# Patient Record
Sex: Female | Born: 1935 | Race: White | Hispanic: No | Marital: Single | Smoking: Never smoker
Health system: Southern US, Community
[De-identification: ages and names within clinical notes are randomized; demographics above are authoritative.]

## PROBLEM LIST (undated history)

## (undated) DIAGNOSIS — Z87442 Personal history of urinary calculi: Secondary | ICD-10-CM

## (undated) DIAGNOSIS — I10 Essential (primary) hypertension: Secondary | ICD-10-CM

## (undated) DIAGNOSIS — I7123 Aneurysm of the descending thoracic aorta, without rupture: Secondary | ICD-10-CM

## (undated) DIAGNOSIS — I82409 Acute embolism and thrombosis of unspecified deep veins of unspecified lower extremity: Secondary | ICD-10-CM

## (undated) DIAGNOSIS — I639 Cerebral infarction, unspecified: Secondary | ICD-10-CM

## (undated) DIAGNOSIS — M1990 Unspecified osteoarthritis, unspecified site: Secondary | ICD-10-CM

## (undated) DIAGNOSIS — I712 Thoracic aortic aneurysm, without rupture: Secondary | ICD-10-CM

## (undated) DIAGNOSIS — Q619 Cystic kidney disease, unspecified: Secondary | ICD-10-CM

## (undated) DIAGNOSIS — E785 Hyperlipidemia, unspecified: Secondary | ICD-10-CM

## (undated) HISTORY — DX: Cerebral infarction, unspecified: I63.9

## (undated) HISTORY — DX: Thoracic aortic aneurysm, without rupture: I71.2

## (undated) HISTORY — DX: Aneurysm of the descending thoracic aorta, without rupture: I71.23

## (undated) HISTORY — PX: JOINT REPLACEMENT: SHX530

## (undated) HISTORY — DX: Cystic kidney disease, unspecified: Q61.9

## (undated) HISTORY — PX: TONSILLECTOMY: SUR1361

## (undated) HISTORY — DX: Acute embolism and thrombosis of unspecified deep veins of unspecified lower extremity: I82.409

## (undated) HISTORY — DX: Essential (primary) hypertension: I10

## (undated) HISTORY — DX: Hyperlipidemia, unspecified: E78.5

## (undated) HISTORY — DX: Personal history of urinary calculi: Z87.442

---

## 1999-03-14 ENCOUNTER — Encounter: Admission: RE | Admit: 1999-03-14 | Discharge: 1999-03-14 | Payer: Self-pay | Admitting: Cardiothoracic Surgery

## 1999-03-14 ENCOUNTER — Encounter: Payer: Self-pay | Admitting: Cardiothoracic Surgery

## 1999-11-27 ENCOUNTER — Other Ambulatory Visit: Admission: RE | Admit: 1999-11-27 | Discharge: 1999-11-27 | Payer: Self-pay | Admitting: Family Medicine

## 2000-03-12 ENCOUNTER — Encounter: Payer: Self-pay | Admitting: Cardiothoracic Surgery

## 2000-03-12 ENCOUNTER — Encounter: Admission: RE | Admit: 2000-03-12 | Discharge: 2000-03-12 | Payer: Self-pay | Admitting: Cardiothoracic Surgery

## 2000-12-06 ENCOUNTER — Other Ambulatory Visit: Admission: RE | Admit: 2000-12-06 | Discharge: 2000-12-06 | Payer: Self-pay | Admitting: Family Medicine

## 2001-03-18 ENCOUNTER — Encounter: Payer: Self-pay | Admitting: Cardiothoracic Surgery

## 2001-03-18 ENCOUNTER — Encounter: Admission: RE | Admit: 2001-03-18 | Discharge: 2001-03-18 | Payer: Self-pay | Admitting: Cardiothoracic Surgery

## 2001-05-30 ENCOUNTER — Encounter: Payer: Self-pay | Admitting: Family Medicine

## 2001-05-30 ENCOUNTER — Ambulatory Visit: Admission: RE | Admit: 2001-05-30 | Discharge: 2001-05-30 | Payer: Self-pay | Admitting: Family Medicine

## 2001-12-15 ENCOUNTER — Other Ambulatory Visit: Admission: RE | Admit: 2001-12-15 | Discharge: 2001-12-15 | Payer: Self-pay | Admitting: Family Medicine

## 2002-02-27 ENCOUNTER — Encounter: Payer: Self-pay | Admitting: Emergency Medicine

## 2002-02-27 ENCOUNTER — Emergency Department (HOSPITAL_COMMUNITY): Admission: EM | Admit: 2002-02-27 | Discharge: 2002-02-27 | Payer: Self-pay | Admitting: Emergency Medicine

## 2002-03-24 ENCOUNTER — Ambulatory Visit (HOSPITAL_COMMUNITY): Admission: RE | Admit: 2002-03-24 | Discharge: 2002-03-24 | Payer: Self-pay | Admitting: Cardiothoracic Surgery

## 2002-03-24 ENCOUNTER — Encounter: Payer: Self-pay | Admitting: Cardiothoracic Surgery

## 2002-12-22 ENCOUNTER — Other Ambulatory Visit: Admission: RE | Admit: 2002-12-22 | Discharge: 2002-12-22 | Payer: Self-pay | Admitting: Family Medicine

## 2003-03-09 ENCOUNTER — Encounter: Admission: RE | Admit: 2003-03-09 | Discharge: 2003-03-09 | Payer: Self-pay | Admitting: Cardiothoracic Surgery

## 2003-12-24 ENCOUNTER — Other Ambulatory Visit: Admission: RE | Admit: 2003-12-24 | Discharge: 2003-12-24 | Payer: Self-pay | Admitting: Family Medicine

## 2004-03-21 ENCOUNTER — Encounter: Admission: RE | Admit: 2004-03-21 | Discharge: 2004-03-21 | Payer: Self-pay | Admitting: Cardiothoracic Surgery

## 2004-12-26 ENCOUNTER — Other Ambulatory Visit: Admission: RE | Admit: 2004-12-26 | Discharge: 2004-12-26 | Payer: Self-pay | Admitting: Family Medicine

## 2005-03-20 ENCOUNTER — Encounter: Admission: RE | Admit: 2005-03-20 | Discharge: 2005-03-20 | Payer: Self-pay | Admitting: Cardiothoracic Surgery

## 2006-04-02 ENCOUNTER — Encounter: Admission: RE | Admit: 2006-04-02 | Discharge: 2006-04-02 | Payer: Self-pay | Admitting: Cardiothoracic Surgery

## 2007-01-11 ENCOUNTER — Other Ambulatory Visit: Admission: RE | Admit: 2007-01-11 | Discharge: 2007-01-11 | Payer: Self-pay | Admitting: Family Medicine

## 2007-03-18 ENCOUNTER — Ambulatory Visit: Payer: Self-pay | Admitting: Cardiothoracic Surgery

## 2007-03-18 ENCOUNTER — Encounter: Admission: RE | Admit: 2007-03-18 | Discharge: 2007-03-18 | Payer: Self-pay | Admitting: Cardiothoracic Surgery

## 2007-11-10 ENCOUNTER — Ambulatory Visit: Payer: Self-pay | Admitting: Internal Medicine

## 2007-11-10 DIAGNOSIS — R21 Rash and other nonspecific skin eruption: Secondary | ICD-10-CM | POA: Insufficient documentation

## 2007-11-10 LAB — CONVERTED CEMR LAB
ALT: 27 units/L (ref 0–35)
AST: 26 units/L (ref 0–37)
Albumin: 4 g/dL (ref 3.5–5.2)
Alkaline Phosphatase: 61 units/L (ref 39–117)
Anti Nuclear Antibody(ANA): NEGATIVE
BUN: 22 mg/dL (ref 6–23)
Basophils Absolute: 0.2 10*3/uL — ABNORMAL HIGH (ref 0.0–0.1)
Basophils Relative: 3.1 % — ABNORMAL HIGH (ref 0.0–1.0)
Bilirubin, Direct: 0.1 mg/dL (ref 0.0–0.3)
CO2: 26 meq/L (ref 19–32)
Calcium: 9.4 mg/dL (ref 8.4–10.5)
Chloride: 105 meq/L (ref 96–112)
Creatinine, Ser: 0.8 mg/dL (ref 0.4–1.2)
Eosinophils Absolute: 0.3 10*3/uL (ref 0.0–0.7)
Eosinophils Relative: 5.6 % — ABNORMAL HIGH (ref 0.0–5.0)
GFR calc Af Amer: 91 mL/min
GFR calc non Af Amer: 75 mL/min
Glucose, Bld: 99 mg/dL (ref 70–99)
HCT: 42.2 % (ref 36.0–46.0)
Hemoglobin: 14.9 g/dL (ref 12.0–15.0)
IgE (Immunoglobulin E), Serum: 7.2 intl units/mL (ref 0.0–180.0)
Lymphocytes Relative: 26.9 % (ref 12.0–46.0)
MCHC: 35.4 g/dL (ref 30.0–36.0)
MCV: 94.1 fL (ref 78.0–100.0)
Monocytes Absolute: 0.4 10*3/uL (ref 0.1–1.0)
Monocytes Relative: 7.1 % (ref 3.0–12.0)
Neutro Abs: 3.6 10*3/uL (ref 1.4–7.7)
Neutrophils Relative %: 57.3 % (ref 43.0–77.0)
Platelets: 279 10*3/uL (ref 150–400)
Potassium: 4.1 meq/L (ref 3.5–5.1)
RBC: 4.48 M/uL (ref 3.87–5.11)
RDW: 12.3 % (ref 11.5–14.6)
Rheumatoid fact SerPl-aCnc: <20 intl units/mL — ABNORMAL LOW (ref 0.0–20.0)
Sed Rate: 6 mm/hr (ref 0–22)
Sodium: 141 meq/L (ref 135–145)
TSH: 1.23 microintl units/mL (ref 0.35–5.50)
Total Bilirubin: 1.1 mg/dL (ref 0.3–1.2)
Total Protein: 6.8 g/dL (ref 6.0–8.3)
WBC: 6.1 10*3/uL (ref 4.5–10.5)

## 2007-11-17 DIAGNOSIS — I10 Essential (primary) hypertension: Secondary | ICD-10-CM | POA: Insufficient documentation

## 2007-11-17 DIAGNOSIS — E785 Hyperlipidemia, unspecified: Secondary | ICD-10-CM | POA: Insufficient documentation

## 2007-11-17 DIAGNOSIS — J984 Other disorders of lung: Secondary | ICD-10-CM | POA: Insufficient documentation

## 2007-12-01 ENCOUNTER — Ambulatory Visit: Payer: Self-pay | Admitting: Internal Medicine

## 2007-12-01 LAB — CONVERTED CEMR LAB: IgE (Immunoglobulin E), Serum: 7.1 intl units/mL (ref 0.0–180.0)

## 2008-01-02 ENCOUNTER — Ambulatory Visit: Payer: Self-pay | Admitting: Internal Medicine

## 2008-01-07 ENCOUNTER — Emergency Department (HOSPITAL_COMMUNITY): Admission: EM | Admit: 2008-01-07 | Discharge: 2008-01-07 | Payer: Self-pay | Admitting: Emergency Medicine

## 2008-03-15 ENCOUNTER — Ambulatory Visit: Payer: Self-pay | Admitting: Internal Medicine

## 2008-03-30 ENCOUNTER — Encounter: Admission: RE | Admit: 2008-03-30 | Discharge: 2008-03-30 | Payer: Self-pay | Admitting: Family Medicine

## 2008-05-03 ENCOUNTER — Ambulatory Visit: Payer: Self-pay | Admitting: Internal Medicine

## 2008-11-21 ENCOUNTER — Encounter: Admission: RE | Admit: 2008-11-21 | Discharge: 2008-11-21 | Payer: Self-pay | Admitting: Cardiothoracic Surgery

## 2008-12-06 ENCOUNTER — Ambulatory Visit: Payer: Self-pay | Admitting: Cardiothoracic Surgery

## 2008-12-10 ENCOUNTER — Ambulatory Visit: Payer: Self-pay | Admitting: Surgery

## 2009-11-20 ENCOUNTER — Other Ambulatory Visit: Admission: RE | Admit: 2009-11-20 | Discharge: 2009-11-20 | Payer: Self-pay | Admitting: Family Medicine

## 2010-06-03 NOTE — Assessment & Plan Note (Signed)
Summary: rov 3 wks////kp   Visit Type:  Follow-up PCP:  Elias Else  Chief Complaint:  follow up visit .  History of Present Illness: Current Problems:  HYPERTENSION (ICD-401.9) HYPERLIPIDEMIA (ICD-272.4) RASH AND OTHER NONSPECIFIC SKIN ERUPTION (ICD-782.1) LUNG NODULE (ICD-43.70)  77 year old woman returning for follow-up, history of lung nodule, history of rash.  The rash is improved.  We reviewed her previous experience with dermatologist.  She never had biopsy.  Says pruritus has now moved from her abdomen to her back.  Sample medications given last time here were of no help.  She avoids moisturizers, uses baby detergents, with no fabric dryer sheets.  Sometimes she does much better for months at a time, but does not recognize any seasonal or temperature effect.  We reviewed medications. Labs: EOS incr 5.6%, chem NL, Sed Rate NL, Rheumatoid factor NL, TSH 1.23(OK).       Prior Medications Reviewed Using: Patient Recall  Updated Prior Medication List: NIACIN 250 MG  TABS (NIACIN) take 1 by mouth once daily ONE-A-DAY 50 PLUS   TABS (MULTIPLE VITAMINS-MINERALS) take 1 by mouth once daily CALCIUM 500 MG  TABS (CALCIUM CARBONATE) take 1 by mouth once daily BENICAR 20 MG  TABS (OLMESARTAN MEDOXOMIL) take 1 by mouth once daily COMBIGAN 0.2-0.5 %  SOLN (BRIMONIDINE TARTRATE-TIMOLOL) Use as directed TRAVATAN 0.004 %  SOLN (TRAVOPROST) Use as directed  Current Allergies (reviewed today): ! PCN  Past Medical History:    Reviewed history from 11/10/2007 and no changes required:       Shingles       kidney stones       Hyperlipidemia       seasonal rhinitis       Hypertension     Review of Systems      See HPI   Vital Signs:  Patient Profile:   77 Years Old Female Weight:      128.25 pounds O2 Sat:      100 % O2 treatment:    Room Air Pulse rate:   54 / minute BP sitting:   104 / 60  (left arm) Cuff size:   regular  Vitals Entered By: Reynaldo Minium CMA (December 01, 2007 10:21 AM)                 Physical Exam  GENERAL:  A/Ox3; pleasant & cooperative.NAD HEENT:  [place]/AT, EOM-wnl, PERRLA, EACs-clear, TMs-wnl, NOSE-clear, THROAT-clear & wnl. NECK:  Supple w/ fair ROM; no JVD; normal carotid impulses w/o bruits; no thyromegaly or nodules palpated; no lymphadenopathy. CHEST: Clear to P&A HEART:  RRR, no m/r/g  heard ABDOMEN:  Soft & nt; nml bowel sounds; no organomegaly or masses detected. EXT: Warm bilat,  no calf pain, edema, clubbing, pulses intact Skin:Small areas of erythema and thckening on her back, look excoriated.        Impression & Recommendations:  Problem # 1:  RASH AND OTHER NONSPECIFIC SKIN ERUPTION (ICD-782.1)  Nonspecific rash, possible nummular eczema or atopic dermatitis. Suggested she see a dermatologist. Try Cetaphil lotion.   "RAST" (Allergy Full Profile) IGE ([id number]) T- * Misc. Laboratory test [phone]) "RAST" (Allergy Full Profile) IGE [phone]) T- * Misc. Laboratory test [phone]) "RAST" (Allergy Full Profile) IGE [phone]) T- * Misc. Laboratory test [phone]) "RAST" (Allergy Full Profile) IGE [phone]) T- * Misc. Laboratory test [phone]) "RAST" (Allergy Full Profile) IGE [phone]) T- * Misc. Laboratory test [phone]) "RAST" (Allergy Full Profile) IGE [phone]) T- * Misc. Laboratory test [phone]) "RAST" (Allergy Full Profile)  IGE [phone]) T- * Misc. Laboratory test [phone]) "RAST" (Allergy Full Profile) IGE [phone]) T- * Misc. Laboratory test [phone]) "RAST" (Allergy Full Profile) IGE [phone]) T- * Misc. Laboratory test [phone]) "RAST" (Allergy Full Profile) IGE [phone]) T- * Misc. Laboratory test [phone])   Problem # 2:  LUNG NODULE (ICD-518.89) Dr. Donata Clay is continuing to follow this issue.   Patient Instructions: 1)  Please schedule a follow-up appointment in 1 month. 2)  Labs    ]  Appended Document: medical history update        Current Allergies: !  PCN   Family History:    mother died at 62 from cancer    father was healthy    1 brother who died at 63 from heart and kidney failure  Social History:    Patient never smoked.     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

## 2010-06-03 NOTE — Assessment & Plan Note (Signed)
Summary: rov 1 month///kp   PCP:  Elias Else  Chief Complaint:  1 month follow up visit-broke out again last week; currently still broke out.Marland Kitchen  History of Present Illness: Current Problems:  HYPERTENSION (ICD-401.9) HYPERLIPIDEMIA (ICD-272.4) RASH AND OTHER NONSPECIFIC SKIN ERUPTION (ICD-782.1) LUNG NODULE (ICD-518.89)  03/15/08- From 01/01/08- 77 year old woman returning for follow-up with a skin rash, questioning, allergy.  The lung nodule is being followed by thoracic surgery.  Her eye doctor said she was allergic to her glaucoma drops and made a change.  There is less burning and itching of her eyes now.  Less redness. Lab: In vitro test /RAST was negative.  IgE was 7.1.  These argue against common inhalant allergy.  03/15/08-Rash x 2 yrs. Intermittent, not related to her eye drops. Did not try changing   Benicar. Had flu vax.Denies cough, wheeze, sneeze.  05/03/08- Rash Was better and happy, with rash clearing duing time she was on Diovan. Now back on Benicar about 3 weeks. Rash started again in the last week. Had a cold around 3 weeks ago. Treating dry skin using Aveeno as a body wash.       Prior Medication List:  NIACIN 250 MG  TABS (NIACIN) take 1 by mouth once daily ONE-A-DAY 50 PLUS   TABS (MULTIPLE VITAMINS-MINERALS) take 1 by mouth once daily CALCIUM 500 MG  TABS (CALCIUM CARBONATE) take 1 by mouth once daily BENICAR 20 MG  TABS (OLMESARTAN MEDOXOMIL) take 1 by mouth once daily TRAVATAN 0.004 %  SOLN (TRAVOPROST) Use as directed DORZOLAMIDE-TIMOLOL 2-0.5 % SOLN (DORZOLAMIDE-TIMOLOL) Instill 1 gtt each eye two times a day   Updated Prior Medication List: NIACIN 250 MG  TABS (NIACIN) take 1 by mouth once daily ONE-A-DAY 50 PLUS   TABS (MULTIPLE VITAMINS-MINERALS) take 1 by mouth once daily CALCIUM 500 MG  TABS (CALCIUM CARBONATE) take 1 by mouth once daily BENICAR 20 MG  TABS (OLMESARTAN MEDOXOMIL) take 1 by mouth once daily TRAVATAN 0.004 %  SOLN (TRAVOPROST)  Use as directed DORZOLAMIDE-TIMOLOL 2-0.5 % SOLN (DORZOLAMIDE-TIMOLOL) Instill 1 gtt each eye two times a day  Current Allergies (reviewed today): ! PCN  Past Medical History:    Reviewed history from 03/15/2008 and no changes required:       Shingles       kidney stones       Hyperlipidemia       seasonal rhinitis       Hypertension       Glaucoma       Rash   Social History:    Reviewed history from 12/12/2007 and no changes required:       Patient never smoked.        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     Vital Signs:  Patient Profile:   77 Years Old Female Weight:      122.25 pounds O2 Sat:      100 % O2 treatment:    Room Air Pulse rate:   63 / minute BP sitting:   116 / 62  (left arm) Cuff size:   regular  Vitals Entered By: Reynaldo Minium  CMA (May 03, 2008 10:37 AM)             Comments Medications reviewed with patient Reynaldo Minium CMA  May 03, 2008 10:37 AM      Physical Exam  General: A/Ox3; pleasant and cooperative, NAD, SKIN: Dry skin with excoriation but no additional process visible NODES: no lymphadenopathy HEENT: [place]/AT, EOM- WNL, Conjuctivae- clear, PERRLA, TM-WNL, Nose- clear, Throat- clear and wnl NECK: Supple w/ fair ROM, JVD- none, normal carotid impulses w/o bruits Thyroid- normal to palpation CHEST: Clear to P&A HEART: RRR, no m/g/r heard ABDOMEN: Soft and nl; nml bowel sounds; no organomegaly or masses noted AOZ:HYQM, nl pulses, no edema  NEURO: Grossly intact to observation         Impression & Recommendations:  Problem # 1:  RASH AND OTHER NONSPECIFIC SKIN ERUPTION (ICD-782.1) Some of this is dry skin/ winter itch. Off chance that she is intolerant of Benicar- will change back to Diovan.  Depo.  Problem # 2:  LUNG NODULE (ICD-518.89) Followed by thoracic surgery  Medications Added to Medication List This Visit: 1)  Diovan 160 Mg Tabs (Valsartan) .Marland Kitchen.. 1 daily instead of benicar   Patient Instructions: 1)  Please schedule a follow-up appointment as needed 2)  Script for Diovan 160 is sent to your drug store to replace Benicar. 3)  Depo 80 4)  Contiue with skin moisturizers- Aveeno   Prescriptions: DIOVAN 160 MG TABS (VALSARTAN) 1 daily instead of Benicar  #30 x 5   Entered and Authorized by:   Waymon Budge MD   Signed by:   Waymon Budge MD on 05/03/2008   Method used:   Electronically to        [facility] [phone]* (retail)       [address]       [place], [place]  [address]       Ph: [phone] or [phone]       Fax: [phone]   RxID:   [id number]  ]

## 2010-06-03 NOTE — Assessment & Plan Note (Signed)
Summary: allergies- self referral////kp   Vital Signs:  Patient Profile:   77 Years Old Female Weight:      128.50 pounds O2 Sat:      100 % O2 treatment:    Room Air Pulse rate:   74 / minute BP sitting:   122 / 76  (left arm) Cuff size:   regular  Vitals Entered By: Reynaldo Minium CMA (November 10, 2007 3:17 PM)                 Visit Type:  IME Initial PCP:  Elias Else  Chief Complaint:  allergy consult-self .  History of Present Illness: 77 year old woman self-referred for evaluation of rash.  Her primary physician is Dr. Nicholos Johns.  For the past two years, off and on, she has had rash.  Skin is clear for months at a time, but now for the past two months,  Eyelids have been red.  Three medical doctors and wto dermatologists have given her ointments, but no biopsy.  She kept a food diary.  Rash can involve neck to knees, and may be different on her body from around her eyes.  She sees no relation to food.  Using baby detergents, and taking soda baths.  No effect from Depo-Medrol or prednisone. Zyrtec did not help as an antihistamine.  She stopped some of her medicines, including niacin and Evista without effect. Itching. House: no mold, no animals, no obvious trigger and not worse or better on weekends. Works [employer] trader. has had seasonal rhinitis, never very bad. Being evaluated by Dr.VanTrigt for a lung nodule, enhancing.     Prior Medications Reviewed Using: List Brought by Patient  Updated Prior Medication List: EVISTA 60 MG  TABS (RALOXIFENE HCL) take 1 by mouth once daily NIACIN 250 MG  TABS (NIACIN) take 1 by mouth once daily ONE-A-DAY 50 PLUS   TABS (MULTIPLE VITAMINS-MINERALS) take 1 by mouth once daily CALCIUM 500 MG  TABS (CALCIUM CARBONATE) take 1 by mouth once daily BONIVA 150 MG  TABS (IBANDRONATE SODIUM) take 1 by mouth monthly BENICAR 20 MG  TABS (OLMESARTAN MEDOXOMIL) take 1 by mouth once daily COMBIGAN 0.2-0.5 %  SOLN (BRIMONIDINE TARTRATE-TIMOLOL) Use as  directed TRAVATAN 0.004 %  SOLN (TRAVOPROST) Use as directed  Current Allergies (reviewed today): ! PCN  Past Medical History:    Reviewed history and no changes required:       Shingles       kidney stones       Hyperlipidemia       seasonal rhinitis       Hypertension   Family History:    Reviewed history and no changes required:       Heart disease       cancer  Social History:    Reviewed history and no changes required:       Patient never smoked.        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  Notes sneezing, itching, rash    Physical Exam  GENERAL:  A/Ox3; pleasant & cooperative.NAD HEENT:  /AT, EOM-wnl, PERRLA, EACs-clear, TMs-wnl, NOSE-clear, THROAT-clear & wnl. NECK:  Supple w/ fair ROM; no JVD; normal carotid impulses w/o bruits; no thyromegaly  or nodules palpated; no lymphadenopathy. CHEST: Clear to P&A HEART:  RRR, no m/r/g  heard ABDOMEN:  Soft & nt; nml bowel sounds; no organomegaly or masses detected. EXT: Warm bilat,  no calf pain, edema, clubbing, pulses intact Skin: Periorbital erythema without peeling. Excoriation under breasts. Nails normal, mucosa normal     Impression & Recommendations:  Problem # 1:  RASH AND OTHER NONSPECIFIC SKIN ERUPTION (ICD-782.1) unspecific periorbital rash.  She is avoiding cosmetics, and there is no obvious exposure to contact or some light.  The rash on her trunk may be different.  It looks more like yeast or scabies, or possibly excoriation of dry skin.  It may be possible to treat part of this as eczema. plan blood work, including in vitro testing.  Samples of Xyzal, and Singulair.  "RAST" (Allergy Full Profile) IGE [phone]) "RAST" (Allergy Full Profile) IGE [phone]) "RAST" (Allergy Full Profile) IGE  ([id number]) "RAST" (Allergy Full Profile) IGE ([phone]) "RAST" (Allergy Full Profile) IGE ([phone]) "RAST" (Allergy Full Profile) IGE ([phone]) "RAST" (Allergy Full Profile) IGE ([id number]) "RAST" (Allergy Full Profile) IGE ([id number]) "RAST" (Allergy Full Profile) IGE ([id number]) "RAST" (Allergy Full Profile) IGE ([id number]) "RAST" (Allergy Full Profile) IGE ([id number]) "RAST" (Allergy Full Profile) IGE ([id number]) "RAST" (Allergy Full Profile) IGE ([phone]) "RAST" (Allergy Full Profile) IGE ([id number]) "RAST" (Allergy Full Profile) IGE ([id number]) "RAST" (Allergy Full Profile) IGE ([id number]) "RAST" (Allergy Full Profile) IGE ([id number]) "RAST" (Allergy Full Profile) IGE ([id number]) "RAST" (Allergy Full Profile) IGE ([phone]) "RAST" (Allergy Full Profile) IGE ([id number])   Medications Added to Medication List This Visit: 1)  Evista 60 Mg Tabs (Raloxifene hcl) .... Take 1 by mouth once daily 2)  Niacin 250 Mg Tabs (Niacin) .... Take 1 by mouth once daily 3)  One-a-day 50 Plus Tabs (Multiple vitamins-minerals) .... Take 1 by mouth once daily 4)  Calcium 500 Mg Tabs (Calcium carbonate) .... Take 1 by mouth once daily 5)  Boniva 150 Mg Tabs (Ibandronate sodium) .... Take 1 by mouth monthly 6)  Benicar 20 Mg Tabs (Olmesartan medoxomil) .... Take 1 by mouth once daily 7)  Combigan 0.2-0.5 % Soln (Brimonidine tartrate-timolol) .... Use as directed 8)  Travatan 0.004 % Soln (Travoprost) .... Use as directed   Patient Instructions: 1)  Please schedule a follow-up appointment in 3 weeks. 2)  Samples xyzal (7) 1 daily 3)  Sample pck singulair, one daily 4)  Labs   ]

## 2010-06-03 NOTE — Assessment & Plan Note (Signed)
Summary: rash/apc   PCP:  Elias Else  Chief Complaint:  Acute OV.  History of Present Illness: Current Problems:  HYPERTENSION (ICD-401.9) HYPERLIPIDEMIA (ICD-272.4) RASH AND OTHER NONSPECIFIC SKIN ERUPTION (ICD-782.1) LUNG NODULE (ICD-518.89)  From 01/01/08- 77 year old woman returning for follow-up with a skin rash, questioning, allergy.  The lung nodule is being followed by thoracic surgery.  Her eye doctor said she was allergic to her glaucoma drops and made a change.  There is less burning and itching of her eyes now.  Less redness. Lab: In vitro test /RAST was negative.  IgE was 7.1.  These argue against common inhalant allergy.  03/15/08-Rash x 2 yrs. Intermittent, not related to her eye drops. Did not try changing   Benicar. Had flu vax.Denies cough, wheeze, sneeze.        Prior Medications Reviewed Using: Patient Recall  Updated Prior Medication List: NIACIN 250 MG  TABS (NIACIN) take 1 by mouth once daily ONE-A-DAY 50 PLUS   TABS (MULTIPLE VITAMINS-MINERALS) take 1 by mouth once daily CALCIUM 500 MG  TABS (CALCIUM CARBONATE) take 1 by mouth once daily BENICAR 20 MG  TABS (OLMESARTAN MEDOXOMIL) take 1 by mouth once daily TRAVATAN 0.004 %  SOLN (TRAVOPROST) Use as directed DORZOLAMIDE-TIMOLOL 2-0.5 % SOLN (DORZOLAMIDE-TIMOLOL) Instill 1 gtt each eye two times a day  Current Allergies (reviewed today): ! PCN  Past Medical History:    Reviewed history from 01/02/2008 and no changes required:       Shingles       kidney stones       Hyperlipidemia       seasonal rhinitis       Hypertension       Glaucoma       Rash   Social History:    Reviewed history from 12/12/2007 and no changes required:       Patient never smoked.        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  Onset of rash x 2 years ago, comes and goes.  Rash all over torso to ankles.  C/O itching. Medications reviewed Cloyde Reams RN  March 15, 2008 1:57 PM      Physical Exam  General: A/Ox3; pleasant and cooperative, NAD, SKIN: excoriated generalized mac/pap rash across arms and back. NODES: no lymphadenopathy HEENT: [place]/AT, EOM- WNL, Conjuctivae- clear, PERRLA, TM-WNL, Nose- clear, Throat- clear and wnl NECK: Supple w/ fair ROM, JVD- none, normal carotid impulses w/o bruits Thyroid- normal to palpation CHEST: Clear to P&A HEART: RRR, no m/g/r heard ABDOMEN: Soft and nl; nml bowel sounds; no organomegaly or masses noted ZOX:WRUE, nl pulses, no edema  NEURO: Grossly intact to observation  Impression & Recommendations:  Problem # 1:  RASH AND OTHER NONSPECIFIC SKIN ERUPTION (ICD-782.1) Nonspecific rash, possibly drug related. Will give samples Diovan to replace Benicar for trial. Otherwise will need to talk to primary MD about change to diffrerent drug class.  Medications Added to Medication List This Visit: 1)  Dorzolamide-timolol 2-0.5 % Soln (Dorzolamide-timolol) .... Instill 1 gtt each eye two times a day   Patient Instructions: 1)  Please schedule a follow-up appointment in 1 month. 2)  Try samples Diovan 160, one daily for two weeks, instead of Benicar, to see if the change helps your rash. if it does, then ask Dr. Nicholos Johns to switch you.   ]

## 2010-06-03 NOTE — Assessment & Plan Note (Signed)
Summary: rov 1 month///kp   Visit Type:  Follow-up PCP:  Elias Else   History of Present Illness: Current Problems:  HYPERTENSION (ICD-401.9) HYPERLIPIDEMIA (ICD-272.4) RASH AND OTHER NONSPECIFIC SKIN ERUPTION (ICD-782.1) LUNG NODULE (ICD-48.36)  77 year old woman returning for follow-up with a skin rash, questioning, allergy.  The lung nodule is being followed by thoracic surgery.  Her eye doctor said she was allergic to her glaucoma drops and made a change.  There is less burning and itching of her eyes now.  Less redness. Lab: In vitro test/RAST was negative.  IgE was 7.1.  These argue against common inhalant allergy.       Current Allergies: ! PCN  Past Medical History:    Reviewed history from 11/10/2007 and no changes required:       Shingles       kidney stones       Hyperlipidemia       seasonal rhinitis       Hypertension       Glaucoma   Social History:    Reviewed history from 12/12/2007 and no changes required:       Patient never smoked.        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[employer]       no etoh       single       no children    Review of Systems      See HPI   Vital Signs:  Patient Profile:   77 Years Old Female Weight:      127 pounds O2 Sat:      99 % O2 treatment:    Room Air Pulse rate:   77 / minute BP sitting:   108 / 74  (left arm) Cuff size:   regular  Vitals Entered ByMarinus Maw (January 02, 2008 2:52 PM)             Comments Medications reviewed with patient Marinus Maw  January 02, 2008 2:55 PM      Physical Exam  General: A/Ox3; pleasant and cooperative, NAD, SKIN: fading macular rash on trunk, no excoriation NODES: no lymphadenopathy HEENT: [place]/AT, EOM- WNL, Conjuctivae- mildly injected, PERRLA, TM-WNL, Nose- clear, Throat- clear and wnl NECK: Supple w/ fair ROM, JVD- none, normal carotid impulses w/o bruits Thyroid- normal to palpation CHEST: Clear to P&A HEART: RRR, no  m/g/r heard ABDOMEN: Soft and nl; nml bowel sounds; no organomegaly or masses noted ZOX:WRUE, nl pulses, no edema  NEURO: Grossly intact to observation         Impression & Recommendations:  Problem # 1:  RASH AND OTHER NONSPECIFIC SKIN ERUPTION (ICD-782.1) periorbital and generalized rash.  Conjunctiva look irritation may have been separate, related to eye drops.  She is getting better.  We discussed available interventions, and finally decided to try Depo-Medrol to see whether this would create a permanent She will continue also to follow with her opthalmologist.  Problem # 2:  LUNG NODULE (ICD-518.89) Being followed by Dr. Maren Beach  Medications Added to Medication List This Visit: 1)  Cosopt 2-0.5 % Soln (Dorzolamide-timolol) .... One drop in each eye two times a day   Patient Instructions: 1)  Please schedule a follow-up appointment as needed 2)  Depo 80 3)  Follow up with Dr Maren Beach and with your eye doctor.   ]

## 2010-09-16 NOTE — Assessment & Plan Note (Signed)
OFFICE VISIT   Tracey Holmes, Tracey Holmes A  DOB:  [birth year]                                        March 18, 2007  CHART #:  [id number]   CURRENT PROBLEMS:  1. 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  2. Hypertension.  3. History of kidney stones.  4. Hyperlipidemia.   HISTORY OF PRESENT ILLNESS:  Ms. Virgo is a 77 year old nonsmoker who  I have been following for 9 years with an abnormality on the descending  thoracic aorta by a prior CT scan.  This has been stable and by CT  scans, measures approximately 2 cm and was felt to be a false aneurysm  or penetrating ulcer.  Her ascending aorta is approximately 3.5 cm in  diameter and the remainder of her aorta is entirely normal.  This year  instead of an annual CT angiogram, a MRA was performed.  Interestingly,  this shows the abnormality to be anatomically separate from the aorta,  thus more consistent with a soft tissue density rather than an actual  pathology of the aorta, such as a penetrating ulcer or pseudoaneurysm.  The patient is completely asymptomatic and feels well.  She has not had  any medical changes since her office visit one year ago.  She takes  lisinopril and aspirin, niacin, Evista.   PHYSICAL EXAMINATION:  Blood pressure 120/70, pulse 74, respirations 18,  saturation 98%.  She is pleasant and alert.  Breath sounds are clear.  Peripheral pulses are all intact.  Cardiac examination is regular  without murmur and neurologic examination is intact.   Her MRA is reviewed with the patient and the enhanced nodular density  adjacent to the aorta is noted and by report from radiologist, is not  aortic pathology, but an adjacent soft tissue density.  This has been  stable over 9 years and is not increasing in size and is probably a  benign mass.   PLAN:  We will plan on following this, but at yearly  intervals.  This  will be communicated to the patient.   Kerin Perna, M.D.  Electronically Signed   PV/MEDQ  D:  03/18/2007  T:  03/19/2007  Job:  [id number]   cc:   Molly Maduro A. Nicholos Johns, M.D.

## 2010-09-16 NOTE — Assessment & Plan Note (Signed)
OFFICE VISIT   INGHRAM, Litzy A  DOB:  1933/07/28                                        December 06, 2008  CHART #:  [id number]   CURRENT PROBLEMS:  1. A 1.5-cm enhancing nodular density adjacent to and posterior to the      proximal descending thoracic aorta, not consistent with aneurysm by      MRA, stable in size since 2004.  2. Hypertension.  3. History of kidney stones and cystic kidney disease by CT scan in      November 2009.  4. Dyslipidemia.   PRESENT ILLNESS:  The patient is a 77 year old Caucasian nonsmoker, who  has been followed with CT and now MRI studies of the thoracic aorta for  a abnormality noted on scan in 2004.  The CT angiograms initially were  felt to represent a pseudoaneurysm or penetrating ulcer on MRI study.  This was proven to be a density separate from the thoracic aorta with  some enhancement, but probable lymph node tissue.  There has been no  evidence of enlargement of this area over the years.  Her ascending  aorta is measured less than 4 cm and is 4.1 cm today.  The lung fields  are clear and there is no pleural effusion.  She denies any significant  chest or back pain, but she does have some kyphosis and osteoporosis of  her spine.  She takes Norvasc and vitamins, and she takes eye drops for  glaucoma.   Recently she has had difficulty with transient visual changes and some  inflammation in the right eye.  Her ophthalmologist has queried whether  she has had carotid duplex scans to evaluate for atherosclerotic  nephrotic disease and possible retinal atheroembolic problems.  From my  records, she has not had the studies and we will arrange for a duplex  carotid scan within the next 4 weeks.  She does take aspirin 81 mg a  day.   PHYSICAL EXAMINATION:  VITAL SIGNS:  Blood pressure 120/70, pulse 60,  respirations 18, and saturation 98%.  GENERAL:  She is alert and appropriate.  NECK:  Carotid pulses are 2+ without  bruit.  There is no adenopathy in  the neck.  LUNGS:  Breath sounds are clear.  CARDIAC:  Rhythm is regular without S3 gallop or murmur.  EXTREMITIES:  Peripheral pulses are intact.  NEUROLOGIC:  Nonfocal.   LABORATORY DATA:  MRI shows no change from the study 18 months  previously with enhanced nodular density adjacent to and posterior to  the distal arch.  The thoracic aorta is intact without evidence of  dilatation or aneurysm.   PLAN:  We will obtain duplex carotid scans as requested by her  ophthalmologist.  We will plan on a MRI/MRA study of the thoracic aorta  in 2 years.   Kerin Perna, M.D.  Electronically Signed   PV/MEDQ  D:  12/06/2008  T:  12/07/2008  Job:  [id number]

## 2010-09-16 NOTE — Procedures (Signed)
CAROTID DUPLEX EXAM   INDICATION:  Right eye visual disturbance.   HISTORY:  Diabetes:  No.  Cardiac:  No.  Hypertension:  Yes.  Smoking:  No.  Previous Surgery:  CV History:  Amaurosis Fugax No, Paresthesias No, Hemiparesis No.                                       RIGHT             LEFT  Brachial systolic pressure:         130               128  Brachial Doppler waveforms:         Biphasic          Biphasic  Vertebral direction of flow:        Antegrade         Antegrade  DUPLEX VELOCITIES (cm/sec)  CCA peak systolic                   65                68  ECA peak systolic                   101               88  ICA peak systolic                   76                75  ICA end diastolic                   24                22  PLAQUE MORPHOLOGY:                  None              None  PLAQUE AMOUNT:                      None              None  PLAQUE LOCATION:                    None              None   IMPRESSION:  1. Normal carotid duplex noted bilaterally.  2. Antegrade bilateral vertebral arteries.        ___________________________________________  V. Charlena Cross, MD   MG/MEDQ  D:  12/10/2008  T:  12/10/2008  Job:  [id number]

## 2010-11-17 ENCOUNTER — Other Ambulatory Visit: Payer: Self-pay | Admitting: Cardiothoracic Surgery

## 2010-11-17 DIAGNOSIS — I712 Thoracic aortic aneurysm, without rupture: Secondary | ICD-10-CM

## 2010-12-24 ENCOUNTER — Ambulatory Visit: Payer: Self-pay | Admitting: Cardiothoracic Surgery

## 2010-12-24 ENCOUNTER — Other Ambulatory Visit: Payer: Self-pay

## 2011-01-07 ENCOUNTER — Other Ambulatory Visit: Payer: Self-pay

## 2011-01-07 ENCOUNTER — Ambulatory Visit: Payer: Self-pay | Admitting: Cardiothoracic Surgery

## 2011-01-13 DIAGNOSIS — I10 Essential (primary) hypertension: Secondary | ICD-10-CM | POA: Insufficient documentation

## 2011-01-13 DIAGNOSIS — E785 Hyperlipidemia, unspecified: Secondary | ICD-10-CM | POA: Insufficient documentation

## 2011-01-13 DIAGNOSIS — Z87442 Personal history of urinary calculi: Secondary | ICD-10-CM | POA: Insufficient documentation

## 2011-01-13 DIAGNOSIS — Q619 Cystic kidney disease, unspecified: Secondary | ICD-10-CM | POA: Insufficient documentation

## 2011-01-13 DIAGNOSIS — I712 Thoracic aortic aneurysm, without rupture: Secondary | ICD-10-CM | POA: Insufficient documentation

## 2011-01-14 ENCOUNTER — Ambulatory Visit: Payer: Self-pay | Admitting: Cardiothoracic Surgery

## 2011-01-14 ENCOUNTER — Encounter: Payer: Self-pay | Admitting: Cardiothoracic Surgery

## 2011-01-14 ENCOUNTER — Ambulatory Visit
Admission: RE | Admit: 2011-01-14 | Discharge: 2011-01-14 | Disposition: A | Discharge status: Home / Self Care | Payer: Medicare Other | Source: Ambulatory Visit | Attending: Cardiothoracic Surgery | Admitting: Cardiothoracic Surgery

## 2011-01-14 ENCOUNTER — Ambulatory Visit (INDEPENDENT_AMBULATORY_CARE_PROVIDER_SITE_OTHER): Payer: Medicare Other | Admitting: Cardiothoracic Surgery

## 2011-01-14 ENCOUNTER — Other Ambulatory Visit: Payer: Self-pay

## 2011-01-14 VITALS — BP 127/80 | HR 76 | Resp 18 | Ht 64.0 in | Wt 121.0 lb

## 2011-01-14 DIAGNOSIS — I712 Thoracic aortic aneurysm, without rupture: Secondary | ICD-10-CM

## 2011-01-14 DIAGNOSIS — J9859 Other diseases of mediastinum, not elsewhere classified: Secondary | ICD-10-CM

## 2011-01-14 DIAGNOSIS — R222 Localized swelling, mass and lump, trunk: Secondary | ICD-10-CM

## 2011-01-14 MED ORDER — GADOBENATE DIMEGLUMINE 529 MG/ML IV SOLN
10.0000 mL | Freq: Once | INTRAVENOUS | Status: AC | PRN
  Administered 2011-01-14: 10 mL via INTRAVENOUS

## 2011-01-14 NOTE — Progress Notes (Signed)
HPI the patient is a 77 year old Caucasian female nonsmoker followed since 1999 for a nodular density along the posterior and superior aspect of the distal aortic arch. This was originally felt to represent a small aneurysm by CT scan however a MRI showed this to be a discrete and to be separate from the aorta and not likely to reset and aneurysm or pseudoaneurysm. This remained stable and unchanged over the years. He is asymptomatic except for some arthritis of her back. She has no significant atherosclerotic disease of the aorta and she has had no problems with peripheral rash or disease over the years. He is now being followed every 2 years with an MRI-MRA of the thoracic aorta in order to limit radiation exposure. Since her last visit years ago there've been no changes in her health other than being surgery for glaucoma. She still works for the school system. She does not smoke.   Current Outpatient Prescriptions  Medication Sig Dispense Refill  . amLODipine (NORVASC) 5 MG tablet Take 5 mg by mouth daily.       Marland Kitchen aspirin 81 MG tablet Take 81 mg by mouth daily.        Marland Kitchen glucosamine-chondroitin 500-400 MG tablet Take 1 tablet by mouth 2 (two) times daily before lunch and supper.        . niacin 250 MG tablet Take 250 mg by mouth daily with breakfast.        . pravastatin (PRAVACHOL) 40 MG tablet Take 40 mg by mouth daily.        No current facility-administered medications for this visit.   Facility-Administered Medications Ordered in Other Visits  Medication Dose Route Frequency Provider Last Rate Last Dose  . gadobenate dimeglumine (MULTIHANCE) injection 10 mL  10 mL Intravenous Once PRN Medication Radiologist   10 mL at 01/14/11 1059     Review of Systems: The patient denies weight loss fever or night sweats. She denies shortness of breath or back pain. She denies angina.  Physical Exam  Blood pressure 127/80 pulse 76 and regular weight 121 pounds and stable. ENT exam is normocephalic  pupils equal neck about JVD or mass no carotid bruit. Thorax is without deformity breath sounds clear and equal. Cardiac exam regular rhythm gallop or murmur. Extremities without tenderness or edema. Peripheral pulses are intact.   Diagnostic Tests: The MRA and MRI of the thorax is reviewed and there is persistence of a small nodular density along the posterior superior aspect of the distal aortic arch. This is unchanged likely represents either ESL adenopathy or possibly a benign neurogenic tumor.  Impression: Stable appearance of a posterior Eady style nodular density along the superior aspect of the distal aortic arch with very low index of suspicion for malignancy. Recommended followup with serial scans is recommended.   Plan: The patient returned with an MRI MRA in 2 years.

## 2011-01-14 NOTE — Patient Instructions (Signed)
Continue to control blood pressure and lipids.

## 2011-02-04 LAB — URINALYSIS, ROUTINE W REFLEX MICROSCOPIC
Bilirubin Urine: NEGATIVE
Glucose, UA: NEGATIVE
Hgb urine dipstick: NEGATIVE
Ketones, ur: NEGATIVE
Nitrite: NEGATIVE
Protein, ur: NEGATIVE
Specific Gravity, Urine: 1.014
Urobilinogen, UA: 1
pH: 6

## 2011-02-04 LAB — URINE MICROSCOPIC-ADD ON

## 2012-12-29 ENCOUNTER — Other Ambulatory Visit: Payer: Self-pay | Admitting: *Deleted

## 2012-12-29 DIAGNOSIS — I712 Thoracic aortic aneurysm, without rupture: Secondary | ICD-10-CM

## 2013-01-03 ENCOUNTER — Other Ambulatory Visit: Payer: Self-pay | Admitting: *Deleted

## 2013-01-03 DIAGNOSIS — I712 Thoracic aortic aneurysm, without rupture: Secondary | ICD-10-CM

## 2013-01-18 ENCOUNTER — Ambulatory Visit (INDEPENDENT_AMBULATORY_CARE_PROVIDER_SITE_OTHER): Payer: Medicare Other | Admitting: Cardiothoracic Surgery

## 2013-01-18 ENCOUNTER — Encounter: Payer: Self-pay | Admitting: *Deleted

## 2013-01-18 ENCOUNTER — Encounter: Payer: Self-pay | Admitting: Cardiothoracic Surgery

## 2013-01-18 ENCOUNTER — Ambulatory Visit
Admission: RE | Admit: 2013-01-18 | Discharge: 2013-01-18 | Disposition: A | Discharge status: Home / Self Care | Payer: Medicare Other | Source: Ambulatory Visit | Attending: Cardiothoracic Surgery | Admitting: Cardiothoracic Surgery

## 2013-01-18 VITALS — BP 140/64 | HR 90 | Resp 16 | Ht 64.0 in | Wt 123.0 lb

## 2013-01-18 DIAGNOSIS — M7989 Other specified soft tissue disorders: Secondary | ICD-10-CM

## 2013-01-18 DIAGNOSIS — I712 Thoracic aortic aneurysm, without rupture: Secondary | ICD-10-CM

## 2013-01-18 DIAGNOSIS — M799 Soft tissue disorder, unspecified: Secondary | ICD-10-CM

## 2013-01-18 NOTE — Progress Notes (Signed)
PCP is Lolita Patella, MD Referring Provider is Elias Else, MD  Chief Complaint  Patient presents with  . Follow-up    2 yr with MRA to assess soft tisue density adjacent to the descending thoraccic aorta    HPI: Patient returns for 2 year followup with MRI-MRA of the thoracic aorta. In 1997 patient is noted to have a nodular density approximately 1 cm adjacent to the distal thoracic aortic arch. This is felt to be a mediastinal node and has been stable over the years. The patient has hypertension. In the past 2 years since her previous MRI ascending thoracic aorta has increased in size from 4.0-4.2 cm. She has no chest pain. There's no family history of dissection or thoracic or bowel aneurysm surgery. She states she gets her blood pressure checked at least every 6 months at her primary care physician's office and she is taking her blood pressure medicine, Norvasc 5 mg a day. She is a nonsmoker and still works daily at the [employer] in [employer]. Today she has no new complaints.  Other than the slight increase in the ascending aortic diameter there is no significant findings on the thoracic MRI. Past Medical History  Diagnosis Date  . Aneurysm, descending thoracic aorta     Proximal (1.5 cm)  . HTN (hypertension)   . Personal history of kidney stones   . Cystic kidney disease   . Dyslipidemia     No past surgical history on file.  No family history on file.  Social History History  Substance Use Topics  . Smoking status: Never Smoker   . Smokeless tobacco: Never Used  . Alcohol Use: No    Current Outpatient Prescriptions  Medication Sig Dispense Refill  . amLODipine (NORVASC) 5 MG tablet Take 5 mg by mouth daily.       Marland Kitchen aspirin 81 MG tablet Take 81 mg by mouth daily.        . Cholecalciferol (VITAMIN D) 2000 UNITS tablet Take 4,000 Units by mouth daily.      Marland Kitchen ibandronate (BONIVA) 150 MG tablet Take 150 mg by mouth every 30 (thirty) days.  Take in the morning with a full glass of water, on an empty stomach, and do not take anything else by mouth or lie down for the next 30 min.      . Multiple Vitamin (MULTIVITAMIN WITH MINERALS) TABS tablet Take 1 tablet by mouth daily.      . niacin 250 MG tablet Take 250 mg by mouth daily with breakfast.        . pravastatin (PRAVACHOL) 40 MG tablet Take 40 mg by mouth daily.        No current facility-administered medications for this visit.    Allergies  Allergen Reactions  . Penicillins     Review of Systems no chest pain no shortness of breath She fell earlier last summer breaking her right humerus which was treated nonoperatively and she finished a course of physical therapy  BP 140/64  Pulse 90  Resp 16  Ht 5\' 4"  (1.626 m)  Wt 123 lb (55.792 kg)  BMI 21.1 kg/m2  SpO2 97% Physical Exam Alert and comfortable HEENT normocephalic Neck without JVD mass or adenopathy Sounds clear Cardiac rhythm regular without murmur or gallop No pedal edema  Diagnostic Tests: MRI the thoracic aorta reviewed The 1 cm nodular density adjacent to the arch is stable and unchanged The diameter of the ascending aorta is increased 4.0-4.2 cm over 2  years.  Impression: Slight enlargement of the ascending aorta with history of hypertension. Blood pressure well controlled on Norvasc 5 mg daily. Patient instructed to check her blood pressure ideally every 4 weeks  Plan: Return with MRI of the thoracic aorta and one year to assess diameter of the ascending aorta. Patient understands that surgery is not recommended until the ascending aorta diameter is greater than 5 cm.

## 2013-02-24 ENCOUNTER — Other Ambulatory Visit: Payer: Self-pay | Admitting: Family Medicine

## 2013-02-24 ENCOUNTER — Ambulatory Visit
Admission: RE | Admit: 2013-02-24 | Discharge: 2013-02-24 | Disposition: A | Discharge status: Home / Self Care | Payer: Medicare Other | Source: Ambulatory Visit | Attending: Family Medicine | Admitting: Family Medicine

## 2013-02-24 DIAGNOSIS — R079 Chest pain, unspecified: Secondary | ICD-10-CM

## 2013-02-24 DIAGNOSIS — R05 Cough: Secondary | ICD-10-CM

## 2013-02-24 DIAGNOSIS — R059 Cough, unspecified: Secondary | ICD-10-CM

## 2013-12-15 ENCOUNTER — Other Ambulatory Visit: Payer: Self-pay | Admitting: *Deleted

## 2013-12-15 DIAGNOSIS — R222 Localized swelling, mass and lump, trunk: Secondary | ICD-10-CM

## 2013-12-15 DIAGNOSIS — I712 Thoracic aortic aneurysm, without rupture, unspecified: Secondary | ICD-10-CM

## 2013-12-20 ENCOUNTER — Other Ambulatory Visit: Payer: Self-pay | Admitting: *Deleted

## 2013-12-20 DIAGNOSIS — R911 Solitary pulmonary nodule: Secondary | ICD-10-CM

## 2014-01-29 ENCOUNTER — Other Ambulatory Visit: Payer: Self-pay | Admitting: *Deleted

## 2014-01-29 DIAGNOSIS — R911 Solitary pulmonary nodule: Secondary | ICD-10-CM

## 2014-01-29 LAB — BUN: BUN: 14 mg/dL (ref 6–23)

## 2014-01-29 LAB — CREATININE, SERUM: Creat: 0.6 mg/dL (ref 0.50–1.10)

## 2014-01-31 ENCOUNTER — Encounter: Payer: Self-pay | Admitting: Cardiothoracic Surgery

## 2014-01-31 ENCOUNTER — Ambulatory Visit (INDEPENDENT_AMBULATORY_CARE_PROVIDER_SITE_OTHER): Payer: Medicare Other | Admitting: Cardiothoracic Surgery

## 2014-01-31 ENCOUNTER — Ambulatory Visit
Admission: RE | Admit: 2014-01-31 | Discharge: 2014-01-31 | Disposition: A | Discharge status: Home / Self Care | Payer: Medicare Other | Source: Ambulatory Visit | Attending: Cardiothoracic Surgery | Admitting: Cardiothoracic Surgery

## 2014-01-31 VITALS — BP 145/88 | HR 83 | Ht 64.0 in | Wt 123.0 lb

## 2014-01-31 DIAGNOSIS — R222 Localized swelling, mass and lump, trunk: Secondary | ICD-10-CM

## 2014-01-31 DIAGNOSIS — I712 Thoracic aortic aneurysm, without rupture, unspecified: Secondary | ICD-10-CM

## 2014-01-31 DIAGNOSIS — M799 Soft tissue disorder, unspecified: Secondary | ICD-10-CM

## 2014-01-31 DIAGNOSIS — M7989 Other specified soft tissue disorders: Secondary | ICD-10-CM

## 2014-01-31 DIAGNOSIS — I77810 Thoracic aortic ectasia: Secondary | ICD-10-CM

## 2014-01-31 MED ORDER — GADOBENATE DIMEGLUMINE 529 MG/ML IV SOLN
11.0000 mL | Freq: Once | INTRAVENOUS | Status: AC | PRN
  Administered 2014-01-31: 11 mL via INTRAVENOUS

## 2014-01-31 NOTE — Progress Notes (Signed)
PCP is Lolita PatellaEADE,ROBERT ALEXANDER, MD Referring Provider is Elias Elseeade, Robert, MD  Chief Complaint  Patient presents with  . F/U THORACIC    1 YR F/U VISIT MRI    HPI: Patient returns for scheduling review of her thoracic aorta. She has a history of hypertension and mild fusiform dilatation of the ascending aorta. Her last scan 2014 at a diameter of 4.2 cm. She has no symptoms. Her previous scans also showed a 1.5 cm density posterior to the distal arch which is also been stable for several years and is felt to be a lymph node by radiology. She checks her blood pressure home and is still working at [employer] in [place].  MRA of her thoracic aorta shows a smooth fusiform dilatation of the maximal diameter 4.2 cm. The small density in the distal arch is stable. No other new abnormalities.  The patient states earlier this year her sister died of an acute aortic dissection. She had hypertension and was a smoker.   Past Medical History  Diagnosis Date  . Aneurysm, descending thoracic aorta     Proximal (1.5 cm)  . HTN (hypertension)   . Personal history of kidney stones   . Cystic kidney disease   . Dyslipidemia     No past surgical history on file.  No family history on file.  Social History History  Substance Use Topics  . Smoking status: Never Smoker   . Smokeless tobacco: Never Used  . Alcohol Use: No    Current Outpatient Prescriptions  Medication Sig Dispense Refill  . amLODipine (NORVASC) 5 MG tablet Take 5 mg by mouth daily.       Marland Kitchen. aspirin 81 MG tablet Take 81 mg by mouth daily.        . Cholecalciferol (VITAMIN D) 2000 UNITS tablet Take 4,000 Units by mouth daily.      Marland Kitchen. denosumab (PROLIA) 60 MG/ML SOLN injection Inject 60 mg into the skin every 6 (six) months. Administer in upper arm, thigh, or abdomen      . Multiple Vitamin (MULTIVITAMIN WITH MINERALS) TABS tablet Take 1 tablet by mouth daily.      . niacin 250 MG tablet Take 250 mg by mouth daily with  breakfast.        . pravastatin (PRAVACHOL) 40 MG tablet Take 40 mg by mouth daily.        No current facility-administered medications for this visit.    Allergies  Allergen Reactions  . Penicillins     Review of Systems doing well working full-time at [employer] and walks much as possible  BP 145/88  Pulse 83  Ht 5\' 4"  (1.626 m)  Wt 123 lb (55.792 kg)  BMI 21.10 kg/m2  SpO2 97% Physical Exam Alert and comfortable Normocephalic Neck with good carotid pulses no JVD Breath sounds clear Heart rhythm regular murmur Good peripheral pulses without pedal edema Neuro intact  Diagnostic Tests: MRA of the thoracic aorta shows mild dilatation stable at 4.2 cm diameter. Low risk for aortic dissection and surgical therapy not indicated until the diameter reaches 5.0 cm.  Impression: Continues to do well at age 80 Continue with blood pressure control, with statin, and 81 mg aspirin daily Plan: Return for repeat scan of the thoracic aorta in 16 months.

## 2015-06-27 ENCOUNTER — Other Ambulatory Visit: Payer: Self-pay | Admitting: *Deleted

## 2015-06-27 DIAGNOSIS — I712 Thoracic aortic aneurysm, without rupture, unspecified: Secondary | ICD-10-CM

## 2015-07-17 ENCOUNTER — Encounter: Payer: Self-pay | Admitting: Cardiothoracic Surgery

## 2015-07-17 ENCOUNTER — Ambulatory Visit
Admission: RE | Admit: 2015-07-17 | Discharge: 2015-07-17 | Disposition: A | Discharge status: Home / Self Care | Payer: Medicare Other | Source: Ambulatory Visit | Attending: Cardiothoracic Surgery | Admitting: Cardiothoracic Surgery

## 2015-07-17 DIAGNOSIS — I712 Thoracic aortic aneurysm, without rupture, unspecified: Secondary | ICD-10-CM

## 2015-07-17 MED ORDER — IOPAMIDOL (ISOVUE-370) INJECTION 76%
100.0000 mL | Freq: Once | INTRAVENOUS | Status: AC | PRN
  Administered 2015-07-17: 100 mL via INTRAVENOUS

## 2015-07-24 ENCOUNTER — Encounter: Payer: Self-pay | Admitting: Cardiothoracic Surgery

## 2015-07-25 ENCOUNTER — Encounter: Payer: Self-pay | Admitting: Cardiothoracic Surgery

## 2015-07-25 ENCOUNTER — Ambulatory Visit (INDEPENDENT_AMBULATORY_CARE_PROVIDER_SITE_OTHER): Payer: Medicare Other | Admitting: Cardiothoracic Surgery

## 2015-07-25 VITALS — BP 155/80 | HR 66 | Resp 20 | Ht 64.0 in | Wt 122.0 lb

## 2015-07-25 DIAGNOSIS — I77810 Thoracic aortic ectasia: Secondary | ICD-10-CM | POA: Diagnosis not present

## 2015-07-25 NOTE — Progress Notes (Signed)
PCP is Lolita PatellaEADE,ROBERT ALEXANDER, MD Referring Provider is Elias Elseeade, Robert, MD  Chief Complaint  Patient presents with  . Follow-up    18 month f/u with Chest CT  . Thoracic Aortic Aneurysm    HPI: The patient returns for scheduled followup with CT scanner chest. She has had long-standing fusiform dilatation of the ascending aorta without significant expansion on serial surveillance scans. The diameter of the ascending aorta remains at approximately 4.0 cm. She also has a vascular contrast- enhancing 1-2 cm density on the posterior aspect of the distal arch which probably represents a small pseudoaneurysm or diverticulum. This lesion as well not shown progression in size over the years with serial scans and in fact today measures 1.5 cm, less than last CT scan. The patient denies chest pain. She still works full time at the [employer] at [place]. She checks her blood pressure regularly and takes her Norvasc as prescribed. She is a nonsmoker.  Past Medical History  Diagnosis Date  . Aneurysm, descending thoracic aorta     Proximal (1.5 cm)  . HTN (hypertension)   . Personal history of kidney stones   . Cystic kidney disease   . Dyslipidemia     No past surgical history on file.  No family history on file.  Social History Social History  Substance Use Topics  . Smoking status: Never Smoker   . Smokeless tobacco: Never Used  . Alcohol Use: No    Current Outpatient Prescriptions  Medication Sig Dispense Refill  . ALPHAGAN P 0.1 % SOLN INSTILL 1 DROP INTO LEFT EYE TWICE DAILY  6  . amLODipine (NORVASC) 5 MG tablet Take 5 mg by mouth daily.     Marland Kitchen. aspirin 81 MG tablet Take 81 mg by mouth daily.      . Cholecalciferol (VITAMIN D) 2000 UNITS tablet Take 4,000 Units by mouth daily.    Marland Kitchen. denosumab (PROLIA) 60 MG/ML SOLN injection Inject 60 mg into the skin every 6 (six) months. Administer in upper arm, thigh, or abdomen    . dorzolamide-timolol (COSOPT) 22.3-6.8 MG/ML  ophthalmic solution INSTILL 1 DROP INTO LEFT EYE TWICE A DAY  11  . Multiple Vitamin (MULTIVITAMIN WITH MINERALS) TABS tablet Take 1 tablet by mouth daily.    . niacin 250 MG tablet Take 250 mg by mouth daily with breakfast.      . pravastatin (PRAVACHOL) 40 MG tablet Take 40 mg by mouth daily.      No current facility-administered medications for this visit.    Allergies  Allergen Reactions  . Penicillins     Review of Systems        Review of Systems :  [ y ] = yes, [  ] = no        General :  Weight gain [   ]    Weight loss  [   ]  Fatigue [  ]  Fever [  ]  Chills  [  ]                                Weakness  [  ]           HEENT    Headache [  ]  Dizziness [  ]  Blurred vision [  ] Glaucoma  [  ]  Nosebleeds [  ] Painful or loose teeth [  ]        Cardiac :  Chest pain/ pressure [  ]  Resting SOB [  ] exertional SOB [  ]                        Orthopnea [  ]  Pedal edema  [  ]  Palpitations [  ] Syncope/presyncope                         Paroxysmal nocturnal dyspnea [  ]         Pulmonary : cough [  ]  wheezing [  ]  Hemoptysis [  ] Sputum [  ] Snoring [  ]                              Pneumothorax [  ]  Sleep apnea [  ]        GI : Vomiting [  ]  Dysphagia [  ]  Melena  [  ]  Abdominal pain [  ] BRBPR [  ]              Heart burn [  ]  Constipation [  ] Diarrhea  [  ] Colonoscopy [   ]        GU : Hematuria [ yes ]  Dysuria [  ]  Nocturia [  ] UTI's [  ]recently has had symptoms of a left kidney stone and has passed some sand-like material in her urine        Vascular : Claudication [  ]  Rest pain [  ]  DVT [  ] Vein stripping [  ] leg ulcers [  ]                          TIA [  ] Stroke [  ]  Varicose veins [  ] positive for hypertension        NEURO :  Headaches  [  ] Seizures [  ] Vision changes [  ] Paresthesias [  ]                                       Seizures [  ]        Musculoskeletal :  Arthritis [  ] Gout  [  ]  Back pain [yes-CT  scan shows thoracic vertebral compression fracture, chronic  ]  Joint pain [  ]        Skin :  Rash [  ]  Melanoma [  ] Sores [  ]        Heme : Bleeding problems [  ]Clotting Disorders [  ] Anemia [  ]Blood Transfusion         Endocrine : Diabetes [  ] Heat or Cold intolerance [  ] Polyuria [  ]excessive thirst         Psych : Depression [  ]  Anxiety [  ]  Psych hospitalizations [  ] Memory change [  ]  BP 155/80 mmHg  Pulse 66  Resp 20  Ht  (1.626 m)  Wt 122 lb (55.339 kg)  BMI 20.93 kg/m2  SpO2 99% Physical Exam       Physical Exam  General: petit , comfortable very nice woman in no distress HEENT: Normocephalic pupils equal , dentition adequate Neck: Supple without JVD, adenopathy, or bruit Chest: Clear to auscultation, symmetrical breath sounds, no rhonchi, no tenderness             or deformity Cardiovascular: Regular rate and rhythm, no murmur, no gallop, peripheral pulses             palpable in all extremities Abdomen:  Soft, nontender, no palpable mass or organomegaly Extremities: Warm, well-perfused, no clubbing cyanosis edema or tenderness,              no venous stasis changes of the legs Rectal/GU: Deferred Neuro: Grossly non--focal and symmetrical throughout Skin: Clean and dry without rash or ulceration    Diagnostic Tests: CTA of the thoracic aorta personally reviewed in consultation. Her fusiform ascending aneurysm is unchanged remains approximately 4.0 cm in diameter.at this size the ascending aorta remains at low risk for dissection. The small vascular structure with contrast-enhancement today measures 1.5 cm less than the previous measurement. This probably represents a small diverticulum which is also low risk for basher complication.  Impression: Stable mild fusiform dilatation of the ascending aorta--- best therapy is blood pressure control and the patient understands. Stable small 1.5  cm contrast-enhanced lesion on the distal aortic arch present for several years. This also represents low risk and needs only observation. If this showed signs of enlargement percutaneous strategy with a stent graft would probably be the best option.  Plan:continue to follow her thoracic aorta with serial scans  -- she will returnin 2 years with a CT of the thoracic aorta.she will comply with her blood pressure medications and keep home records of blood pressure and provide ease to her primary care physician to provide optimal blood pressure control.   Mikey Bussing, MD [facility] [phone]

## 2015-07-30 ENCOUNTER — Encounter: Payer: Self-pay | Admitting: Cardiothoracic Surgery

## 2017-06-18 ENCOUNTER — Other Ambulatory Visit: Payer: Self-pay | Admitting: Cardiothoracic Surgery

## 2017-06-18 DIAGNOSIS — I712 Thoracic aortic aneurysm, without rupture, unspecified: Secondary | ICD-10-CM

## 2017-07-28 ENCOUNTER — Encounter: Payer: Self-pay | Admitting: Cardiothoracic Surgery

## 2017-07-28 ENCOUNTER — Ambulatory Visit
Admission: RE | Admit: 2017-07-28 | Discharge: 2017-07-28 | Disposition: A | Discharge status: Home / Self Care | Payer: Medicare Other | Source: Ambulatory Visit | Attending: Cardiothoracic Surgery | Admitting: Cardiothoracic Surgery

## 2017-07-28 ENCOUNTER — Ambulatory Visit: Payer: Medicare Other | Admitting: Cardiothoracic Surgery

## 2017-07-28 VITALS — BP 144/66 | HR 68 | Resp 20 | Ht 64.0 in | Wt 122.0 lb

## 2017-07-28 DIAGNOSIS — I712 Thoracic aortic aneurysm, without rupture, unspecified: Secondary | ICD-10-CM

## 2017-07-28 MED ORDER — IOPAMIDOL (ISOVUE-370) INJECTION 76%
75.0000 mL | Freq: Once | INTRAVENOUS | Status: AC | PRN
  Administered 2017-07-28: 75 mL via INTRAVENOUS

## 2017-07-28 NOTE — Progress Notes (Signed)
PCP is Laurann MontanaWhite, Cynthia, MD Referring Provider is Elias Elseeade, Robert, MD  Chief Complaint  Patient presents with  . Thoracic Aortic Aneurysm    2 year f/u with CTA Chest    HPI: Patient returns for scheduled visit with CTA of thoracic aorta. The patient is now 84 years old with hypertension, mild to moderate dilatation of the ascending thoracic aorta measuring 3.9-4.0 cm diameter and a stable 1 cm density on the proximal descending thoracic aorta with mixed enhancement, probably a partially healed ulcerated plaque of the aorta.  She has no symptoms of chest pain or upper back pain.  She checks her blood pressure regularly and is compliant with her amlodipine.  Today's scan shows a smooth walled fusiform ascending thoracic aneurysm stable at 4.0 cm. On the greater curvature of the distal arch-proximal descending thoracic aneurysm a 1 cm diverticulum-like density with mixed vascular enhancement is demonstrated but slightly decreased from 2 years ago.  No significant pulmonary nodules.  Since her last visit she has had no medical events other than surgery on her eyes for both cataract and glaucoma problems.  This was [place] at a [facility] [place].  She had no cardiac problems with these operations.  The patient remains active independently living and mild arthritis of her knees.  Past Medical History:  Diagnosis Date  . Aneurysm, descending thoracic aorta    Proximal (1.5 cm)  . Cystic kidney disease   . Dyslipidemia   . HTN (hypertension)   . Personal history of kidney stones     History reviewed. No pertinent surgical history.  History reviewed. No pertinent family history.  Social History Social History   Tobacco Use  . Smoking status: Never Smoker  . Smokeless tobacco: Never Used  Substance Use Topics  . Alcohol use: No  . Drug use: No    Current Outpatient Medications  Medication Sig Dispense Refill  . ALPHAGAN P 0.1 % SOLN INSTILL 1 DROP INTO LEFT EYE  TWICE DAILY  6  . amLODipine (NORVASC) 5 MG tablet Take 5 mg by mouth daily.     Marland Kitchen. aspirin 81 MG tablet Take 81 mg by mouth daily.      . Cholecalciferol (VITAMIN D) 2000 UNITS tablet Take 4,000 Units by mouth daily.    Marland Kitchen. denosumab (PROLIA) 60 MG/ML SOLN injection Inject 60 mg into the skin every 6 (six) months. Administer in upper arm, thigh, or abdomen    . dorzolamide-timolol (COSOPT) 22.3-6.8 MG/ML ophthalmic solution INSTILL 1 DROP INTO LEFT EYE TWICE A DAY  11  . Multiple Vitamin (MULTIVITAMIN WITH MINERALS) TABS tablet Take 1 tablet by mouth daily.    . niacin 250 MG tablet Take 250 mg by mouth daily with breakfast.      . pravastatin (PRAVACHOL) 40 MG tablet Take 40 mg by mouth daily.      No current facility-administered medications for this visit.     Allergies  Allergen Reactions  . Penicillins     Review of Systems  Weight stable Vision good after bilateral cataract surgery No difficulty swallowing or dental complaints No chest or upper back pain No dizziness or syncope No palpitations or angina No extremity weakness or lower extremity edema  BP (!) 144/66   Pulse 68   Resp 20   Ht 5\' 4"  (1.626 m)   Wt 122 lb (55.3 kg)   SpO2 98% Comment: RA  BMI 20.94 kg/m  Physical Exam      Exam  General- alert and comfortable    Neck- no JVD, no cervical adenopathy palpable, no carotid bruit   Lungs- clear without rales, wheezes   Cor- regular rate and rhythm, no murmur , gallop   Abdomen- soft, non-tender   Extremities - warm, non-tender, minimal edema.  Good distal pulses in both ankles   Neuro- oriented, appropriate, no focal weakness   Diagnostic Tests: CTA images personally reviewed and counseled with patient. The ascending thoracic aorta is stable at 4.0 cm, mild fusiform aneurysm low risk for dissection  Impression: Stable thoracic aortic disease.  Continue current medications including amlodipine and pravastatin and 81 mg aspirin.  Plan: Return for  rescan in 18 months.  Mikey Bussing, MD [facility] [phone]

## 2018-12-09 ENCOUNTER — Other Ambulatory Visit: Payer: Self-pay | Admitting: *Deleted

## 2018-12-09 DIAGNOSIS — I712 Thoracic aortic aneurysm, without rupture, unspecified: Secondary | ICD-10-CM

## 2019-02-01 ENCOUNTER — Ambulatory Visit
Admission: RE | Admit: 2019-02-01 | Discharge: 2019-02-01 | Disposition: A | Discharge status: Home / Self Care | Payer: Medicare Other | Source: Ambulatory Visit | Attending: Cardiothoracic Surgery | Admitting: Cardiothoracic Surgery

## 2019-02-01 ENCOUNTER — Encounter: Payer: Self-pay | Admitting: Cardiothoracic Surgery

## 2019-02-01 ENCOUNTER — Ambulatory Visit: Payer: Medicare Other | Admitting: Cardiothoracic Surgery

## 2019-02-01 ENCOUNTER — Other Ambulatory Visit: Payer: Self-pay

## 2019-02-01 DIAGNOSIS — I712 Thoracic aortic aneurysm, without rupture, unspecified: Secondary | ICD-10-CM | POA: Insufficient documentation

## 2019-02-01 NOTE — Progress Notes (Signed)
PCP is Harlan Stains, MD Referring Provider is Maury Dus, MD  Chief Complaint  Patient presents with  . Thoracic Aortic Aneurysm    18 month follow-up with Chest CT    HPI: Scheduled 34-month follow-up for an asymptomatic 4 cm fusiform ascending aneurysm.  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 She remains free of chest pain, dizziness, shortness of breath.  She checks her blood pressure at home and usually is less than 035 systolic.  She is compliant with her blood pressure medication which includes Norvasc 5 mg daily.  Her cholesterol is followed by her primary physician Dr. Harlan Stains and remains in a good range on Pravachol.  She takes an 81 mg aspirin daily.  CT scan today without contrast shows the aortic size remains stable at 4 cm.  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  No problems noted in the lung parenchyma or mediastinal nodes. Past Medical History:  Diagnosis Date  . Aneurysm, descending thoracic aorta    Proximal (1.5 cm)  . Cystic kidney disease   . Dyslipidemia   . HTN (hypertension)   . Personal history of kidney stones     History reviewed. No pertinent surgical history.  History reviewed. No pertinent family history.  Social History Social History   Tobacco Use  . Smoking status: Never Smoker  . Smokeless tobacco: Never Used  Substance Use Topics  . Alcohol use: No  . Drug use: No    Current Outpatient Medications  Medication Sig Dispense Refill  . ALPHAGAN P 0.1 % SOLN INSTILL 1 DROP INTO LEFT EYE TWICE DAILY  6  . amLODipine (NORVASC) 5 MG tablet Take 5 mg by mouth daily.     Marland Kitchen aspirin 81 MG tablet Take 81 mg by mouth daily.      . Cholecalciferol (VITAMIN D) 2000 UNITS tablet Take 4,000 Units by mouth daily.    Marland Kitchen denosumab (PROLIA) 60 MG/ML SOLN injection Inject 60 mg into the skin every 6 (six) months.  Administer in upper arm, thigh, or abdomen    . dorzolamide-timolol (COSOPT) 22.3-6.8 MG/ML ophthalmic solution INSTILL 1 DROP INTO LEFT EYE TWICE A DAY  11  . Multiple Vitamin (MULTIVITAMIN WITH MINERALS) TABS tablet Take 1 tablet by mouth daily.    . niacin 250 MG tablet Take 250 mg by mouth daily with breakfast.      . pravastatin (PRAVACHOL) 40 MG tablet Take 40 mg by mouth daily.      No current facility-administered medications for this visit.     Allergies  Allergen Reactions  . Penicillins     Review of Systems   Patient has had right eye surgery for glaucoma at a local [facility]. The patient is receiving physical therapy for some degenerative changes in her right knee due to bone density loss. She is scheduled to have a vaccine for influenza later this week. Weight has been stable and she remains independently active. She retired from working at the [employer] system December 2019  BP (!) 150/83 (BP Location: Left Arm, Patient Position: Sitting, Cuff Size: Normal)   Pulse 83   Temp 97.7 F (36.5 C)   Resp 18   Ht 5\' 3"  (1.6 m)   Wt 122 lb 12.8 oz (55.7 kg)   SpO2 96% Comment: RA  BMI 21.75 kg/m  Physical Exam  Exam    General- alert and comfortable    Neck- no JVD, no cervical adenopathy palpable, no carotid bruit   Lungs- clear without rales, wheezes   Cor- regular rate and rhythm, no murmur , gallop   Abdomen- soft, non-tender   Extremities - warm, non-tender, minimal edema   Neuro- oriented, appropriate, no focal weakness   Diagnostic Tests: Images of CT scan performed today show stable 4 cm fusiform ascending aneurysm.  The small penetrating ulcer of the descending thoracic aorta that has been stable for over 10 years also shows no change.  Impression: Stable thoracic aortic disease. She does not meet criteria for surgery. At age 85 the goal is to maintain low risk for further expansion or dissection of the thoracic  aorta.  Best therapy for her is continued blood pressure monitoring, blood pressure maintenance less than 140 mm Hg systolic and maintaining good lipid control. Plan: Return with repeat surveillance scan in 18 months without contrast.   Mikey Bussing, MD [facility] [phone]

## 2019-06-18 ENCOUNTER — Ambulatory Visit: Payer: Medicare Other | Attending: Internal Medicine

## 2019-06-18 DIAGNOSIS — Z23 Encounter for immunization: Secondary | ICD-10-CM | POA: Insufficient documentation

## 2019-06-18 NOTE — Progress Notes (Signed)
   Covid-19 Vaccination Clinic  Name:  Tracey Holmes    MRN: [id number] DOB: 1934-01-14  06/18/2019  [name] was observed post Covid-19 immunization for 15 minutes without incidence. She was provided with Vaccine Information Sheet and instruction to access the V-Safe system.   [name] was instructed to call 911 with any severe reactions post vaccine: Marland Kitchen Difficulty breathing  . Swelling of your face and throat  . A fast heartbeat  . A bad rash all over your body  . Dizziness and weakness    Immunizations Administered    Name Date Dose VIS Date Route   Pfizer COVID-19 Vaccine 06/18/2019  1:57 PM 0.3 mL 04/14/2019 Intramuscular   Manufacturer: [employer], [employer]   Lot: [id number]   NDC: [address]-1

## 2019-07-11 ENCOUNTER — Ambulatory Visit: Payer: Medicare Other | Attending: Internal Medicine

## 2019-07-11 DIAGNOSIS — Z23 Encounter for immunization: Secondary | ICD-10-CM

## 2019-07-11 NOTE — Progress Notes (Signed)
   Covid-19 Vaccination Clinic  Name:  Tracey Holmes    MRN: [id number] DOB: [birth year]  07/11/2019  [name] was observed post Covid-19 immunization for 15 minutes without incident. She was provided with Vaccine Information Sheet and instruction to access the V-Safe system.   [name] was instructed to call 911 with any severe reactions post vaccine: Marland Kitchen Difficulty breathing  . Swelling of face and throat  . A fast heartbeat  . A bad rash all over body  . Dizziness and weakness   Immunizations Administered    Name Date Dose VIS Date Route   Pfizer COVID-19 Vaccine 07/11/2019  8:20 AM 0.3 mL 04/14/2019 Intramuscular   Manufacturer: [employer], [employer]   Lot: [id number]   NDC: [address]-5

## 2019-07-12 ENCOUNTER — Ambulatory Visit: Payer: Medicare Other

## 2019-08-08 ENCOUNTER — Encounter (INDEPENDENT_AMBULATORY_CARE_PROVIDER_SITE_OTHER): Payer: Medicare Other | Admitting: Ophthalmology

## 2020-01-26 ENCOUNTER — Emergency Department (HOSPITAL_COMMUNITY): Payer: Medicare Other

## 2020-01-26 ENCOUNTER — Inpatient Hospital Stay (HOSPITAL_COMMUNITY)
Admission: EM | Admit: 2020-01-26 | Discharge: 2020-02-02 | DRG: 521 | Disposition: A | Discharge status: Home Health | Payer: Medicare Other | Attending: Family Medicine | Admitting: Family Medicine

## 2020-01-26 DIAGNOSIS — N181 Chronic kidney disease, stage 1: Secondary | ICD-10-CM | POA: Diagnosis present

## 2020-01-26 DIAGNOSIS — R4701 Aphasia: Secondary | ICD-10-CM

## 2020-01-26 DIAGNOSIS — Z880 Allergy status to penicillin: Secondary | ICD-10-CM | POA: Diagnosis not present

## 2020-01-26 DIAGNOSIS — J9811 Atelectasis: Secondary | ICD-10-CM | POA: Diagnosis not present

## 2020-01-26 DIAGNOSIS — R29708 NIHSS score 8: Secondary | ICD-10-CM | POA: Diagnosis not present

## 2020-01-26 DIAGNOSIS — Q613 Polycystic kidney, unspecified: Secondary | ICD-10-CM | POA: Diagnosis not present

## 2020-01-26 DIAGNOSIS — I712 Thoracic aortic aneurysm, without rupture: Secondary | ICD-10-CM | POA: Diagnosis present

## 2020-01-26 DIAGNOSIS — S72011A Unspecified intracapsular fracture of right femur, initial encounter for closed fracture: Secondary | ICD-10-CM | POA: Diagnosis present

## 2020-01-26 DIAGNOSIS — S72001A Fracture of unspecified part of neck of right femur, initial encounter for closed fracture: Secondary | ICD-10-CM | POA: Diagnosis not present

## 2020-01-26 DIAGNOSIS — Z7982 Long term (current) use of aspirin: Secondary | ICD-10-CM

## 2020-01-26 DIAGNOSIS — Z419 Encounter for procedure for purposes other than remedying health state, unspecified: Secondary | ICD-10-CM | POA: Diagnosis not present

## 2020-01-26 DIAGNOSIS — W19XXXA Unspecified fall, initial encounter: Secondary | ICD-10-CM

## 2020-01-26 DIAGNOSIS — I6340 Cerebral infarction due to embolism of unspecified cerebral artery: Secondary | ICD-10-CM

## 2020-01-26 DIAGNOSIS — Z79899 Other long term (current) drug therapy: Secondary | ICD-10-CM | POA: Diagnosis not present

## 2020-01-26 DIAGNOSIS — W010XXA Fall on same level from slipping, tripping and stumbling without subsequent striking against object, initial encounter: Secondary | ICD-10-CM | POA: Diagnosis present

## 2020-01-26 DIAGNOSIS — E785 Hyperlipidemia, unspecified: Secondary | ICD-10-CM | POA: Diagnosis present

## 2020-01-26 DIAGNOSIS — T189XXA Foreign body of alimentary tract, part unspecified, initial encounter: Secondary | ICD-10-CM

## 2020-01-26 DIAGNOSIS — J189 Pneumonia, unspecified organism: Secondary | ICD-10-CM

## 2020-01-26 DIAGNOSIS — I129 Hypertensive chronic kidney disease with stage 1 through stage 4 chronic kidney disease, or unspecified chronic kidney disease: Secondary | ICD-10-CM | POA: Diagnosis present

## 2020-01-26 DIAGNOSIS — Z87442 Personal history of urinary calculi: Secondary | ICD-10-CM | POA: Diagnosis not present

## 2020-01-26 DIAGNOSIS — Z20822 Contact with and (suspected) exposure to covid-19: Secondary | ICD-10-CM | POA: Diagnosis present

## 2020-01-26 DIAGNOSIS — I6389 Other cerebral infarction: Secondary | ICD-10-CM | POA: Diagnosis not present

## 2020-01-26 DIAGNOSIS — I639 Cerebral infarction, unspecified: Secondary | ICD-10-CM | POA: Diagnosis not present

## 2020-01-26 LAB — CBC WITH DIFFERENTIAL/PLATELET
Abs Immature Granulocytes: 0.09 10*3/uL — ABNORMAL HIGH (ref 0.00–0.07)
Basophils Absolute: 0.1 10*3/uL (ref 0.0–0.1)
Basophils Relative: 1 %
Eosinophils Absolute: 0.1 10*3/uL (ref 0.0–0.5)
Eosinophils Relative: 1 %
HCT: 46 % (ref 36.0–46.0)
Hemoglobin: 14.8 g/dL (ref 12.0–15.0)
Immature Granulocytes: 1 %
Lymphocytes Relative: 8 %
Lymphs Abs: 1.2 10*3/uL (ref 0.7–4.0)
MCH: 31.8 pg (ref 26.0–34.0)
MCHC: 32.2 g/dL (ref 30.0–36.0)
MCV: 98.9 fL (ref 80.0–100.0)
Monocytes Absolute: 1.1 10*3/uL — ABNORMAL HIGH (ref 0.1–1.0)
Monocytes Relative: 8 %
Neutro Abs: 12.1 10*3/uL — ABNORMAL HIGH (ref 1.7–7.7)
Neutrophils Relative %: 81 %
Platelets: 243 10*3/uL (ref 150–400)
RBC: 4.65 MIL/uL (ref 3.87–5.11)
RDW: 12.2 % (ref 11.5–15.5)
WBC: 14.7 10*3/uL — ABNORMAL HIGH (ref 4.0–10.5)
nRBC: 0 % (ref 0.0–0.2)

## 2020-01-26 LAB — BASIC METABOLIC PANEL
Anion gap: 9 (ref 5–15)
BUN: 14 mg/dL (ref 8–23)
CO2: 22 mmol/L (ref 22–32)
Calcium: 8.4 mg/dL — ABNORMAL LOW (ref 8.9–10.3)
Chloride: 106 mmol/L (ref 98–111)
Creatinine, Ser: 0.51 mg/dL (ref 0.44–1.00)
GFR calc Af Amer: >60 mL/min (ref >60)
GFR calc non Af Amer: >60 mL/min (ref >60)
Glucose, Bld: 139 mg/dL — ABNORMAL HIGH (ref 70–99)
Potassium: 3.9 mmol/L (ref 3.5–5.1)
Sodium: 137 mmol/L (ref 135–145)

## 2020-01-26 LAB — ABO/RH: ABO/RH(D): A POS

## 2020-01-26 LAB — TYPE AND SCREEN
ABO/RH(D): A POS
Antibody Screen: NEGATIVE

## 2020-01-26 LAB — PROTIME-INR
INR: 0.9 (ref 0.8–1.2)
Prothrombin Time: 12.2 seconds (ref 11.4–15.2)

## 2020-01-26 MED ORDER — HEPARIN SODIUM (PORCINE) 5000 UNIT/ML IJ SOLN
5000.0000 [IU] | Freq: Three times a day (TID) | INTRAMUSCULAR | Status: DC
  Administered 2020-01-27 (×3): 5000 [IU] via SUBCUTANEOUS
  Filled 2020-01-26 (×3): qty 1

## 2020-01-26 MED ORDER — AMLODIPINE BESYLATE 5 MG PO TABS
5.0000 mg | ORAL_TABLET | Freq: Every day | ORAL | Status: DC
  Administered 2020-01-27 – 2020-01-30 (×2): 5 mg via ORAL
  Filled 2020-01-26 (×4): qty 1

## 2020-01-26 MED ORDER — MORPHINE SULFATE (PF) 2 MG/ML IV SOLN
2.0000 mg | INTRAVENOUS | Status: DC | PRN
  Administered 2020-01-26 – 2020-01-30 (×5): 2 mg via INTRAVENOUS
  Filled 2020-01-26 (×5): qty 1

## 2020-01-26 MED ORDER — ONDANSETRON HCL 4 MG/2ML IJ SOLN
4.0000 mg | Freq: Once | INTRAMUSCULAR | Status: AC
  Administered 2020-01-26: 4 mg via INTRAVENOUS
  Filled 2020-01-26: qty 2

## 2020-01-26 MED ORDER — ACETAMINOPHEN 325 MG PO TABS
650.0000 mg | ORAL_TABLET | Freq: Four times a day (QID) | ORAL | Status: DC | PRN
  Administered 2020-01-28: 650 mg via ORAL
  Filled 2020-01-26: qty 2

## 2020-01-26 MED ORDER — POLYETHYLENE GLYCOL 3350 17 G PO PACK: 17.0000 g | PACK | Freq: Every day | ORAL | Status: DC | PRN

## 2020-01-26 MED ORDER — ACETAMINOPHEN 650 MG RE SUPP: 650.0000 mg | Freq: Four times a day (QID) | RECTAL | Status: DC | PRN

## 2020-01-26 MED ORDER — PRAVASTATIN SODIUM 40 MG PO TABS
40.0000 mg | ORAL_TABLET | Freq: Every day | ORAL | Status: DC
  Administered 2020-01-27 – 2020-02-02 (×7): 40 mg via ORAL
  Filled 2020-01-26 (×11): qty 1

## 2020-01-26 MED ORDER — ONDANSETRON HCL 4 MG PO TABS: 4.0000 mg | ORAL_TABLET | Freq: Four times a day (QID) | ORAL | Status: DC | PRN

## 2020-01-26 MED ORDER — ONDANSETRON HCL 4 MG/2ML IJ SOLN
4.0000 mg | Freq: Four times a day (QID) | INTRAMUSCULAR | Status: DC | PRN
  Administered 2020-01-27 – 2020-01-28 (×2): 4 mg via INTRAVENOUS
  Filled 2020-01-26: qty 2

## 2020-01-26 MED ORDER — OXYCODONE HCL 5 MG PO TABS
5.0000 mg | ORAL_TABLET | ORAL | Status: DC | PRN
  Administered 2020-01-27: 5 mg via ORAL
  Filled 2020-01-26 (×2): qty 1

## 2020-01-26 MED ORDER — MORPHINE SULFATE (PF) 4 MG/ML IV SOLN
4.0000 mg | Freq: Once | INTRAVENOUS | Status: AC
  Administered 2020-01-26: 4 mg via INTRAVENOUS
  Filled 2020-01-26: qty 1

## 2020-01-26 MED ORDER — ASPIRIN EC 81 MG PO TBEC
81.0000 mg | DELAYED_RELEASE_TABLET | Freq: Every day | ORAL | Status: DC
  Administered 2020-01-27 – 2020-02-02 (×7): 81 mg via ORAL
  Filled 2020-01-26 (×8): qty 1

## 2020-01-26 NOTE — ED Notes (Signed)
Pt transported to X-Ray by X-Ray staff at this time.

## 2020-01-26 NOTE — ED Triage Notes (Signed)
Pt. Fell getting mail at 3:00 this afternoon. Pt. States they fell on their bottom. Pt. States their right hip hurts.

## 2020-01-26 NOTE — ED Provider Notes (Signed)
Care assumed from Tracey Amor, PA-C at shift change with XRs pending.   In brief, this patient is a 86 y.o. female with past history of hypertension, dyslipidemia, thoracic aneurysm presenting for evaluation of right hip and femur pain after mechanical fall that occurred this afternoon.  She reports that she was walking to her mailbox and states that she tripped, causing her to land on her buttock.  She states that her right foot was tucked underneath her and caused her leg kind of twisted an abnormal way.  She denies any head injury or LOC.  She is not on blood thinners.  Since then, she has been unable to ambulate or bear weight on her right lower extremity secondary to pain.  Please see note from previous provider for history/physical exam.   Physical Exam  BP 140/87 (BP Location: Left Arm)   Pulse 97   Temp 97.9 F (36.6 C) (Oral)   Resp 14   Ht 5\' 3"  (1.6 m)   Wt 51.3 kg   SpO2 97%   BMI 20.02 kg/m   Physical Exam   Right lower extremity slightly shortened.  ED Course/Procedures     Procedures  Results for orders placed or performed during the hospital encounter of 01/26/20 (from the past 24 hour(s))  Basic metabolic panel     Status: Abnormal   Collection Time: 01/26/20  7:08 PM  Result Value Ref Range   Sodium 137 135 - 145 mmol/L   Potassium 3.9 3.5 - 5.1 mmol/L   Chloride 106 98 - 111 mmol/L   CO2 22 22 - 32 mmol/L   Glucose, Bld 139 (H) 70 - 99 mg/dL   BUN 14 8 - 23 mg/dL   Creatinine, Ser 01/28/20 0.44 - 1.00 mg/dL   Calcium 8.4 (L) 8.9 - 10.3 mg/dL   GFR calc non Af Amer >60 >60 mL/min   GFR calc Af Amer >60 >60 mL/min   Anion gap 9 5 - 15  CBC WITH DIFFERENTIAL     Status: Abnormal   Collection Time: 01/26/20  7:08 PM  Result Value Ref Range   WBC 14.7 (H) 4.0 - 10.5 K/uL   RBC 4.65 3.87 - 5.11 MIL/uL   Hemoglobin 14.8 12.0 - 15.0 g/dL   HCT 01/28/20 36 - 46 %   MCV 98.9 80.0 - 100.0 fL   MCH 31.8 26.0 - 34.0 pg   MCHC 32.2 30.0 - 36.0 g/dL   RDW 02.5 42.7 -  06.2 %   Platelets 243 150 - 400 K/uL   nRBC 0.0 0.0 - 0.2 %   Neutrophils Relative % 81 %   Neutro Abs 12.1 (H) 1.7 - 7.7 K/uL   Lymphocytes Relative 8 %   Lymphs Abs 1.2 0.7 - 4.0 K/uL   Monocytes Relative 8 %   Monocytes Absolute 1.1 (H) 0 - 1 K/uL   Eosinophils Relative 1 %   Eosinophils Absolute 0.1 0 - 0 K/uL   Basophils Relative 1 %   Basophils Absolute 0.1 0 - 0 K/uL   Immature Granulocytes 1 %   Abs Immature Granulocytes 0.09 (H) 0.00 - 0.07 K/uL  Protime-INR     Status: None   Collection Time: 01/26/20  7:08 PM  Result Value Ref Range   Prothrombin Time 12.2 11.4 - 15.2 seconds   INR 0.9 0.8 - 1.2  Type and screen [facility]     Status: None   Collection Time: 01/26/20  7:08 PM  Result Value Ref  Range   ABO/RH(D) A POS    Antibody Screen NEG    Sample Expiration      01/29/2020,2359 Performed at [facility], [address] [place], [place] [address]   ABO/Rh     Status: None   Collection Time: 01/26/20  8:26 PM  Result Value Ref Range   ABO/RH(D)      A POS Performed at [facility], [address] [place], [place] [address]      MDM    PLAN: Concern for fracture versus dislocation.  X-rays ordered. MDM:  XR shows right femoral neck fracture. XR elbow negative.   CBC shows slight leukocytosis of 14.7.  BMP shows normal BUN and creatinine.  Discussed patient with Dr. Aundria Rud (Ortho). He would like me to obtain a CT scan.   Discussed patient with Dr. Aundria Rud (Ortho) after CT scan showing obvious right femoral neck fracture.  He requested medicine admit patient.  She will need to be transferred to [facility].  He will most likely take her to the OR on Sunday.  Discussed patient with Dr. Carren Rang (hospitalist) who accepts patient for admission.     1. Closed displaced fracture of right femoral neck (HCC)   2. [address]      Tracey Holmes 01/26/20 2235    Tracey Mulders, MD 01/30/20 1705

## 2020-01-26 NOTE — Progress Notes (Signed)
Discuss case with EDP.  Will need transfer to [facility] for definitive fix of right hip fracture.  Will consult once patient arrives.  Thanks to [facility] for admission.

## 2020-01-26 NOTE — ED Provider Notes (Signed)
[facility] EMERGENCY DEPARTMENT Provider Note   CSN: [id number] Arrival date & time: 01/26/20  1618     History Chief Complaint  Patient presents with  . Fall    Tracey Holmes is a 86 y.o. female with a history including hypertension, dyslipidemia, thoracic aneurysm presenting for evaluation of right hip and femur pain which occurred suddenly when she fell walking to the mailbox.  She reports tripping and landing describing falling on her buttocks, but her right foot was tucked underneath her when she landed.  She also struck her right elbow, denies pain at this site.  She denies head neck or back injury nor any other complaints.  She was unable to weight-bear since this event and had to be helped off the ground.  She has had no treatment prior to arrival.  She describes pain in the right hip region that radiates into her mid upper thigh.  She denies numbness distal to the injury site, also denies lower extremity ankle or foot pain.  HPI     Past Medical History:  Diagnosis Date  . Aneurysm, descending thoracic aorta    Proximal (1.5 cm)  . Cystic kidney disease   . Dyslipidemia   . HTN (hypertension)   . Personal history of kidney stones     Patient Active Problem List   Diagnosis Date Noted  . Thoracic aortic aneurysm without rupture (HCC) 02/01/2019  . HTN (hypertension)   . Personal history of kidney stones   . Cystic kidney disease   . Dyslipidemia   . HYPERLIPIDEMIA 11/17/2007  . HYPERTENSION 11/17/2007  . LUNG NODULE 11/17/2007  . RASH AND OTHER NONSPECIFIC SKIN ERUPTION 11/10/2007    No past surgical history on file.   OB History   No obstetric history on file.     No family history on file.  Social History   Tobacco Use  . Smoking status: Never Smoker  . Smokeless tobacco: Never Used  Substance Use Topics  . Alcohol use: No  . Drug use: No    Home Medications Prior to Admission medications   Medication Sig Start Date End Date Taking?  Authorizing Provider  ALPHAGAN P 0.1 % SOLN INSTILL 1 DROP INTO LEFT EYE TWICE DAILY 07/11/15   [provider]  amLODipine (NORVASC) 5 MG tablet Take 5 mg by mouth daily.  12/29/10   [provider]  aspirin 81 MG tablet Take 81 mg by mouth daily.      [provider]  Cholecalciferol (VITAMIN D) 2000 UNITS tablet Take 4,000 Units by mouth daily.    [provider]  denosumab (PROLIA) 60 MG/ML SOLN injection Inject 60 mg into the skin every 6 (six) months. Administer in upper arm, thigh, or abdomen    [provider]  dorzolamide-timolol (COSOPT) 22.3-6.8 MG/ML ophthalmic solution INSTILL 1 DROP INTO LEFT EYE TWICE A DAY 04/22/15   [provider]  Multiple Vitamin (MULTIVITAMIN WITH MINERALS) TABS tablet Take 1 tablet by mouth daily.    [provider]  niacin 250 MG tablet Take 250 mg by mouth daily with breakfast.      [provider]  pravastatin (PRAVACHOL) 40 MG tablet Take 40 mg by mouth daily.  12/21/10   [provider]    Allergies    Penicillins  Review of Systems   Review of Systems  HENT: Negative.   Respiratory: Negative.   Cardiovascular: Negative.   Gastrointestinal: Negative.   Musculoskeletal: Positive for arthralgias. Negative for joint  swelling and myalgias.  Skin: Negative.   Neurological: Negative for weakness, numbness and headaches.    Physical Exam Updated Vital Signs BP (!) 171/95 (BP Location: Left Arm)   Pulse 90   Temp 97.9 F (36.6 C) (Oral)   Resp 18   Ht 5\' 3"  (1.6 m)   Wt 51.3 kg   SpO2 99%   BMI 20.02 kg/m   Physical Exam Constitutional:      Appearance: She is well-developed.  HENT:     Head: Normocephalic and atraumatic.     Comments: No scalp, face or neck tenderness of trauma.   Cardiovascular:     Comments: Pulses equal bilaterally Musculoskeletal:        General: Tenderness present. No swelling.     Right elbow: Swelling present. No deformity.  Tenderness present in olecranon process.     Cervical back: Normal range of motion.       Legs:     Comments: Tender to palpation of her right anterior groin through her mid anterior thigh.  No palpable deformity.  She is holding her leg in mild external rotation.  Distal sensation is intact, pedal pulses are intact.  She can flex and extend her ankle without pain.  Knee is also nontender to palpation.  ttp right olecranon process. No edema, moderate bruising. Skin intact.    Skin:    General: Skin is warm and dry.  Neurological:     Mental Status: She is alert.     Sensory: No sensory deficit.     Deep Tendon Reflexes: Reflexes normal.     ED Results / Procedures / Treatments   Labs (all labs ordered are listed, but only abnormal results are displayed) Labs Reviewed  BASIC METABOLIC PANEL  CBC WITH DIFFERENTIAL/PLATELET  PROTIME-INR  TYPE AND SCREEN    EKG None  Radiology No results found.  Procedures Procedures (including critical care time)  Medications Ordered in ED Medications  morphine 4 MG/ML injection 4 mg (has no administration in time range)  ondansetron (ZOFRAN) injection 4 mg (has no administration in time range)    ED Course  I have reviewed the triage vital signs and the nursing notes.  Pertinent labs & imaging results that were available during my care of the patient were reviewed by me and considered in my medical decision making (see chart for details).    MDM Rules/Calculators/A&P                          Pt with fall occurring this afternoon with pain in the right hip and thigh, unable to weight bear.  Pending labs and xrays, suspect acute hip fracture.  Pending imaging,labs, ekg also ordered.  Discussed with who will dispo pending imaging results.  Final Clinical Impression(s) / ED Diagnoses Final diagnoses:  Fall    Rx / DC Orders ED Discharge Orders    None       Gwendalyn Ege 01/26/20 01/28/20, MD 01/30/20 [phone]

## 2020-01-26 NOTE — ED Notes (Addendum)
I assumed care of this patient at this time. At the time of assuming care, patient resting comfortably in bed with no signs of distress. All questions and concerns voiced addressed by this RN at this time. Bed is locked in the lowest position, side rails x2, call bell within reach. Pt educated on hourly rounding and is in agreement at this time. Will continue to monitor at this time.

## 2020-01-26 NOTE — ED Notes (Signed)
Pt placed on a Purewick at this time for voiding. This RN attempted to place patient in a hospital gown but patient refusing at this time. Will continue to monitor.

## 2020-01-27 DIAGNOSIS — S72001A Fracture of unspecified part of neck of right femur, initial encounter for closed fracture: Secondary | ICD-10-CM

## 2020-01-27 LAB — RESPIRATORY PANEL BY RT PCR (FLU A&B, COVID)
Influenza A by PCR: NEGATIVE
Influenza B by PCR: NEGATIVE
SARS Coronavirus 2 by RT PCR: NEGATIVE

## 2020-01-27 LAB — COMPREHENSIVE METABOLIC PANEL
ALT: 23 U/L (ref 0–44)
AST: 25 U/L (ref 15–41)
Albumin: 4 g/dL (ref 3.5–5.0)
Alkaline Phosphatase: 44 U/L (ref 38–126)
Anion gap: 12 (ref 5–15)
BUN: 13 mg/dL (ref 8–23)
CO2: 23 mmol/L (ref 22–32)
Calcium: 8.6 mg/dL — ABNORMAL LOW (ref 8.9–10.3)
Chloride: 103 mmol/L (ref 98–111)
Creatinine, Ser: 0.61 mg/dL (ref 0.44–1.00)
GFR calc Af Amer: >60 mL/min (ref >60)
GFR calc non Af Amer: >60 mL/min (ref >60)
Glucose, Bld: 140 mg/dL — ABNORMAL HIGH (ref 70–99)
Potassium: 4.3 mmol/L (ref 3.5–5.1)
Sodium: 138 mmol/L (ref 135–145)
Total Bilirubin: 1.1 mg/dL (ref 0.3–1.2)
Total Protein: 6.9 g/dL (ref 6.5–8.1)

## 2020-01-27 LAB — MAGNESIUM: Magnesium: 2.1 mg/dL (ref 1.7–2.4)

## 2020-01-27 LAB — CBC WITH DIFFERENTIAL/PLATELET
Abs Immature Granulocytes: 0.05 10*3/uL (ref 0.00–0.07)
Basophils Absolute: 0 10*3/uL (ref 0.0–0.1)
Basophils Relative: 0 %
Eosinophils Absolute: 0 10*3/uL (ref 0.0–0.5)
Eosinophils Relative: 0 %
HCT: 46.4 % — ABNORMAL HIGH (ref 36.0–46.0)
Hemoglobin: 15.3 g/dL — ABNORMAL HIGH (ref 12.0–15.0)
Immature Granulocytes: 0 %
Lymphocytes Relative: 11 %
Lymphs Abs: 1.5 10*3/uL (ref 0.7–4.0)
MCH: 31.7 pg (ref 26.0–34.0)
MCHC: 33 g/dL (ref 30.0–36.0)
MCV: 96.3 fL (ref 80.0–100.0)
Monocytes Absolute: 1.6 10*3/uL — ABNORMAL HIGH (ref 0.1–1.0)
Monocytes Relative: 13 %
Neutro Abs: 9.5 10*3/uL — ABNORMAL HIGH (ref 1.7–7.7)
Neutrophils Relative %: 76 %
Platelets: 237 10*3/uL (ref 150–400)
RBC: 4.82 MIL/uL (ref 3.87–5.11)
RDW: 12.3 % (ref 11.5–15.5)
WBC: 12.7 10*3/uL — ABNORMAL HIGH (ref 4.0–10.5)
nRBC: 0 % (ref 0.0–0.2)

## 2020-01-27 LAB — SURGICAL PCR SCREEN
MRSA, PCR: NEGATIVE
Staphylococcus aureus: NEGATIVE

## 2020-01-27 LAB — TSH: TSH: 1.039 u[IU]/mL (ref 0.350–4.500)

## 2020-01-27 LAB — PROTIME-INR
INR: 0.9 (ref 0.8–1.2)
Prothrombin Time: 12.2 seconds (ref 11.4–15.2)

## 2020-01-27 MED ORDER — NETARSUDIL-LATANOPROST 0.02-0.005 % OP SOLN: 1.0000 [drp] | Freq: Every day | OPHTHALMIC | Status: DC

## 2020-01-27 MED ORDER — POVIDONE-IODINE 10 % EX SWAB: 2.0000 "application " | Freq: Once | CUTANEOUS | Status: DC

## 2020-01-27 MED ORDER — CEFAZOLIN SODIUM-DEXTROSE 2-4 GM/100ML-% IV SOLN
2.0000 g | INTRAVENOUS | Status: AC
  Administered 2020-01-28: 2 g via INTRAVENOUS
  Filled 2020-01-27: qty 100

## 2020-01-27 MED ORDER — BRIMONIDINE TARTRATE 0.2 % OP SOLN
1.0000 [drp] | Freq: Three times a day (TID) | OPHTHALMIC | Status: DC
  Administered 2020-01-27 – 2020-02-02 (×17): 1 [drp] via OPHTHALMIC
  Filled 2020-01-27 (×2): qty 5

## 2020-01-27 MED ORDER — CHLORHEXIDINE GLUCONATE 4 % EX LIQD
60.0000 mL | Freq: Once | CUTANEOUS | Status: DC
  Filled 2020-01-27: qty 60

## 2020-01-27 MED ORDER — TRANEXAMIC ACID-NACL 1000-0.7 MG/100ML-% IV SOLN
1000.0000 mg | INTRAVENOUS | Status: AC
  Administered 2020-01-28: 1000 mg via INTRAVENOUS
  Filled 2020-01-27: qty 100

## 2020-01-27 NOTE — ED Notes (Signed)
Patient given ordered PRN medication for nausea and pain prior to discharge.

## 2020-01-27 NOTE — ED Notes (Signed)
[facility]. [facility] in [facility] to transport patient to [facility].

## 2020-01-27 NOTE — Progress Notes (Signed)
Will plan for operateive treatment of right hip tomorrow am.  Ok to have diet today.  NPO tonight at [place].

## 2020-01-27 NOTE — Progress Notes (Signed)
Patient is a 86 year old female with history of nephrolithiasis, hypertension, hyperlipidemia who presents to the emergency department with complaint of fall.  She was walking towards her mailbox and she fell on the ground on her right side.  Felt immediate pain and was unable to move her right lower extremity.  Patient was brought to the emergency department.  She was hemodynamically stable on presentation.  Imaging showed comminuted impacted subcapital right femoral neck fracture.  Orthopedics consulted. Plan is to transfer her to [facility].  Orthopedics already aware about her. Patient seen and examined the bedside this morning.  She is hemodynamically stable.  Currently her pain is well controlled.  She said she does not have any family here in the living [place].  She lives alone.    She does not use any walker or cane at baseline. We will order physical therapy therapy after the surgery.  She is on Heparin Norco for dvt prophylaxis .We will continue  pain management . She denied any complaint to me.  Assessment and plan:  Right hip fracture: Management as above.PT/OT evaluation after surgery.   Hypertension: Currently blood pressure stable.  Continue home medications  Hyperlipidemia: Continue statin  Leucocytosis: Mild.Most likely reactive  Exam:  General exam: Appears calm and comfortable ,Not in distress,average built HEENT:PERRL,Oral mucosa moist, Ear/Nose normal on gross exam Respiratory system: Bilateral equal air entry, normal vesicular breath sounds, no wheezes or crackles  Cardiovascular system: S1 & S2 heard, RRR. No JVD, murmurs, rubs, gallops or clicks. Gastrointestinal system: Abdomen is nondistended, soft and nontender. No organomegaly or masses felt. Normal bowel sounds heard. Central nervous system: Alert and oriented. No focal neurological deficits. Extremities: No edema, no clubbing ,no cyanosis, right hip tenderness, decreased mobility of the right hip, right lower extremity  externally rotated. Skin: No rashes, lesions or ulcers,no icterus ,no pallor

## 2020-01-27 NOTE — H&P (Signed)
TRH H&P    Patient Demographics:    Tracey Holmes, is a 86 y.o. female  MRN: [id number]  DOB - 04-Mar-1934  Admit Date - 01/26/2020  Referring MD/NP/PA: Deretha Emory  Outpatient Primary MD for the patient is Laurann Montana, MD  Patient coming from: Home  Chief complaint- Fall   HPI:    Tracey Holmes  is a 86 y.o. female, with history of nephrolithiasis, hypertension, hyperlipidemia, cystic kidney disease, aorta aneurysm, presents to the ED with a chief complaint of fall.  Patient reports that she was walking to her mailbox when all of a sudden she was on the ground.  She reports it happened so fast that she is not sure what happened.  She reports that she did not feel any preceding symptoms like lightheadedness, chest pain, palpitations, or shortness of breath.  Patient reports that she did not hit her head.  She did not lose consciousness.  Patient reports that she fell onto her right side with her right leg pinned underneath her.  She was not able to get herself up, and neighbors came to help.  They were trying to get her to the house but she was unable to weight-bear at all on the right leg so they called 911.  Patient reports that she has no numbness, or tingling in her right leg.  Patient has no other complaints at this time.  In the ED Temperature 97.9, heart rate 97, respiratory rate 14, blood pressure 140/87, 97% on room air White blood cell count 14.7 Glucose 139 Blood type a positive CT scan shows comminuted impacted subcapital right femoral neck fracture EKG equals heart rate 99, sinus rhythm, QTC 450 Patient was given morphine, Zofran Covid is pending    Review of systems:    In addition to the HPI above,  Review of Systems  Constitutional: Negative for chills, fever and malaise/fatigue.  HENT: Negative for congestion, sinus pain and sore throat.   Eyes: Negative for pain, discharge and  redness.  Respiratory: Negative for cough, shortness of breath and wheezing.   Cardiovascular: Negative for chest pain, palpitations and orthopnea.  Gastrointestinal: Positive for diarrhea (2 loose stools today). Negative for abdominal pain, nausea and vomiting.  Genitourinary: Negative for dysuria, frequency and urgency.  Musculoskeletal: Positive for falls and joint pain. Negative for myalgias.  Skin: Negative for itching and rash.  Neurological: Negative for focal weakness and weakness.  Endo/Heme/Allergies: Bruises/bleeds easily.    All other systems reviewed and are negative.    Past History of the following :    Past Medical History:  Diagnosis Date  . Aneurysm, descending thoracic aorta    Proximal (1.5 cm)  . Cystic kidney disease   . Dyslipidemia   . HTN (hypertension)   . Personal history of kidney stones       No past surgical history on file.    Social History:      Social History   Tobacco Use  . Smoking status: Never Smoker  . Smokeless tobacco: Never Used  Substance Use Topics  .  Alcohol use: No       Family History :    No family history on file. Denies any known family history   Home Medications:   Prior to Admission medications   Medication Sig Start Date End Date Taking? Authorizing Provider  amLODipine (NORVASC) 5 MG tablet Take 5 mg by mouth daily.  12/29/10  Yes [provider]  aspirin 81 MG tablet Take 81 mg by mouth daily.     Yes [provider]  brimonidine (ALPHAGAN) 0.2 % ophthalmic solution Place 1 drop into both eyes 3 (three) times daily. 10/26/19  Yes [provider]  Cholecalciferol (VITAMIN D) 2000 UNITS tablet Take 4,000 Units by mouth daily.   Yes [provider]  denosumab (PROLIA) 60 MG/ML SOLN injection Inject 60 mg into the skin every 6 (six) months. Administer in upper arm, thigh, or abdomen   Yes [provider]  dorzolamide-timolol (COSOPT) 22.3-6.8 MG/ML ophthalmic  solution Place 1 drop into both eyes 2 (two) times daily.  04/22/15  Yes [provider]  Multiple Vitamin (MULTIVITAMIN WITH MINERALS) TABS tablet Take 1 tablet by mouth daily.   Yes [provider]  pravastatin (PRAVACHOL) 80 MG tablet Take 1 tablet by mouth every evening. 12/28/19  Yes [provider]  ROCKLATAN 0.02-0.005 % SOLN Place 1 drop into both eyes at bedtime.  01/10/20  Yes [provider]     Allergies:     Allergies  Allergen Reactions  . Penicillins      Physical Exam:   Vitals  Blood pressure 140/87, pulse 97, temperature 97.9 F (36.6 C), temperature source Oral, resp. rate 14, height  (1.6 m), weight 51.3 kg, SpO2 97 %.  1.  General: Lying supine in bed in no acute distress  2. Psychiatric: Alert and oriented x3 Mood and behavior normal for situation  3. Neurologic: Cranial nerves II through XII are grossly intact Right and left lower extremities are equal and sensation   4. HEENMT:  Head is atraumatic, normocephalic, pupils reactive to light, neck is cachectic, mucous membranes are moist, trachea is midline  5. Respiratory : Lungs are clear to auscultation bilaterally  6. Cardiovascular : Heart rate is normal, rhythm is regular, no murmurs rubs or gallops  7. Gastrointestinal:  Abdomen is soft, nondistended, nontender to palpation  8. Skin:  Multiple bruises over both forearms No rashes or other acute lesions on limited skin exam  9.Musculoskeletal:  Right leg is externally rotated knee is slightly flexed, right leg shorter compared to left    Data Review:    CBC Recent Labs  Lab 01/26/20 1908  WBC 14.7*  HGB 14.8  HCT 46.0  PLT 243  MCV 98.9  MCH 31.8  MCHC 32.2  RDW 12.2  LYMPHSABS 1.2  MONOABS 1.1*  EOSABS 0.1  BASOSABS 0.1   ------------------------------------------------------------------------------------------------------------------  Results for orders placed or performed during  the hospital encounter of 01/26/20 (from the past 48 hour(s))  Basic metabolic panel     Status: Abnormal   Collection Time: 01/26/20  7:08 PM  Result Value Ref Range   Sodium 137 135 - 145 mmol/L   Potassium 3.9 3.5 - 5.1 mmol/L   Chloride 106 98 - 111 mmol/L   CO2 22 22 - 32 mmol/L   Glucose, Bld 139 (H) 70 - 99 mg/dL    Comment: Glucose reference range applies only to samples taken after fasting for at least 8 hours.   BUN 14 8 - 23  mg/dL   Creatinine, Ser 1.610.51 0.44 - 1.00 mg/dL   Calcium 8.4 (L) 8.9 - 10.3 mg/dL   GFR calc non Af Amer >60 >60 mL/min   GFR calc Af Amer >60 >60 mL/min   Anion gap 9 5 - 15    Comment: Performed at [facility], [address] [place], [place] [address]  CBC WITH DIFFERENTIAL     Status: Abnormal   Collection Time: 01/26/20  7:08 PM  Result Value Ref Range   WBC 14.7 (H) 4.0 - 10.5 K/uL   RBC 4.65 3.87 - 5.11 MIL/uL   Hemoglobin 14.8 12.0 - 15.0 g/dL   HCT 54.046.0 36 - 46 %   MCV 98.9 80.0 - 100.0 fL   MCH 31.8 26.0 - 34.0 pg   MCHC 32.2 30.0 - 36.0 g/dL   RDW 98.112.2 19.111.5 - 47.815.5 %   Platelets 243 150 - 400 K/uL   nRBC 0.0 0.0 - 0.2 %   Neutrophils Relative % 81 %   Neutro Abs 12.1 (H) 1.7 - 7.7 K/uL   Lymphocytes Relative 8 %   Lymphs Abs 1.2 0.7 - 4.0 K/uL   Monocytes Relative 8 %   Monocytes Absolute 1.1 (H) 0 - 1 K/uL   Eosinophils Relative 1 %   Eosinophils Absolute 0.1 0 - 0 K/uL   Basophils Relative 1 %   Basophils Absolute 0.1 0 - 0 K/uL   Immature Granulocytes 1 %   Abs Immature Granulocytes 0.09 (H) 0.00 - 0.07 K/uL    Comment: Performed at [facility], [address] [place], [place] [address]  Protime-INR     Status: None   Collection Time: 01/26/20  7:08 PM  Result Value Ref Range   Prothrombin Time 12.2 11.4 - 15.2 seconds   INR 0.9 0.8 - 1.2    Comment: (NOTE) INR goal varies based on device and disease states. Performed at [facility], [address] [place], [place] [address]   Type and screen [facility]      Status: None   Collection Time: 01/26/20  7:08 PM  Result Value Ref Range   ABO/RH(D) A POS    Antibody Screen NEG    Sample Expiration      01/29/2020,2359 Performed at [facility], [address] [place], [place] [address]   ABO/Rh     Status: None   Collection Time: 01/26/20  8:26 PM  Result Value Ref Range   ABO/RH(D)      A POS Performed at [facility], [address] [place], [place] [address]   Respiratory Panel by RT PCR (Flu A&B, Covid) - Nasopharyngeal Swab     Status: None   Collection Time: 01/26/20 10:50 PM   Specimen: Nasopharyngeal Swab  Result Value Ref Range   SARS Coronavirus 2 by RT PCR NEGATIVE NEGATIVE    Comment: (NOTE) SARS-CoV-2 target nucleic acids are NOT DETECTED.  The SARS-CoV-2 RNA is generally detectable in upper respiratoy specimens during the acute phase of infection. The lowest concentration of SARS-CoV-2 viral copies this assay can detect is 131 copies/mL. A negative result does not preclude SARS-Cov-2 infection and should not be used as the sole basis for treatment or other patient management decisions. A negative result may occur with  improper specimen collection/handling, submission of specimen other than nasopharyngeal swab, presence of viral mutation(s) within the areas targeted by this assay, and inadequate number of viral copies (<131 copies/mL). A negative result must be combined with clinical observations, patient  history, and epidemiological information. The expected result is Negative.  Fact Sheet for Patients:  https://www.moore.com/  Fact Sheet for Healthcare Providers:  https://www.young.biz/  This test is no t yet approved or cleared by the [place] FDA and  has been authorized for detection and/or diagnosis of SARS-CoV-2 by FDA under an Emergency Use Authorization (EUA). This EUA will remain  in effect (meaning this test can be used) for the duration of the COVID-19  declaration under Section 564(b)(1) of the Act, 21 U.S.C. section 360bbb-3(b)(1), unless the authorization is terminated or revoked sooner.     Influenza A by PCR NEGATIVE NEGATIVE   Influenza B by PCR NEGATIVE NEGATIVE    Comment: (NOTE) The Xpert Xpress SARS-CoV-2/FLU/RSV assay is intended as an aid in  the diagnosis of influenza from Nasopharyngeal swab specimens and  should not be used as a sole basis for treatment. Nasal washings and  aspirates are unacceptable for Xpert Xpress SARS-CoV-2/FLU/RSV  testing.  Fact Sheet for Patients: https://www.moore.com/  Fact Sheet for Healthcare Providers: https://www.young.biz/  This test is not yet approved or cleared by the [place] FDA and  has been authorized for detection and/or diagnosis of SARS-CoV-2 by  FDA under an Emergency Use Authorization (EUA). This EUA will remain  in effect (meaning this test can be used) for the duration of the  Covid-19 declaration under Section 564(b)(1) of the Act, 21  U.S.C. section 360bbb-3(b)(1), unless the authorization is  terminated or revoked. Performed at [facility], [address] [place], [place] [address]     Chemistries  Recent Labs  Lab 01/26/20 1908  NA 137  K 3.9  CL 106  CO2 22  GLUCOSE 139*  BUN 14  CREATININE 0.51  CALCIUM 8.4*   ------------------------------------------------------------------------------------------------------------------  ------------------------------------------------------------------------------------------------------------------ GFR: Estimated Creatinine Clearance: 42.4 mL/min (by C-G formula based on SCr of 0.51 mg/dL). Liver Function Tests: No results for input(s): AST, ALT, ALKPHOS, BILITOT, PROT, ALBUMIN in the last 168 hours. No results for input(s): LIPASE, AMYLASE in the last 168 hours. No results for input(s): AMMONIA in the last 168 hours. Coagulation Profile: Recent Labs  Lab  01/26/20 1908  INR 0.9   Cardiac Enzymes: No results for input(s): CKTOTAL, CKMB, CKMBINDEX, TROPONINI in the last 168 hours. BNP (last 3 results) No results for input(s): PROBNP in the last 8760 hours. HbA1C: No results for input(s): HGBA1C in the last 72 hours. CBG: No results for input(s): GLUCAP in the last 168 hours. Lipid Profile: No results for input(s): CHOL, HDL, LDLCALC, TRIG, CHOLHDL, LDLDIRECT in the last 72 hours. Thyroid Function Tests: No results for input(s): TSH, T4TOTAL, FREET4, T3FREE, THYROIDAB in the last 72 hours. Anemia Panel: No results for input(s): VITAMINB12, FOLATE, FERRITIN, TIBC, IRON, RETICCTPCT in the last 72 hours.  --------------------------------------------------------------------------------------------------------------- Urine analysis:    Component Value Date/Time   COLORURINE YELLOW 01/07/2008 1844   APPEARANCEUR CLEAR 01/07/2008 1844   LABSPEC 1.014 01/07/2008 1844   PHURINE 6.0 01/07/2008 1844   GLUCOSEU NEGATIVE 01/07/2008 1844   HGBUR NEGATIVE 01/07/2008 1844   BILIRUBINUR NEGATIVE 01/07/2008 1844   KETONESUR NEGATIVE 01/07/2008 1844   PROTEINUR NEGATIVE 01/07/2008 1844   UROBILINOGEN 1.0 01/07/2008 1844   NITRITE NEGATIVE 01/07/2008 1844   LEUKOCYTESUR TRACE (A) 01/07/2008 1844      Imaging Results:    DG Elbow Complete Right  Result Date: 01/26/2020 CLINICAL DATA:  Pain, bruising, and swelling after a fall today EXAM: RIGHT ELBOW - COMPLETE 3+ VIEW COMPARISON:  None. FINDINGS: There is an  old appearing ununited ossicle over the lateral humeral epicondyle. Mild degenerative changes in the elbow. No evidence of acute fracture or dislocation. No focal bone lesion or bone destruction. No significant effusion. Soft tissues are unremarkable. IMPRESSION: No acute bony abnormalities. Electronically Signed   By: Burman Nieves M.D.   On: 01/26/2020 20:01   CT HIP RIGHT WO CONTRAST  Result Date: 01/26/2020 CLINICAL DATA:  Right hip  fracture after fall EXAM: CT OF THE RIGHT HIP WITHOUT CONTRAST TECHNIQUE: Multidetector CT imaging of the right hip was performed according to the standard protocol. Multiplanar CT image reconstructions were also generated. COMPARISON:  01/26/2011 FINDINGS: Bones/Joint/Cartilage There is an impacted comminuted subcapital right femoral neck fracture, with varus and ventral angulation at the fracture site. No dislocation. No other acute displaced fractures. Visualized portions of the bony pelvis are unremarkable. Ligaments Suboptimally assessed by CT. Muscles and Tendons No evidence of muscular injury. Soft tissues There is subcutaneous edema overlying the lateral aspect right hip, consistent with contusion. No fluid collection or hematoma. There is a right inguinal hernia which contains a gas-filled appendix. Remaining visualized soft tissues are unremarkable. Reconstructed images demonstrate no additional findings. IMPRESSION: 1. Comminuted impacted subcapital right femoral neck fracture, with varus and ventral angulation at the fracture site. 2. Subcutaneous edema overlying the right hip consistent with contusion. Electronically Signed   By: Sharlet Salina M.D.   On: 01/26/2020 22:11   DG HIP UNILAT WITH PELVIS 2-3 VIEWS RIGHT  Result Date: 01/26/2020 CLINICAL DATA:  Fall today, right hip injury, right foot turned out with no range of motion. EXAM: DG HIP (WITH OR WITHOUT PELVIS) 2-3V RIGHT COMPARISON:  None. FINDINGS: Poorly evaluated right femoral neck fracture. No dislocation of the bilateral hips. Frontal views of the left hip demonstrates no acute displaced fracture. Frontal views of the pelvis demonstrate no acute displaced fracture or diastasis. There is no evidence of severe arthropathy or other focal bone abnormality. Overlying subcutaneus soft tissue edema. Metallic density overlying the lumbar spine likely external to the patient. IMPRESSION: Right femoral neck fracture.  Consider CT for further  evaluation. Electronically Signed   By: Tish Frederickson M.D.   On: 01/26/2020 20:02    My personal review of EKG: Rhythm ST, Rate 99 /min, QTc 450 ,no Acute ST changes   Assessment & Plan:    Active Problems:   Closed right hip fracture (HCC)   1. Comminuted impacted subcapital right femoral fracture 1. Ortho consulted by ED provider -advises transfer to [facility] 2. N.p.o. after midnight 3. UA, Covid, chest x-ray pending for preop evaluation 4. EKG shows sinus tachycardia with a heart rate of 99, no ischemic changes, blood type is a positive for preop evaluation 5. Pain control with pain scale 6. N.p.o. after midnight 7. Continue to monitor 2. Leukocytosis 1. White blood cell count of 14.7 2. Likely stress reaction 3. Chest x-ray and UA pending as above 4. Continue to monitor 3. Hypertension 1. Blood pressure at arrival was 171/95 2. Blood pressure corrected without intervention to 140/80 3. Likely the initial high reading was due to pain 4. Continue home medications 5. Continue to monitor 4. Hyperlipidemia 1. Continue home medication    DVT Prophylaxis-   Heparin- SCDs   AM Labs Ordered, also please review Full Orders  Family Communication: No family at bedside  Code Status:  Full  Admission status: Inpatient :The appropriate admission status for this patient is INPATIENT. Inpatient status is judged to be reasonable and necessary in  order to provide the required intensity of service to ensure the patient's safety. The patient's presenting symptoms, physical exam findings, and initial radiographic and laboratory data in the context of their chronic comorbidities is felt to place them at high risk for further clinical deterioration. Furthermore, it is not anticipated that the patient will be medically stable for discharge from the hospital within 2 midnights of admission. The following factors support the admission status of inpatient.     The patient's presenting  symptoms include trip, fall, and unable to bear weight The worrisome physical exam findings include externally rotated right leg, shortened compared to the left The initial radiographic and laboratory data are worrisome because of Right closed hip fracture The chronic co-morbidities include HTN, HLD       * I certify that at the point of admission it is my clinical judgment that the patient will require inpatient hospital care spanning beyond 2 midnights from the point of admission due to high intensity of service, high risk for further deterioration and high frequency of surveillance required.*  Time spent in minutes : 65   Lilienne Weins B Zierle-Ghosh DO

## 2020-01-27 NOTE — Plan of Care (Signed)

## 2020-01-27 NOTE — Plan of Care (Signed)
  Problem: Pain Managment: Goal: General experience of comfort will improve Outcome: Progressing   Problem: Safety: Goal: Ability to remain free from injury will improve Outcome: Progressing   Problem: Skin Integrity: Goal: Risk for impaired skin integrity will decrease Outcome: Progressing   Problem: Education: Goal: Knowledge of General Education information will improve Description: Including pain rating scale, medication(s)/side effects and non-pharmacologic comfort measures Outcome: Progressing   

## 2020-01-27 NOTE — Progress Notes (Signed)
Patient arrived to unit a/o/vx4. Patient oriented to room. Skin check completed bruises noted. Will continue to monitor patient.

## 2020-01-28 ENCOUNTER — Inpatient Hospital Stay (HOSPITAL_COMMUNITY): Payer: Medicare Other | Admitting: Certified Registered"

## 2020-01-28 ENCOUNTER — Inpatient Hospital Stay (HOSPITAL_COMMUNITY): Payer: Medicare Other

## 2020-01-28 ENCOUNTER — Encounter (HOSPITAL_COMMUNITY)
Admission: EM | Disposition: A | Discharge status: Home Health | Payer: Self-pay | Source: Home / Self Care | Attending: Internal Medicine

## 2020-01-28 HISTORY — PX: TOTAL HIP ARTHROPLASTY: SHX124

## 2020-01-28 LAB — CBC WITH DIFFERENTIAL/PLATELET
Abs Immature Granulocytes: 0.04 10*3/uL (ref 0.00–0.07)
Basophils Absolute: 0 10*3/uL (ref 0.0–0.1)
Basophils Relative: 0 %
Eosinophils Absolute: 0.1 10*3/uL (ref 0.0–0.5)
Eosinophils Relative: 0 %
HCT: 43.7 % (ref 36.0–46.0)
Hemoglobin: 14.1 g/dL (ref 12.0–15.0)
Immature Granulocytes: 0 %
Lymphocytes Relative: 14 %
Lymphs Abs: 1.6 10*3/uL (ref 0.7–4.0)
MCH: 31.3 pg (ref 26.0–34.0)
MCHC: 32.3 g/dL (ref 30.0–36.0)
MCV: 96.9 fL (ref 80.0–100.0)
Monocytes Absolute: 1.6 10*3/uL — ABNORMAL HIGH (ref 0.1–1.0)
Monocytes Relative: 14 %
Neutro Abs: 8 10*3/uL — ABNORMAL HIGH (ref 1.7–7.7)
Neutrophils Relative %: 72 %
Platelets: 220 10*3/uL (ref 150–400)
RBC: 4.51 MIL/uL (ref 3.87–5.11)
RDW: 12.5 % (ref 11.5–15.5)
WBC: 11.4 10*3/uL — ABNORMAL HIGH (ref 4.0–10.5)
nRBC: 0 % (ref 0.0–0.2)

## 2020-01-28 LAB — TYPE AND SCREEN
ABO/RH(D): A POS
Antibody Screen: NEGATIVE

## 2020-01-28 LAB — BASIC METABOLIC PANEL
Anion gap: 9 (ref 5–15)
BUN: 15 mg/dL (ref 8–23)
CO2: 21 mmol/L — ABNORMAL LOW (ref 22–32)
Calcium: 8.3 mg/dL — ABNORMAL LOW (ref 8.9–10.3)
Chloride: 106 mmol/L (ref 98–111)
Creatinine, Ser: 0.81 mg/dL (ref 0.44–1.00)
GFR calc Af Amer: >60 mL/min (ref >60)
GFR calc non Af Amer: >60 mL/min (ref >60)
Glucose, Bld: 114 mg/dL — ABNORMAL HIGH (ref 70–99)
Potassium: 4.2 mmol/L (ref 3.5–5.1)
Sodium: 136 mmol/L (ref 135–145)

## 2020-01-28 SURGERY — ARTHROPLASTY, HIP, TOTAL, ANTERIOR APPROACH
Anesthesia: General | Site: Hip | Laterality: Right

## 2020-01-28 MED ORDER — BUPIVACAINE-EPINEPHRINE 0.5% -1:200000 IJ SOLN
INTRAMUSCULAR | Status: AC
  Filled 2020-01-28: qty 1

## 2020-01-28 MED ORDER — SODIUM CHLORIDE (PF) 0.9 % IJ SOLN
INTRAMUSCULAR | Status: AC
  Filled 2020-01-28: qty 50

## 2020-01-28 MED ORDER — ROCURONIUM BROMIDE 10 MG/ML (PF) SYRINGE
PREFILLED_SYRINGE | INTRAVENOUS | Status: AC
  Filled 2020-01-28: qty 10

## 2020-01-28 MED ORDER — PROPOFOL 10 MG/ML IV BOLUS
INTRAVENOUS | Status: AC
  Filled 2020-01-28: qty 20

## 2020-01-28 MED ORDER — BUPIVACAINE-EPINEPHRINE (PF) 0.5% -1:200000 IJ SOLN
INTRAMUSCULAR | Status: DC | PRN
  Administered 2020-01-28: 30 mL

## 2020-01-28 MED ORDER — SUCCINYLCHOLINE CHLORIDE 200 MG/10ML IV SOSY
PREFILLED_SYRINGE | INTRAVENOUS | Status: AC
  Filled 2020-01-28: qty 10

## 2020-01-28 MED ORDER — ENOXAPARIN SODIUM 40 MG/0.4ML ~~LOC~~ SOLN
40.0000 mg | SUBCUTANEOUS | Status: DC
  Administered 2020-01-29 – 2020-02-02 (×5): 40 mg via SUBCUTANEOUS
  Filled 2020-01-28 (×6): qty 0.4

## 2020-01-28 MED ORDER — METOCLOPRAMIDE HCL 5 MG/ML IJ SOLN: 5.0000 mg | Freq: Three times a day (TID) | INTRAMUSCULAR | Status: DC | PRN

## 2020-01-28 MED ORDER — CEFAZOLIN SODIUM-DEXTROSE 2-4 GM/100ML-% IV SOLN
2.0000 g | Freq: Four times a day (QID) | INTRAVENOUS | Status: AC
  Administered 2020-01-28 (×2): 2 g via INTRAVENOUS
  Filled 2020-01-28 (×2): qty 100

## 2020-01-28 MED ORDER — HYDROCODONE-ACETAMINOPHEN 7.5-325 MG PO TABS: 1.0000 | ORAL_TABLET | ORAL | Status: DC | PRN

## 2020-01-28 MED ORDER — DEXAMETHASONE SODIUM PHOSPHATE 10 MG/ML IJ SOLN
INTRAMUSCULAR | Status: AC
  Filled 2020-01-28: qty 1

## 2020-01-28 MED ORDER — SODIUM CHLORIDE (PF) 0.9 % IJ SOLN
INTRAMUSCULAR | Status: DC | PRN
  Administered 2020-01-28: 30 mL

## 2020-01-28 MED ORDER — ACETAMINOPHEN 325 MG PO TABS
325.0000 mg | ORAL_TABLET | Freq: Four times a day (QID) | ORAL | Status: DC | PRN
  Administered 2020-01-31: 650 mg via ORAL
  Filled 2020-01-28: qty 2

## 2020-01-28 MED ORDER — METOCLOPRAMIDE HCL 5 MG PO TABS
5.0000 mg | ORAL_TABLET | Freq: Three times a day (TID) | ORAL | Status: DC | PRN
  Filled 2020-01-28: qty 2

## 2020-01-28 MED ORDER — MORPHINE SULFATE (PF) 2 MG/ML IV SOLN: 0.5000 mg | INTRAVENOUS | Status: DC | PRN

## 2020-01-28 MED ORDER — HYDROCODONE-ACETAMINOPHEN 5-325 MG PO TABS
1.0000 | ORAL_TABLET | ORAL | Status: DC | PRN
  Administered 2020-02-01: 2 via ORAL
  Filled 2020-01-28: qty 2

## 2020-01-28 MED ORDER — DOCUSATE SODIUM 100 MG PO CAPS
100.0000 mg | ORAL_CAPSULE | Freq: Two times a day (BID) | ORAL | Status: DC
  Administered 2020-01-28 – 2020-01-31 (×6): 100 mg via ORAL
  Filled 2020-01-28 (×10): qty 1

## 2020-01-28 MED ORDER — NETARSUDIL-LATANOPROST 0.02-0.005 % OP SOLN: 1.0000 [drp] | Freq: Every day | OPHTHALMIC | Status: DC

## 2020-01-28 MED ORDER — CHLORHEXIDINE GLUCONATE 0.12 % MT SOLN
OROMUCOSAL | Status: AC
  Administered 2020-01-28: 15 mL via OROMUCOSAL
  Filled 2020-01-28: qty 15

## 2020-01-28 MED ORDER — LACTATED RINGERS IV SOLN: INTRAVENOUS | Status: DC | PRN

## 2020-01-28 MED ORDER — ALBUMIN HUMAN 5 % IV SOLN: INTRAVENOUS | Status: DC | PRN

## 2020-01-28 MED ORDER — KETOROLAC TROMETHAMINE 30 MG/ML IJ SOLN
INTRAMUSCULAR | Status: DC | PRN
  Administered 2020-01-28: 30 mg via INTRAMUSCULAR

## 2020-01-28 MED ORDER — PHENOL 1.4 % MT LIQD: 1.0000 | OROMUCOSAL | Status: DC | PRN

## 2020-01-28 MED ORDER — MENTHOL 3 MG MT LOZG: 1.0000 | LOZENGE | OROMUCOSAL | Status: DC | PRN

## 2020-01-28 MED ORDER — ROCURONIUM BROMIDE 100 MG/10ML IV SOLN
INTRAVENOUS | Status: DC | PRN
  Administered 2020-01-28: 30 mg via INTRAVENOUS

## 2020-01-28 MED ORDER — ORAL CARE MOUTH RINSE: 15.0000 mL | Freq: Once | OROMUCOSAL | Status: AC

## 2020-01-28 MED ORDER — 0.9 % SODIUM CHLORIDE (POUR BTL) OPTIME
TOPICAL | Status: DC | PRN
  Administered 2020-01-28: 1000 mL

## 2020-01-28 MED ORDER — PROPOFOL 10 MG/ML IV BOLUS
INTRAVENOUS | Status: DC | PRN
  Administered 2020-01-28: 100 mg via INTRAVENOUS

## 2020-01-28 MED ORDER — FENTANYL CITRATE (PF) 250 MCG/5ML IJ SOLN
INTRAMUSCULAR | Status: AC
  Filled 2020-01-28: qty 5

## 2020-01-28 MED ORDER — SODIUM CHLORIDE 0.9 % IV SOLN
INTRAVENOUS | Status: AC | PRN
  Administered 2020-01-28: 1000 mL

## 2020-01-28 MED ORDER — ONDANSETRON HCL 4 MG/2ML IJ SOLN: 4.0000 mg | Freq: Four times a day (QID) | INTRAMUSCULAR | Status: DC | PRN

## 2020-01-28 MED ORDER — PHENYLEPHRINE 40 MCG/ML (10ML) SYRINGE FOR IV PUSH (FOR BLOOD PRESSURE SUPPORT)
PREFILLED_SYRINGE | INTRAVENOUS | Status: DC | PRN
  Administered 2020-01-28 (×2): 40 ug via INTRAVENOUS

## 2020-01-28 MED ORDER — KETOROLAC TROMETHAMINE 30 MG/ML IJ SOLN
INTRAMUSCULAR | Status: AC
  Filled 2020-01-28: qty 1

## 2020-01-28 MED ORDER — IRRISEPT - 450ML BOTTLE WITH 0.05% CHG IN STERILE WATER, USP 99.95% OPTIME
TOPICAL | Status: DC | PRN
  Administered 2020-01-28: 450 mL

## 2020-01-28 MED ORDER — ENSURE PRE-SURGERY PO LIQD
296.0000 mL | Freq: Once | ORAL | Status: AC
  Administered 2020-01-28: 296 mL via ORAL
  Filled 2020-01-28: qty 296

## 2020-01-28 MED ORDER — PHENYLEPHRINE 40 MCG/ML (10ML) SYRINGE FOR IV PUSH (FOR BLOOD PRESSURE SUPPORT)
PREFILLED_SYRINGE | INTRAVENOUS | Status: AC
  Filled 2020-01-28: qty 10

## 2020-01-28 MED ORDER — VANCOMYCIN HCL 1000 MG IV SOLR
INTRAVENOUS | Status: AC
  Filled 2020-01-28: qty 1000

## 2020-01-28 MED ORDER — ONDANSETRON HCL 4 MG PO TABS: 4.0000 mg | ORAL_TABLET | Freq: Four times a day (QID) | ORAL | Status: DC | PRN

## 2020-01-28 MED ORDER — EPHEDRINE SULFATE-NACL 50-0.9 MG/10ML-% IV SOSY
PREFILLED_SYRINGE | INTRAVENOUS | Status: DC | PRN
  Administered 2020-01-28: 10 mg via INTRAVENOUS

## 2020-01-28 MED ORDER — SUCCINYLCHOLINE CHLORIDE 20 MG/ML IJ SOLN
INTRAMUSCULAR | Status: DC | PRN
  Administered 2020-01-28: 100 mg via INTRAVENOUS

## 2020-01-28 MED ORDER — CHLORHEXIDINE GLUCONATE 0.12 % MT SOLN: 15.0000 mL | Freq: Once | OROMUCOSAL | Status: AC

## 2020-01-28 MED ORDER — SUGAMMADEX SODIUM 200 MG/2ML IV SOLN
INTRAVENOUS | Status: DC | PRN
  Administered 2020-01-28: 100 mg via INTRAVENOUS

## 2020-01-28 MED ORDER — PHENYLEPHRINE HCL-NACL 10-0.9 MG/250ML-% IV SOLN
INTRAVENOUS | Status: DC | PRN
  Administered 2020-01-28: 25 ug/min via INTRAVENOUS

## 2020-01-28 MED ORDER — LIDOCAINE HCL (CARDIAC) PF 100 MG/5ML IV SOSY
PREFILLED_SYRINGE | INTRAVENOUS | Status: DC | PRN
  Administered 2020-01-28: 60 mg via INTRAVENOUS

## 2020-01-28 MED ORDER — SODIUM CHLORIDE 0.9 % IR SOLN
Status: DC | PRN
  Administered 2020-01-28: 3000 mL

## 2020-01-28 MED ORDER — TRANEXAMIC ACID-NACL 1000-0.7 MG/100ML-% IV SOLN
INTRAVENOUS | Status: AC
  Filled 2020-01-28: qty 100

## 2020-01-28 MED ORDER — FENTANYL CITRATE (PF) 100 MCG/2ML IJ SOLN
INTRAMUSCULAR | Status: DC | PRN
Start: 2020-01-28 — End: 2020-01-28
  Administered 2020-01-28 (×4): 50 ug via INTRAVENOUS

## 2020-01-28 MED ORDER — DEXAMETHASONE SODIUM PHOSPHATE 4 MG/ML IJ SOLN
INTRAMUSCULAR | Status: DC | PRN
  Administered 2020-01-28: 4 mg via INTRAVENOUS

## 2020-01-28 MED ORDER — KETOROLAC TROMETHAMINE 15 MG/ML IJ SOLN
7.5000 mg | Freq: Four times a day (QID) | INTRAMUSCULAR | Status: AC
  Administered 2020-01-28 – 2020-01-29 (×3): 7.5 mg via INTRAVENOUS
  Filled 2020-01-28 (×3): qty 1

## 2020-01-28 MED ORDER — LIDOCAINE 2% (20 MG/ML) 5 ML SYRINGE
INTRAMUSCULAR | Status: AC
  Filled 2020-01-28: qty 5

## 2020-01-28 MED ORDER — FENTANYL CITRATE (PF) 100 MCG/2ML IJ SOLN: 25.0000 ug | INTRAMUSCULAR | Status: DC | PRN

## 2020-01-28 MED ORDER — ONDANSETRON HCL 4 MG/2ML IJ SOLN
INTRAMUSCULAR | Status: AC
  Filled 2020-01-28: qty 2

## 2020-01-28 SURGICAL SUPPLY — 64 items
ALCOHOL 70% 16 OZ (MISCELLANEOUS) ×2 IMPLANT
BLADE SAGITTAL 25.0X1.19X90 (BLADE) ×2 IMPLANT
CHLORAPREP W/TINT 26 (MISCELLANEOUS) ×2 IMPLANT
COVER SURGICAL LIGHT HANDLE (MISCELLANEOUS) ×2 IMPLANT
CUP SECTOR GRIPTON 50MM (Cup) ×2 IMPLANT
DERMABOND ADVANCED (GAUZE/BANDAGES/DRESSINGS) ×2
DERMABOND ADVANCED .7 DNX12 (GAUZE/BANDAGES/DRESSINGS) ×2 IMPLANT
DRAPE C-ARM 42X72 X-RAY (DRAPES) ×2 IMPLANT
DRAPE STERI IOBAN 125X83 (DRAPES) ×2 IMPLANT
DRAPE U-SHAPE 47X51 STRL (DRAPES) ×6 IMPLANT
DRSG MEPILEX BORDER 4X8 (GAUZE/BANDAGES/DRESSINGS) ×2 IMPLANT
ELECT BLADE 4.0 EZ CLEAN MEGAD (MISCELLANEOUS) ×2
ELECT PENCIL ROCKER SW 15FT (MISCELLANEOUS) ×2 IMPLANT
ELECT REM PT RETURN 9FT ADLT (ELECTROSURGICAL) ×2
ELECTRODE BLDE 4.0 EZ CLN MEGD (MISCELLANEOUS) ×1 IMPLANT
ELECTRODE REM PT RTRN 9FT ADLT (ELECTROSURGICAL) ×1 IMPLANT
EVACUATOR 1/8 PVC DRAIN (DRAIN) IMPLANT
GLOVE BIO SURGEON STRL SZ8.5 (GLOVE) ×4 IMPLANT
GLOVE BIOGEL M 7.0 STRL (GLOVE) ×8 IMPLANT
GLOVE BIOGEL PI IND STRL 7.5 (GLOVE) ×2 IMPLANT
GLOVE BIOGEL PI IND STRL 8.5 (GLOVE) ×1 IMPLANT
GLOVE BIOGEL PI INDICATOR 7.5 (GLOVE) ×2
GLOVE BIOGEL PI INDICATOR 8.5 (GLOVE) ×1
GOWN STRL REUS W/ TWL LRG LVL3 (GOWN DISPOSABLE) ×2 IMPLANT
GOWN STRL REUS W/ TWL XL LVL3 (GOWN DISPOSABLE) ×1 IMPLANT
GOWN STRL REUS W/TWL 2XL LVL3 (GOWN DISPOSABLE) ×4 IMPLANT
GOWN STRL REUS W/TWL LRG LVL3 (GOWN DISPOSABLE) ×4
GOWN STRL REUS W/TWL XL LVL3 (GOWN DISPOSABLE) ×2
HANDPIECE INTERPULSE COAX TIP (DISPOSABLE) ×2
HEAD FEM STD 32X+9 STRL (Hips) ×2 IMPLANT
HOOD PEEL AWAY FACE SHEILD DIS (HOOD) ×6 IMPLANT
JET LAVAGE IRRISEPT WOUND (IRRIGATION / IRRIGATOR) ×2
KIT BASIN OR (CUSTOM PROCEDURE TRAY) ×2 IMPLANT
KIT TURNOVER KIT B (KITS) ×2 IMPLANT
LAVAGE JET IRRISEPT WOUND (IRRIGATION / IRRIGATOR) ×1 IMPLANT
LINER ACET PNNCL PLUS4 NEUTRAL (Hips) ×1 IMPLANT
MANIFOLD NEPTUNE II (INSTRUMENTS) ×2 IMPLANT
MARKER SKIN DUAL TIP RULER LAB (MISCELLANEOUS) ×4 IMPLANT
NEEDLE SPNL 18GX3.5 QUINCKE PK (NEEDLE) ×2 IMPLANT
NS IRRIG 1000ML POUR BTL (IV SOLUTION) ×2 IMPLANT
PACK TOTAL JOINT (CUSTOM PROCEDURE TRAY) ×2 IMPLANT
PACK UNIVERSAL I (CUSTOM PROCEDURE TRAY) ×2 IMPLANT
PAD ARMBOARD 7.5X6 YLW CONV (MISCELLANEOUS) ×4 IMPLANT
PINNACLE PLUS 4 NEUTRAL (Hips) ×2 IMPLANT
SAW OSC TIP CART 19.5X105X1.3 (SAW) ×2 IMPLANT
SCREW 6.5MMX35MM (Screw) ×2 IMPLANT
SET HNDPC FAN SPRY TIP SCT (DISPOSABLE) ×1 IMPLANT
SOL PREP POV-IOD 4OZ 10% (MISCELLANEOUS) ×2 IMPLANT
STEM TRI LOC BPS SZ4 W GRIPTON (Hips) ×1 IMPLANT
SUT ETHIBOND NAB CT1 #1 30IN (SUTURE) ×4 IMPLANT
SUT MNCRL AB 3-0 PS2 18 (SUTURE) ×2 IMPLANT
SUT MON AB 2-0 CT1 36 (SUTURE) ×2 IMPLANT
SUT VIC AB 1 CT1 27 (SUTURE) ×2
SUT VIC AB 1 CT1 27XBRD ANBCTR (SUTURE) ×1 IMPLANT
SUT VIC AB 2-0 CT1 27 (SUTURE) ×2
SUT VIC AB 2-0 CT1 TAPERPNT 27 (SUTURE) ×1 IMPLANT
SUT VLOC 180 0 24IN GS25 (SUTURE) ×2 IMPLANT
SYR 50ML LL SCALE MARK (SYRINGE) ×2 IMPLANT
TOWEL GREEN STERILE (TOWEL DISPOSABLE) ×2 IMPLANT
TOWEL GREEN STERILE FF (TOWEL DISPOSABLE) ×2 IMPLANT
TRAY FOLEY W/BAG SLVR 16FR (SET/KITS/TRAYS/PACK) ×2
TRAY FOLEY W/BAG SLVR 16FR ST (SET/KITS/TRAYS/PACK) ×1 IMPLANT
TRI LOC BPS SZ 4 W GRIPTON (Hips) ×2 IMPLANT
WATER STERILE IRR 1000ML POUR (IV SOLUTION) ×4 IMPLANT

## 2020-01-28 NOTE — Discharge Instructions (Signed)
°Dr. Karman Veney °Joint Replacement Specialist °[facility] °[address] °[place], [place] [address] °[phone] ° ° °TOTAL HIP REPLACEMENT POSTOPERATIVE DIRECTIONS ° ° ° °Hip Rehabilitation, Guidelines Following Surgery  ° °WEIGHT BEARING °Weight bearing as tolerated with assist device (walker, cane, etc) as directed, use it as long as suggested by your surgeon or therapist, typically at least 4-6 weeks. ° °The results of a hip operation are greatly improved after range of motion and muscle strengthening exercises. Follow all safety measures which are given to protect your hip. If any of these exercises cause increased pain or swelling in your joint, decrease the amount until you are comfortable again. Then slowly increase the exercises. Call your caregiver if you have problems or questions.  ° °HOME CARE INSTRUCTIONS  °Most of the following instructions are designed to prevent the dislocation of your new hip.  °Remove items at home which could result in a fall. This includes throw rugs or furniture in walking pathways.  °Continue medications as instructed at time of discharge. °· You may have some home medications which will be placed on hold until you complete the course of blood thinner medication. °· You may start showering once you are discharged home. Do not remove your dressing. °Do not put on socks or shoes without following the instructions of your caregivers.   °Sit on chairs with arms. Use the chair arms to help push yourself up when arising.  °Arrange for the use of a toilet seat elevator so you are not sitting low.  °· Walk with walker as instructed.  °You may resume a sexual relationship in one month or when given the OK by your caregiver.  °Use walker as long as suggested by your caregivers.  °You may put full weight on your legs and walk as much as is comfortable. °Avoid periods of inactivity such as sitting longer than an hour when not asleep. This helps prevent  blood clots.  °You may return to work once you are cleared by your surgeon.  °Do not drive a car for 6 weeks or until released by your surgeon.  °Do not drive while taking narcotics.  °Wear elastic stockings for two weeks following surgery during the day but you may remove then at night.  °Make sure you keep all of your appointments after your operation with all of your doctors and caregivers. You should call the office at the above phone number and make an appointment for approximately two weeks after the date of your surgery. °Please pick up a stool softener and laxative for home use as long as you are requiring pain medications. °· ICE to the affected hip every three hours for 30 minutes at a time and then as needed for pain and swelling. Continue to use ice on the hip for pain and swelling from surgery. You may notice swelling that will progress down to the foot and ankle.  This is normal after surgery.  Elevate the leg when you are not up walking on it.   °It is important for you to complete the blood thinner medication as prescribed by your doctor. °· Continue to use the breathing machine which will help keep your temperature down.  It is common for your temperature to cycle up and down following surgery, especially at night when you are not up moving around and exerting yourself.  The breathing machine keeps your lungs expanded and your temperature down. ° °RANGE OF MOTION AND STRENGTHENING EXERCISES  °These exercises are   designed to help you keep full movement of your hip joint. Follow your caregiver's or physical therapist's instructions. Perform all exercises about fifteen times, three times per day or as directed. Exercise both hips, even if you have had only one joint replacement. These exercises can be done on a training (exercise) mat, on the floor, on a table or on a bed. Use whatever works the best and is most comfortable for you. Use music or television while you are exercising so that the exercises  are a pleasant break in your day. This will make your life better with the exercises acting as a break in routine you can look forward to.  °Lying on your back, slowly slide your foot toward your buttocks, raising your knee up off the floor. Then slowly slide your foot back down until your leg is straight again.  °Lying on your back spread your legs as far apart as you can without causing discomfort.  °Lying on your side, raise your upper leg and foot straight up from the floor as far as is comfortable. Slowly lower the leg and repeat.  °Lying on your back, tighten up the muscle in the front of your thigh (quadriceps muscles). You can do this by keeping your leg straight and trying to raise your heel off the floor. This helps strengthen the largest muscle supporting your knee.  °Lying on your back, tighten up the muscles of your buttocks both with the legs straight and with the knee bent at a comfortable angle while keeping your heel on the floor.  ° °SKILLED REHAB INSTRUCTIONS: °If the patient is transferred to a skilled rehab facility following release from the hospital, a list of the current medications will be sent to the facility for the patient to continue.  When discharged from the skilled rehab facility, please have the facility set up the patient's Home Health Physical Therapy prior to being released. Also, the skilled facility will be responsible for providing the patient with their medications at time of release from the facility to include their pain medication and their blood thinner medication. If the patient is still at the rehab facility at time of the two week follow up appointment, the skilled rehab facility will also need to assist the patient in arranging follow up appointment in our office and any transportation needs. ° °MAKE SURE YOU:  °Understand these instructions.  °Will watch your condition.  °Will get help right away if you are not doing well or get worse. ° °Pick up stool softner and  laxative for home use following surgery while on pain medications. °Do not remove your dressing. °The dressing is waterproof--it is OK to take showers. °Continue to use ice for pain and swelling after surgery. °Do not use any lotions or creams on the incision until instructed by your surgeon. °Total Hip Protocol. ° ° °

## 2020-01-28 NOTE — Progress Notes (Signed)
Patient to short stay. White pearl earrings removed and was kept in a pink container at the bedside.

## 2020-01-28 NOTE — Anesthesia Procedure Notes (Signed)
Procedure Name: Intubation Performed by: Sonda Primes, CRNA Pre-anesthesia Checklist: Patient identified, Emergency Drugs available, Suction available and Patient being monitored Patient Re-evaluated:Patient Re-evaluated prior to induction Oxygen Delivery Method: Circle System Utilized and Circle system utilized Preoxygenation: Pre-oxygenation with 100% oxygen Induction Type: IV induction Ventilation: Mask ventilation without difficulty Laryngoscope Size: Glidescope and 3 Grade View: Grade I Tube type: Oral Number of attempts: 1 Airway Equipment and Method: Stylet and Oral airway Placement Confirmation: ETT inserted through vocal cords under direct vision,  positive ETCO2 and breath sounds checked- equal and bilateral Secured at: 21 cm Tube secured with: Tape Dental Injury: Teeth and Oropharynx as per pre-operative assessment

## 2020-01-28 NOTE — Anesthesia Preprocedure Evaluation (Addendum)
Anesthesia Evaluation  Patient identified by MRN, date of birth, ID band Patient awake    Reviewed: Allergy & Precautions, NPO status , Patient's Chart, lab work & pertinent test results  Airway Mallampati: II  TM Distance: >3 FB     Dental   Pulmonary neg pulmonary ROS,    breath sounds clear to auscultation       Cardiovascular hypertension, Pt. on medications  Rhythm:Regular Rate:Normal  History noted CG   Neuro/Psych negative neurological ROS  negative psych ROS   GI/Hepatic negative GI ROS, Neg liver ROS,   Endo/Other    Renal/GU Renal disease     Musculoskeletal  (+) Arthritis ,   Abdominal   Peds  Hematology   Anesthesia Other Findings   Reproductive/Obstetrics                           Anesthesia Physical Anesthesia Plan  ASA: III  Anesthesia Plan: General   Post-op Pain Management:    Induction: Intravenous  PONV Risk Score and Plan: 3 and Ondansetron and Dexamethasone  Airway Management Planned: Oral ETT  Additional Equipment:   Intra-op Plan:   Post-operative Plan: Extubation in OR  Informed Consent: I have reviewed the patients History and Physical, chart, labs and discussed the procedure including the risks, benefits and alternatives for the proposed anesthesia with the patient or authorized representative who has indicated his/her understanding and acceptance.     Dental advisory given  Plan Discussed with: CRNA and Anesthesiologist  Anesthesia Plan Comments:       Anesthesia Quick Evaluation

## 2020-01-28 NOTE — Consult Note (Signed)
ORTHOPAEDIC CONSULTATION  REQUESTING PHYSICIAN: Jerald Kief, MD  PCP:  Laurann Montana, MD  Chief Complaint: Right hip pain  HPI: Tracey Holmes is a 86 y.o. female with history of nephrolithiasis, hypertension, and hyperlipidemia who presents to the emergency department with right hip pain and inability to weight bear following a ground level fall at home.  Patient was ambulating to the mailbox, when she fell on her right side.  No preceding hip pain.  She ambulates without any assist device.  She lives alone.  She drives and does her own grocery shopping.  Denies other injuries.  Past Medical History:  Diagnosis Date  . Aneurysm, descending thoracic aorta    Proximal (1.5 cm)  . Cystic kidney disease   . Dyslipidemia   . HTN (hypertension)   . Personal history of kidney stones    No past surgical history on file. Social History   Socioeconomic History  . Marital status: Single    Spouse name: Not on file  . Number of children: Not on file  . Years of education: Not on file  . Highest education level: Not on file  Occupational History  . Not on file  Tobacco Use  . Smoking status: Never Smoker  . Smokeless tobacco: Never Used  Substance and Sexual Activity  . Alcohol use: No  . Drug use: No  . Sexual activity: Not on file  Other Topics Concern  . Not on file  Social History Narrative  . Not on file   Social Determinants of Health   Financial Resource Strain:   . Difficulty of Paying Living Expenses: Not on file  Food Insecurity:   . Worried About Programme researcher, broadcasting/film/video in the Last Year: Not on file  . Ran Out of Food in the Last Year: Not on file  Transportation Needs:   . Lack of Transportation (Medical): Not on file  . Lack of Transportation (Non-Medical): Not on file  Physical Activity:   . Days of Exercise per Week: Not on file  . Minutes of Exercise per Session: Not on file  Stress:   . Feeling of Stress : Not on file  Social Connections:   .  Frequency of Communication with Friends and Family: Not on file  . Frequency of Social Gatherings with Friends and Family: Not on file  . Attends Religious Services: Not on file  . Active Member of Clubs or Organizations: Not on file  . Attends Banker Meetings: Not on file  . Marital Status: Not on file   No family history on file. Allergies  Allergen Reactions  . Penicillins Rash   Prior to Admission medications   Medication Sig Start Date End Date Taking? Authorizing Provider  amLODipine (NORVASC) 5 MG tablet Take 5 mg by mouth daily.  12/29/10  Yes [provider]  aspirin 81 MG tablet Take 81 mg by mouth daily.     Yes [provider]  brimonidine (ALPHAGAN) 0.2 % ophthalmic solution Place 1 drop into both eyes 3 (three) times daily. 10/26/19  Yes [provider]  Cholecalciferol (VITAMIN D) 2000 UNITS tablet Take 4,000 Units by mouth daily.   Yes [provider]  denosumab (PROLIA) 60 MG/ML SOLN injection Inject 60 mg into the skin every 6 (six) months. Administer in upper arm, thigh, or abdomen   Yes [provider]  dorzolamide-timolol (COSOPT) 22.3-6.8 MG/ML ophthalmic solution Place 1 drop into both eyes 2 (two) times daily.  04/22/15  Yes [provider]  Multiple Vitamin (MULTIVITAMIN WITH MINERALS) TABS tablet Take 1 tablet by mouth daily.   Yes [provider]  pravastatin (PRAVACHOL) 80 MG tablet Take 1 tablet by mouth every evening. 12/28/19  Yes [provider]  ROCKLATAN 0.02-0.005 % SOLN Place 1 drop into both eyes at bedtime.  01/10/20  Yes [provider]   DG Elbow Complete Right  Result Date: 01/26/2020 CLINICAL DATA:  Pain, bruising, and swelling after a fall today EXAM: RIGHT ELBOW - COMPLETE 3+ VIEW COMPARISON:  None. FINDINGS: There is an old appearing ununited ossicle over the lateral humeral epicondyle. Mild degenerative changes in the elbow. No evidence of acute fracture or  dislocation. No focal bone lesion or bone destruction. No significant effusion. Soft tissues are unremarkable. IMPRESSION: No acute bony abnormalities. Electronically Signed   By: Burman Nieves M.D.   On: 01/26/2020 20:01   CT HIP RIGHT WO CONTRAST  Result Date: 01/26/2020 CLINICAL DATA:  Right hip fracture after fall EXAM: CT OF THE RIGHT HIP WITHOUT CONTRAST TECHNIQUE: Multidetector CT imaging of the right hip was performed according to the standard protocol. Multiplanar CT image reconstructions were also generated. COMPARISON:  01/26/2011 FINDINGS: Bones/Joint/Cartilage There is an impacted comminuted subcapital right femoral neck fracture, with varus and ventral angulation at the fracture site. No dislocation. No other acute displaced fractures. Visualized portions of the bony pelvis are unremarkable. Ligaments Suboptimally assessed by CT. Muscles and Tendons No evidence of muscular injury. Soft tissues There is subcutaneous edema overlying the lateral aspect right hip, consistent with contusion. No fluid collection or hematoma. There is a right inguinal hernia which contains a gas-filled appendix. Remaining visualized soft tissues are unremarkable. Reconstructed images demonstrate no additional findings. IMPRESSION: 1. Comminuted impacted subcapital right femoral neck fracture, with varus and ventral angulation at the fracture site. 2. Subcutaneous edema overlying the right hip consistent with contusion. Electronically Signed   By: Sharlet Salina M.D.   On: 01/26/2020 22:11   DG HIP UNILAT WITH PELVIS 2-3 VIEWS RIGHT  Result Date: 01/26/2020 CLINICAL DATA:  Fall today, right hip injury, right foot turned out with no range of motion. EXAM: DG HIP (WITH OR WITHOUT PELVIS) 2-3V RIGHT COMPARISON:  None. FINDINGS: Poorly evaluated right femoral neck fracture. No dislocation of the bilateral hips. Frontal views of the left hip demonstrates no acute displaced fracture. Frontal views of the pelvis  demonstrate no acute displaced fracture or diastasis. There is no evidence of severe arthropathy or other focal bone abnormality. Overlying subcutaneus soft tissue edema. Metallic density overlying the lumbar spine likely external to the patient. IMPRESSION: Right femoral neck fracture.  Consider CT for further evaluation. Electronically Signed   By: Tish Frederickson M.D.   On: 01/26/2020 20:02    Positive ROS: All other systems have been reviewed and were otherwise negative with the exception of those mentioned in the HPI and as above.  Physical Exam: General: Alert, no acute distress Cardiovascular: No pedal edema Respiratory: No cyanosis, no use of accessory musculature GI: No organomegaly, abdomen is soft and non-tender Skin: No lesions in the area of chief complaint Neurologic: Sensation intact distally Psychiatric: Patient is competent for consent with normal mood and affect Lymphatic: No axillary or cervical lymphadenopathy  MUSCULOSKELETAL: Examination of the right hip reveals no skin wounds or lesions.  She is shortened and externally rotated.  Pain with attempted logrolling of the hip.  Positive motor function dorsiflexion, plantarflexion, great toe extension.  Foot is warm and  well-perfused.  She reports intact sensation.  Assessment: Displaced right femoral neck fracture  Plan: I discussed the findings with the patient.  Recommend surgical treatment of her unstable hip fracture to allow for mobilization and pain control.  She is very active.  We discussed the risk, benefits, and alternatives to right total hip arthroplasty.  Plan for surgery today.  All questions solicited and answered.  The risks, benefits, and alternatives were discussed with the patient. There are risks associated with the surgery including, but not limited to, problems with anesthesia (death), infection, instability (giving out of the joint), dislocation, differences in leg length/angulation/rotation, fracture  of bones, loosening or failure of implants, hematoma (blood accumulation) which may require surgical drainage, blood clots, pulmonary embolism, nerve injury (foot drop and lateral thigh numbness), and blood vessel injury. The patient understands these risks and elects to proceed.   Jonette Pesa, MD [phone]    01/28/2020 6:59 AM

## 2020-01-28 NOTE — Progress Notes (Signed)
PROGRESS NOTE    Tracey Holmes  HEN:[id number] DOB: [birth year] DOA: 01/26/2020 PCP: Laurann Montana, MD    Brief Narrative:  86 y.o. female, with history of nephrolithiasis, hypertension, hyperlipidemia, cystic kidney disease, aorta aneurysm, presents to the ED with a chief complaint of fall.  Patient reports that she was walking to her mailbox when all of a sudden she was on the ground.  She reports it happened so fast that she is not sure what happened.  She reports that she did not feel any preceding symptoms like lightheadedness, chest pain, palpitations, or shortness of breath.  Patient reports that she did not hit her head.  She did not lose consciousness.  Patient reports that she fell onto her right side with her right leg pinned underneath her.  She was not able to get herself up, and neighbors came to help.  They were trying to get her to the house but she was unable to weight-bear at all on the right leg so they called 911.  Patient reports that she has no numbness, or tingling in her right leg  Assessment & Plan:   Active Problems:   Closed right hip fracture (HCC)   1. Comminuted impacted subcapital right femoral fracture 1. Ortho consulted and is following 2. Pt now s/p surgery 9/26 3. Flu with therapy recs 2. Leukocytosis 1. White blood cell count of 14.7 2. Likely stress reaction 3. Follow CBC trends 4. Hemodynamically stable 3. Hypertension 1. Blood pressure at arrival was 171/95 2. Continue home medications 3. BP stable and controlled at this time 4. Hyperlipidemia 1. Continue home medication as tolerated  DVT prophylaxis: Lovenox subq Code Status: Full Family Communication: Pt in room, family not at bedside  Status is: Inpatient  Remains inpatient appropriate because:Unsafe d/c plan   Dispo: The patient is from: Home              Anticipated d/c is to: Pending PT eval              Anticipated d/c date is: 2 days              Patient currently is not  medically stable to d/c.       Consultants:   Orthopedic Surgery  Procedures:   Total R hip arthoplasty 9/26  Antimicrobials: Anti-infectives (From admission, onward)   Start     Dose/Rate Route Frequency Ordered Stop   01/28/20 1400  ceFAZolin (ANCEF) IVPB 2g/100 mL premix        2 g 200 mL/hr over 30 Minutes Intravenous Every 6 hours 01/28/20 1028 01/29/20 0159   01/28/20 0600  ceFAZolin (ANCEF) IVPB 2g/100 mL premix        2 g 200 mL/hr over 30 Minutes Intravenous On call to O.R. 01/27/20 1629 01/28/20 0740       Subjective: Seen post-operatively. Pt complains of post op pain.   Objective: Vitals:   01/28/20 1027 01/28/20 1111 01/28/20 1350 01/28/20 1352  BP: 117/63 124/64 (!) 99/52 (!) 109/54  Pulse: 93 94 76 75  Resp: 18 16 16 16   Temp: 97.7 F (36.5 C) (!) 97.5 F (36.4 C) 98 F (36.7 C) 98 F (36.7 C)  TempSrc: Oral Oral Oral   SpO2: 100% 100% 96% 96%  Weight:      Height:        Intake/Output Summary (Last 24 hours) at 01/28/2020 1457 Last data filed at 01/28/2020 0935 Gross per 24 hour  Intake 850 ml  Output  1180 ml  Net -330 ml   Filed Weights   01/26/20 1740  Weight: 51.3 kg    Examination:  General exam: Appears calm and comfortable  Respiratory system: Clear to auscultation. Respiratory effort normal. Cardiovascular system: S1 & S2 heard, Regular Gastrointestinal system: Abdomen is nondistended, soft and nontender. No organomegaly or masses felt. Normal bowel sounds heard. Central nervous system: Alert and oriented. No focal neurological deficits. Extremities: Symmetric 5 x 5 power. Skin: No rashes, lesions  Psychiatry: Judgement and insight appear normal. Mood & affect appropriate.   Data Reviewed: I have personally reviewed following labs and imaging studies  CBC: Recent Labs  Lab 01/26/20 1908 01/27/20 0621 01/28/20 0155  WBC 14.7* 12.7* 11.4*  NEUTROABS 12.1* 9.5* 8.0*  HGB 14.8 15.3* 14.1  HCT 46.0 46.4* 43.7  MCV 98.9  96.3 96.9  PLT 243 237 220   Basic Metabolic Panel: Recent Labs  Lab 01/26/20 1908 01/27/20 0621 01/28/20 0155  NA 137 138 136  K 3.9 4.3 4.2  CL 106 103 106  CO2 22 23 21*  GLUCOSE 139* 140* 114*  BUN 14 13 15   CREATININE 0.51 0.61 0.81  CALCIUM 8.4* 8.6* 8.3*  MG  --  2.1  --    GFR: Estimated Creatinine Clearance: 41.9 mL/min (by C-G formula based on SCr of 0.81 mg/dL). Liver Function Tests: Recent Labs  Lab 01/27/20 0621  AST 25  ALT 23  ALKPHOS 44  BILITOT 1.1  PROT 6.9  ALBUMIN 4.0   No results for input(s): LIPASE, AMYLASE in the last 168 hours. No results for input(s): AMMONIA in the last 168 hours. Coagulation Profile: Recent Labs  Lab 01/26/20 1908 01/27/20 0621  INR 0.9 0.9   Cardiac Enzymes: No results for input(s): CKTOTAL, CKMB, CKMBINDEX, TROPONINI in the last 168 hours. BNP (last 3 results) No results for input(s): PROBNP in the last 8760 hours. HbA1C: No results for input(s): HGBA1C in the last 72 hours. CBG: No results for input(s): GLUCAP in the last 168 hours. Lipid Profile: No results for input(s): CHOL, HDL, LDLCALC, TRIG, CHOLHDL, LDLDIRECT in the last 72 hours. Thyroid Function Tests: Recent Labs    01/27/20 0621  TSH 1.039   Anemia Panel: No results for input(s): VITAMINB12, FOLATE, FERRITIN, TIBC, IRON, RETICCTPCT in the last 72 hours. Sepsis Labs: No results for input(s): PROCALCITON, LATICACIDVEN in the last 168 hours.  Recent Results (from the past 240 hour(s))  Respiratory Panel by RT PCR (Flu A&B, Covid) - Nasopharyngeal Swab     Status: None   Collection Time: 01/26/20 10:50 PM   Specimen: Nasopharyngeal Swab  Result Value Ref Range Status   SARS Coronavirus 2 by RT PCR NEGATIVE NEGATIVE Final    Comment: (NOTE) SARS-CoV-2 target nucleic acids are NOT DETECTED.  The SARS-CoV-2 RNA is generally detectable in upper respiratoy specimens during the acute phase of infection. The lowest concentration of SARS-CoV-2  viral copies this assay can detect is 131 copies/mL. A negative result does not preclude SARS-Cov-2 infection and should not be used as the sole basis for treatment or other patient management decisions. A negative result may occur with  improper specimen collection/handling, submission of specimen other than nasopharyngeal swab, presence of viral mutation(s) within the areas targeted by this assay, and inadequate number of viral copies (<131 copies/mL). A negative result must be combined with clinical observations, patient history, and epidemiological information. The expected result is Negative.  Fact Sheet for Patients:  01/28/20  Fact Sheet for Healthcare Providers:  https://www.young.biz/https://www.fda.gov/media/142435/download  This test is no t yet approved or cleared by the [place] FDA and  has been authorized for detection and/or diagnosis of SARS-CoV-2 by FDA under an Emergency Use Authorization (EUA). This EUA will remain  in effect (meaning this test can be used) for the duration of the COVID-19 declaration under Section 564(b)(1) of the Act, 21 U.S.C. section 360bbb-3(b)(1), unless the authorization is terminated or revoked sooner.     Influenza A by PCR NEGATIVE NEGATIVE Final   Influenza B by PCR NEGATIVE NEGATIVE Final    Comment: (NOTE) The Xpert Xpress SARS-CoV-2/FLU/RSV assay is intended as an aid in  the diagnosis of influenza from Nasopharyngeal swab specimens and  should not be used as a sole basis for treatment. Nasal washings and  aspirates are unacceptable for Xpert Xpress SARS-CoV-2/FLU/RSV  testing.  Fact Sheet for Patients: https://www.moore.com/https://www.fda.gov/media/142436/download  Fact Sheet for Healthcare Providers: https://www.young.biz/https://www.fda.gov/media/142435/download  This test is not yet approved or cleared by the [place] FDA and  has been authorized for detection and/or diagnosis of SARS-CoV-2 by  FDA under an Emergency Use Authorization (EUA).  This EUA will remain  in effect (meaning this test can be used) for the duration of the  Covid-19 declaration under Section 564(b)(1) of the Act, 21  U.S.C. section 360bbb-3(b)(1), unless the authorization is  terminated or revoked. Performed at [facility], [address] [place], [place] [address]   Surgical pcr screen     Status: None   Collection Time: 01/27/20  5:25 PM   Specimen: Nasal Mucosa; Nasal Swab  Result Value Ref Range Status   MRSA, PCR NEGATIVE NEGATIVE Final   Staphylococcus aureus NEGATIVE NEGATIVE Final    Comment: (NOTE) The Xpert SA Assay (FDA approved for NASAL specimens in patients 84 years of age and older), is one component of a comprehensive surveillance program. It is not intended to diagnose infection nor to guide or monitor treatment. Performed at [facility], [address] [place], [place] [address]      Radiology Studies: DG Elbow Complete Right  Result Date: 01/26/2020 CLINICAL DATA:  Pain, bruising, and swelling after a fall today EXAM: RIGHT ELBOW - COMPLETE 3+ VIEW COMPARISON:  None. FINDINGS: There is an old appearing ununited ossicle over the lateral humeral epicondyle. Mild degenerative changes in the elbow. No evidence of acute fracture or dislocation. No focal bone lesion or bone destruction. No significant effusion. Soft tissues are unremarkable. IMPRESSION: No acute bony abnormalities. Electronically Signed   By: Burman NievesWilliam  Stevens M.D.   On: 01/26/2020 20:01   Pelvis Portable  Result Date: 01/28/2020 CLINICAL DATA:  Status post right hip replacement EXAM: PORTABLE PELVIS 1-2 VIEWS COMPARISON:  Intraoperative films from earlier in the same day. FINDINGS: Right hip prosthesis is now seen in satisfactory position. No acute fracture or dislocation is noted. No soft tissue abnormality is noted. IMPRESSION: Status post right hip replacement. Electronically Signed   By: Alcide CleverMark  Lukens M.D.   On: 01/28/2020 11:01   CT HIP RIGHT WO CONTRAST   Result Date: 01/26/2020 CLINICAL DATA:  Right hip fracture after fall EXAM: CT OF THE RIGHT HIP WITHOUT CONTRAST TECHNIQUE: Multidetector CT imaging of the right hip was performed according to the standard protocol. Multiplanar CT image reconstructions were also generated. COMPARISON:  01/26/2011 FINDINGS: Bones/Joint/Cartilage There is an impacted comminuted subcapital right femoral neck fracture, with varus and ventral angulation at the fracture site. No dislocation. No other acute displaced fractures. Visualized portions of the bony pelvis  are unremarkable. Ligaments Suboptimally assessed by CT. Muscles and Tendons No evidence of muscular injury. Soft tissues There is subcutaneous edema overlying the lateral aspect right hip, consistent with contusion. No fluid collection or hematoma. There is a right inguinal hernia which contains a gas-filled appendix. Remaining visualized soft tissues are unremarkable. Reconstructed images demonstrate no additional findings. IMPRESSION: 1. Comminuted impacted subcapital right femoral neck fracture, with varus and ventral angulation at the fracture site. 2. Subcutaneous edema overlying the right hip consistent with contusion. Electronically Signed   By: Sharlet Salina M.D.   On: 01/26/2020 22:11   DG C-Arm 1-60 Min  Result Date: 01/28/2020 CLINICAL DATA:  Post total right hip arthroplasty. EXAM: DG C-ARM 1-60 MIN FLUOROSCOPY TIME:  Fluoroscopy Time:  10 seconds. Number of Acquired Spot Images: 0 COMPARISON:  January 26, 2020 FINDINGS: Intraoperative fluoroscopic images demonstrate resection of the right femoral head and neck and placement of total right hip prosthesis, with normal alignment of the prosthetic components. IMPRESSION: Post total right hip arthroplasty without evidence of immediate complications. Electronically Signed   By: Ted Mcalpine M.D.   On: 01/28/2020 11:03   DG HIP OPERATIVE UNILAT W OR W/O PELVIS RIGHT  Result Date: 01/28/2020 CLINICAL  DATA:  Right hip arthroplasty. EXAM: OPERATIVE RIGHT HIP (WITH PELVIS IF PERFORMED) TECHNIQUE: Fluoroscopic spot image(s) were submitted for interpretation post-operatively. COMPARISON:  January 26, 2020 FINDINGS: Post total right hip arthroplasty with resection of the right femoral head and neck. Normal alignment of the prosthetic components. No evidence of fractures. IMPRESSION: Post total right hip arthroplasty without evidence of immediate complications. Electronically Signed   By: Ted Mcalpine M.D.   On: 01/28/2020 11:00   DG HIP UNILAT WITH PELVIS 2-3 VIEWS RIGHT  Result Date: 01/26/2020 CLINICAL DATA:  Fall today, right hip injury, right foot turned out with no range of motion. EXAM: DG HIP (WITH OR WITHOUT PELVIS) 2-3V RIGHT COMPARISON:  None. FINDINGS: Poorly evaluated right femoral neck fracture. No dislocation of the bilateral hips. Frontal views of the left hip demonstrates no acute displaced fracture. Frontal views of the pelvis demonstrate no acute displaced fracture or diastasis. There is no evidence of severe arthropathy or other focal bone abnormality. Overlying subcutaneus soft tissue edema. Metallic density overlying the lumbar spine likely external to the patient. IMPRESSION: Right femoral neck fracture.  Consider CT for further evaluation. Electronically Signed   By: Tish Frederickson M.D.   On: 01/26/2020 20:02    Scheduled Meds: . amLODipine  5 mg Oral Daily  . aspirin EC  81 mg Oral Daily  . brimonidine  1 drop Both Eyes TID  . docusate sodium  100 mg Oral BID  . [START ON 01/29/2020] enoxaparin (LOVENOX) injection  40 mg Subcutaneous Q24H  . ketorolac  7.5 mg Intravenous Q6H  . pravastatin  40 mg Oral Daily   Continuous Infusions: .  ceFAZolin (ANCEF) IV 2 g (01/28/20 1436)     LOS: 2 days   Rickey Barbara, MD [facility] Pager On Amion  If 7PM-7AM, please contact night-coverage 01/28/2020, 2:57 PM

## 2020-01-28 NOTE — Transfer of Care (Signed)
Immediate Anesthesia Transfer of Care Note  Patient: Tracey Holmes  Procedure(s) Performed: TOTAL HIP ARTHROPLASTY ANTERIOR APPROACH (Right Hip)  Patient Location: PACU  Anesthesia Type:General  Level of Consciousness: awake, alert , oriented and patient cooperative  Airway & Oxygen Therapy: Patient Spontanous Breathing  Post-op Assessment: Report given to RN and Post -op Vital signs reviewed and stable  Post vital signs: Reviewed and stable  Last Vitals:  Vitals Value Taken Time  BP 105/50 01/28/20 0938  Temp    Pulse 94 01/28/20 0939  Resp 14 01/28/20 0939  SpO2 97 % 01/28/20 0939  Vitals shown include unvalidated device data.  Last Pain:  Vitals:   01/28/20 0300  TempSrc: Oral  PainSc:          Complications: No complications documented.

## 2020-01-28 NOTE — Anesthesia Postprocedure Evaluation (Signed)
Anesthesia Post Note  Patient: Tracey Holmes  Procedure(s) Performed: TOTAL HIP ARTHROPLASTY ANTERIOR APPROACH (Right Hip)     Patient location during evaluation: PACU Anesthesia Type: General Level of consciousness: awake Pain management: pain level controlled Vital Signs Assessment: post-procedure vital signs reviewed and stable Respiratory status: spontaneous breathing Cardiovascular status: stable Postop Assessment: no apparent nausea or vomiting Anesthetic complications: no   No complications documented.  Last Vitals:  Vitals:   01/28/20 0300 01/28/20 0930  BP: 137/62 (!) (P) 105/50  Pulse: 81 92  Resp: 16 (P) 18  Temp: 37.1 C (!) 36.3 C  SpO2: 97% (P) 96%    Last Pain:  Vitals:   01/28/20 0300  TempSrc: Oral  PainSc:                  [name]

## 2020-01-28 NOTE — Op Note (Signed)
OPERATIVE REPORT  SURGEON: Samson Frederic, MD   ASSISTANT: Alphonsa Overall, PA-C  PREOPERATIVE DIAGNOSIS: Displaced Right femoral neck fracture.   POSTOPERATIVE DIAGNOSIS: Displaced Right femoral neck fracture.   PROCEDURE: Right total hip arthroplasty, anterior approach.   IMPLANTS: DePuy Tri Lock stem, size 4, hi offset. DePuy Pinnacle Cup, size 50 mm. DePuy Altrx liner, size 32 by 50 mm, +4 neutral. DePuy Biolox metal head ball, size 32 + 9 mm. 6.5 mm cancellous bone screw x1.  ANESTHESIA:  General  ANTIBIOTICS: 2g ancef.  ESTIMATED BLOOD LOSS:-500 mL    DRAINS: None.  COMPLICATIONS: None   CONDITION: PACU - hemodynamically stable.   BRIEF CLINICAL NOTE: Tracey Holmes is a 86 y.o. female with a displaced Right femoral neck fracture. The patient was admitted to the hospitalist service and underwent perioperative risk stratification and medical optimization. The risks, benefits, and alternatives to total hip arthroplasty were explained, and the patient elected to proceed.  PROCEDURE IN DETAIL: The patient was taken to the operating room and general anesthesia was induced on the hospital bed.  The patient was then positioned on the Hana table.  All bony prominences were well padded.  The hip was prepped and draped in the normal sterile surgical fashion.  A time-out was called verifying side and site of surgery. Antibiotics were given within 60 minutes of beginning the procedure.  The direct anterior approach to the hip was performed through the Hueter interval.  Lateral femoral circumflex vessels were treated with the Auqumantys. The anterior capsule was exposed and an inverted T capsulotomy was made.  Fracture hematoma was encountered and evacuated. The patient was found to have a comminuted Right subcapital femoral neck fracture.  I freshened the femoral neck cut with a saw.  I removed the femoral neck fragment.  A corkscrew was placed into the head and the head was  removed.  This was passed to the back table and was measured.   Acetabular exposure was achieved, and the pulvinar and labrum were excised. Sequential reaming of the acetabulum was then performed up to a size 49 mm reamer. A 50 mm cup was then opened and impacted into place at approximately 40 degrees of abduction and 20 degrees of anteversion. I elected to augment the press fit fixation with a single 6.5 mm cancellous bone screw. The final polyethylene liner was impacted into place.    I then gained femoral exposure taking care to protect the abductors and greater trochanter.  This was performed using standard external rotation, extension, and adduction.  The capsule was peeled off the inner aspect of the greater trochanter, taking care to preserve the short external rotators. A cookie cutter was used to enter the femoral canal, and then the femoral canal finder was used to confirm location.  I then sequentially broached up to a size 4.  Calcar planer was used on the femoral neck remnant.  I paced a hi neck and a trial head ball. The hip was reduced.  Leg lengths were checked fluoroscopically.  The hip was dislocated and trial components were removed.  I placed the real stem followed by the real spacer and head ball.  The hip was reduced.  Fluoroscopy was used to confirm component position and leg lengths.  At 90 degrees of external rotation and extension, the hip was stable to an anterior directed force.   The wound was copiously irrigated with Irrisept solution and normal saline using pule lavage.  Marcaine solution was injected into the periarticular  soft tissue.  The wound was closed in layers using #1 Vicryl and V-Loc for the fascia, 2-0 Vicryl for the subcutaneous fat, 2-0 Monocryl for the deep dermal layer, 3-0 running Monocryl subcuticular stitch and glue for the skin.  Once the glue was fully dried, an Aquacell Ag dressing was applied.  The patient was then awakened from anesthesia and transported to  the recovery room in stable condition.  Sponge, needle, and instrument counts were correct at the end of the case x2.  The patient tolerated the procedure well and there were no known complications.  Please note that a surgical assistant was a medical necessity for this procedure to perform it in a safe and expeditious manner. Assistant was necessary to provide appropriate retraction of vital neurovascular structures, to prevent femoral fracture, and to allow for anatomic placement of the prosthesis.  POSTOPERATIVE PLAN: Postoperatively, the patient be readmitted to the hospitalist service.  She may weight-bear as tolerated right lower extremity with a walker.  Mobilize out of bed with physical therapy.  Beginning tomorrow morning, start Lovenox for DVT prophylaxis while in house, and discharged on aspirin 81 mg p.o. twice daily.  Return to the office in 2 weeks for routine postop care.

## 2020-01-29 ENCOUNTER — Encounter (HOSPITAL_COMMUNITY): Payer: Self-pay | Admitting: Orthopedic Surgery

## 2020-01-29 LAB — BASIC METABOLIC PANEL
Anion gap: 10 (ref 5–15)
BUN: 26 mg/dL — ABNORMAL HIGH (ref 8–23)
CO2: 20 mmol/L — ABNORMAL LOW (ref 22–32)
Calcium: 7.9 mg/dL — ABNORMAL LOW (ref 8.9–10.3)
Chloride: 109 mmol/L (ref 98–111)
Creatinine, Ser: 0.98 mg/dL (ref 0.44–1.00)
GFR calc Af Amer: >60 mL/min (ref >60)
GFR calc non Af Amer: 53 mL/min — ABNORMAL LOW (ref >60)
Glucose, Bld: 207 mg/dL — ABNORMAL HIGH (ref 70–99)
Potassium: 3.7 mmol/L (ref 3.5–5.1)
Sodium: 139 mmol/L (ref 135–145)

## 2020-01-29 LAB — CBC
HCT: 30.9 % — ABNORMAL LOW (ref 36.0–46.0)
Hemoglobin: 9.9 g/dL — ABNORMAL LOW (ref 12.0–15.0)
MCH: 32.1 pg (ref 26.0–34.0)
MCHC: 32 g/dL (ref 30.0–36.0)
MCV: 100.3 fL — ABNORMAL HIGH (ref 80.0–100.0)
Platelets: 192 10*3/uL (ref 150–400)
RBC: 3.08 MIL/uL — ABNORMAL LOW (ref 3.87–5.11)
RDW: 12.6 % (ref 11.5–15.5)
WBC: 10.8 10*3/uL — ABNORMAL HIGH (ref 4.0–10.5)
nRBC: 0 % (ref 0.0–0.2)

## 2020-01-29 MED ORDER — HYDROCODONE-ACETAMINOPHEN 5-325 MG PO TABS: 1.0000 | ORAL_TABLET | ORAL | 0 refills | Status: AC | PRN

## 2020-01-29 MED ORDER — ASPIRIN 81 MG PO CHEW: 81.0000 mg | CHEWABLE_TABLET | Freq: Two times a day (BID) | ORAL | 0 refills | Status: AC

## 2020-01-29 NOTE — Evaluation (Signed)
Physical Therapy Evaluation Patient Details Name: Tracey Holmes MRN: [id number] DOB: April 27, 1934 Today's Date: 01/29/2020   History of Present Illness  86yo female who reports a fall at home, found to have comminuted impacted subcapital R femoral fracture. Received R anterior approach THA 01/28/20. PMH HTN, HLD, thoracic aneurysm  Clinical Impression   Patient received in bed, very pleasant and cooperative with therapy today. See below for mobility/assist levels. Has significant difficulty in sequencing and problem solving with mobility and needed Max VC/TC to maintain anterior hip precautions and initiate really any mobility. Very difficult for her to remember and apply anterior hip precautions in general. Able to get to standing by EOB with Mod-maxA, however halfway through stand-pivot transfer she randomly and suddenly lost her balance posteriorly and needed MaxA for controlled, guided descent into recliner chair.  Unable to safely ambulate due to difficulty with weight shifting onto and progressing R LE as well as severe balance impairment. Left up in recliner positioned to comfort with all needs met, nursing staff aware of patient status. In definite need of SNF to faciliate safe return home.     Follow Up Recommendations SNF;Supervision/Assistance - 24 hour    Equipment Recommendations  Rolling walker with 5" wheels;3in1 (PT)    Recommendations for Other Services       Precautions / Restrictions Precautions Precautions: Fall;Other (comment) Precaution Comments: R LE WBAT with anterior hip precautions Restrictions Weight Bearing Restrictions: Yes RLE Weight Bearing: Weight bearing as tolerated      Mobility  Bed Mobility Overal bed mobility: Needs Assistance Bed Mobility: Supine to Sit     Supine to sit: Mod assist;HOB elevated     General bed mobility comments: ModA to maintain anterior hip precautions/pivot hips around to EOB  Transfers Overall transfer level: Needs  assistance Equipment used: Rolling walker (2 wheeled) Transfers: Sit to/from [facility] Sit to Stand: Mod assist;Max assist Stand pivot transfers: Max assist       General transfer comment: needed MaxA to boost to upright standing position on first attempt; on second attempt able to perform with ModA but heavy VC/TC for hand placement and sequencing; MaxA to complete STP transfer safely due to sudden LOB posteriorly- needed MaxA for guided and controlled descent to chair  Ambulation/Gait             General Gait Details: unsafe- will need chair follow  Stairs            Wheelchair Mobility    Modified Rankin (Stroke Patients Only)       Balance Overall balance assessment: History of Falls;Needs assistance Sitting-balance support: Bilateral upper extremity supported;Feet supported Sitting balance-Leahy Scale: Fair Sitting balance - Comments: close S for safety   Standing balance support: Bilateral upper extremity supported;During functional activity Standing balance-Leahy Scale: Poor Standing balance comment: hx of falls and sudden posterior LOB needed MaxA for controlled descent to chair                             Pertinent Vitals/Pain Pain Assessment: Faces Faces Pain Scale: Hurts little more Pain Location: RLE with weight bearing Pain Descriptors / Indicators: Aching;Discomfort;Sore Pain Intervention(s): Limited activity within patient's tolerance;Monitored during session;Repositioned    Home Living Family/patient expects to be discharged to:: Private residence Living Arrangements: Alone Available Help at Discharge: Family;Available 24 hours/day;Friend(s) (nephew's wife is comign tomorrow and can stay for "awhile"; also has a close friend who checks on her all  the time) Type of Home: House Home Access: Stairs to enter Entrance Stairs-Rails: Can reach both Entrance Stairs-Number of Steps: 4 steps in the front, 2 steps in the  back; talking about building a ramp soon Home Layout: One level Home Equipment: Marine scientist - single point Additional Comments: friend has a walker she can borrow if needed    Prior Function Level of Independence: Independent               Hand Dominance        Extremity/Trunk Assessment   Upper Extremity Assessment Upper Extremity Assessment: Defer to OT evaluation    Lower Extremity Assessment Lower Extremity Assessment: Generalized weakness    Cervical / Trunk Assessment Cervical / Trunk Assessment: Kyphotic  Communication   Communication: No difficulties  Cognition Arousal/Alertness: Awake/alert Behavior During Therapy: WFL for tasks assessed/performed Overall Cognitive Status: No family/caregiver present to determine baseline cognitive functioning Area of Impairment: Following commands;Safety/judgement;Awareness;Problem solving                   Current Attention Level: Sustained Memory: Decreased recall of precautions Following Commands: Follows one step commands with increased time;Follows one step commands inconsistently Safety/Judgement: Decreased awareness of safety;Decreased awareness of deficits Awareness: Intellectual Problem Solving: Slow processing;Decreased initiation;Difficulty sequencing;Requires verbal cues;Requires tactile cues General Comments: very slow processing time and repated simple cues to maintain hip precautions/for sequencing for all transfers      General Comments      Exercises     Assessment/Plan    PT Assessment Patient needs continued PT services  PT Problem List Decreased strength;Decreased cognition;Decreased knowledge of use of DME;Decreased activity tolerance;Decreased safety awareness;Decreased balance;Decreased knowledge of precautions;Decreased mobility;Decreased coordination       PT Treatment Interventions DME instruction;Balance training;Gait training;Stair training;Cognitive remediation;Functional  mobility training;Patient/family education;Therapeutic activities;Wheelchair mobility training;Therapeutic exercise    PT Goals (Current goals can be found in the Care Plan section)  Acute Rehab PT Goals Patient Stated Goal: go home when ready PT Goal Formulation: With patient Time For Goal Achievement: 02/12/20 Potential to Achieve Goals: Fair    Frequency Min 3X/week   Barriers to discharge        Co-evaluation               AM-PAC PT "6 Clicks" Mobility  Outcome Measure Help needed turning from your back to your side while in a flat bed without using bedrails?: A Little Help needed moving from lying on your back to sitting on the side of a flat bed without using bedrails?: A Lot Help needed moving to and from a bed to a chair (including a wheelchair)?: A Lot Help needed standing up from a chair using your arms (e.g., wheelchair or bedside chair)?: A Lot Help needed to walk in hospital room?: Total Help needed climbing 3-5 steps with a railing? : Total 6 Click Score: 11    End of Session Equipment Utilized During Treatment: Gait belt Activity Tolerance: Patient tolerated treatment well Patient left: in chair;with call bell/phone within reach Nurse Communication: Mobility status;Weight bearing status;Precautions;Need for lift equipment PT Visit Diagnosis: Unsteadiness on feet (R26.81);Difficulty in walking, not elsewhere classified (R26.2);History of falling (Z91.81);Muscle weakness (generalized) (M62.81)    Time: 8786-7672 PT Time Calculation (min) (ACUTE ONLY): 33 min   Charges:   PT Evaluation $PT Eval Low Complexity: 1 Low PT Treatments $Therapeutic Activity: 8-22 mins        Ann Lions PT, DPT, PN1   Supplemental Physical Therapist [place]  Pager [phone] Acute Rehab Office [phone]

## 2020-01-29 NOTE — Progress Notes (Signed)
    Subjective:  Patient reports pain as mild to moderate.  Denies N/V/CP/SOB. Endorses flatus  Objective:   VITALS:   Vitals:   01/29/20 0004 01/29/20 0449 01/29/20 0453 01/29/20 0729  BP: 128/64  125/62 (!) 103/53  Pulse: 72  75 75  Resp: 16  15 17   Temp: 98.1 F (36.7 C)  (!) 97.3 F (36.3 C) 97.9 F (36.6 C)  TempSrc: Oral  Oral Oral  SpO2: 97%  99% 99%  Weight:  54.4 kg    Height:        NAD ABD soft Neurovascular intact Sensation intact distally Intact pulses distally Dorsiflexion/Plantar flexion intact Incision: dressing C/D/I   Lab Results  Component Value Date   WBC 11.4 (H) 01/28/2020   HGB 14.1 01/28/2020   HCT 43.7 01/28/2020   MCV 96.9 01/28/2020   PLT 220 01/28/2020   BMET    Component Value Date/Time   NA 136 01/28/2020 0155   K 4.2 01/28/2020 0155   CL 106 01/28/2020 0155   CO2 21 (L) 01/28/2020 0155   GLUCOSE 114 (H) 01/28/2020 0155   BUN 15 01/28/2020 0155   CREATININE 0.81 01/28/2020 0155   CREATININE 0.60 01/29/2014 1442   CALCIUM 8.3 (L) 01/28/2020 0155   GFRNONAA >60 01/28/2020 0155   GFRAA >60 01/28/2020 0155     Assessment/Plan: 1 Day Post-Op   Active Problems:   Closed right hip fracture (HCC)   WBAT with walker DVT ppx: Lovenox, SCDs, TEDS, ASA on discharge PO pain control PT/OT Dispo: D/C planning per hospitalist, patient lives alone but has family coming to stay with her. F/U with Dr. 01/30/2020 in two weeks    Linna Caprice 01/29/2020, 8:04 AM  [facility] is now [facility] [address], [address], [place], [place] Phone: [phone] [email or web address] Facebook  [phone]

## 2020-01-29 NOTE — Progress Notes (Signed)
PROGRESS NOTE    Tracey Holmes  BPZ:[id number] DOB: [birth year] DOA: 01/26/2020 PCP: Laurann Montana, MD    Brief Narrative:  86 y.o. female, with history of nephrolithiasis, hypertension, hyperlipidemia, cystic kidney disease, aorta aneurysm, presents to the ED with a chief complaint of fall.  Patient reports that she was walking to her mailbox when all of a sudden she was on the ground.  She reports it happened so fast that she is not sure what happened.  She reports that she did not feel any preceding symptoms like lightheadedness, chest pain, palpitations, or shortness of breath.  Patient reports that she did not hit her head.  She did not lose consciousness.  Patient reports that she fell onto her right side with her right leg pinned underneath her.  She was not able to get herself up, and neighbors came to help.  They were trying to get her to the house but she was unable to weight-bear at all on the right leg so they called 911.  Patient reports that she has no numbness, or tingling in her right leg  Assessment & Plan:   Active Problems:   Closed right hip fracture (HCC)   1. Comminuted impacted subcapital right femoral fracture 1. Ortho consulted and is following 2. Pt now s/p surgery 9/26 3. Therapy recs were for SNF. Discussed with TOC. Family wishes to take home with [facility] services. TOC following 2. Leukocytosis 1. White blood cell count of 14.7 2. Likely stress reaction 3. WBC resolved post-op 4. Hemodynamically stable 3. Hypertension 1. Blood pressure at arrival was 171/95, suspect elevated secondary to pain 2. Continue home medications 3. BP now stable and controlled at this time 4. Hyperlipidemia 1. Continue home medication as pt tolerates  DVT prophylaxis: Lovenox subq Code Status: Full Family Communication: Pt in room, family not at bedside  Status is: Inpatient  Remains inpatient appropriate because:Unsafe d/c plan   Dispo: The patient is from: Home               Anticipated d/c is to: Home with [facility]              Anticipated d/c date is: 1 day              Patient currently is medically stable to d/c. Pending discharge planning   Consultants:   Orthopedic Surgery  Procedures:   Total R hip arthoplasty 9/26  Antimicrobials: Anti-infectives (From admission, onward)   Start     Dose/Rate Route Frequency Ordered Stop   01/28/20 1400  ceFAZolin (ANCEF) IVPB 2g/100 mL premix        2 g 200 mL/hr over 30 Minutes Intravenous Every 6 hours 01/28/20 1028 01/28/20 2150   01/28/20 0600  ceFAZolin (ANCEF) IVPB 2g/100 mL premix        2 g 200 mL/hr over 30 Minutes Intravenous On call to O.R. 01/27/20 1629 01/28/20 0740      Subjective: Without complaints this AM  Objective: Vitals:   01/29/20 0004 01/29/20 0449 01/29/20 0453 01/29/20 0729  BP: 128/64  125/62 (!) 103/53  Pulse: 72  75 75  Resp: 16  15 17   Temp: 98.1 F (36.7 C)  (!) 97.3 F (36.3 C) 97.9 F (36.6 C)  TempSrc: Oral  Oral Oral  SpO2: 97%  99% 99%  Weight:  54.4 kg    Height:        Intake/Output Summary (Last 24 hours) at 01/29/2020 1554 Last data filed at 01/29/2020 0700  Gross per 24 hour  Intake 350 ml  Output 1250 ml  Net -900 ml   Filed Weights   01/26/20 1740 01/29/20 0449  Weight: 51.3 kg 54.4 kg    Examination: General exam: Awake, laying in bed, in nad Respiratory system: Normal respiratory effort, no wheezing Cardiovascular system: regular rate, s1, s2 Gastrointestinal system: Soft, nondistended, positive BS Central nervous system: CN2-12 grossly intact, strength intact Extremities: Perfused, no clubbing Skin: Normal skin turgor, no notable skin lesions seen Psychiatry: Mood normal // no visual hallucinations   Data Reviewed: I have personally reviewed following labs and imaging studies  CBC: Recent Labs  Lab 01/26/20 1908 01/27/20 0621 01/28/20 0155 01/29/20 1444  WBC 14.7* 12.7* 11.4* 10.8*  NEUTROABS 12.1* 9.5* 8.0*  --   HGB 14.8 15.3*  14.1 9.9*  HCT 46.0 46.4* 43.7 30.9*  MCV 98.9 96.3 96.9 100.3*  PLT 243 237 220 192   Basic Metabolic Panel: Recent Labs  Lab 01/26/20 1908 01/27/20 0621 01/28/20 0155 01/29/20 1444  NA 137 138 136 139  K 3.9 4.3 4.2 3.7  CL 106 103 106 109  CO2 22 23 21* 20*  GLUCOSE 139* 140* 114* 207*  BUN 14 13 15  26*  CREATININE 0.51 0.61 0.81 0.98  CALCIUM 8.4* 8.6* 8.3* 7.9*  MG  --  2.1  --   --    GFR: Estimated Creatinine Clearance: 35.3 mL/min (by C-G formula based on SCr of 0.98 mg/dL). Liver Function Tests: Recent Labs  Lab 01/27/20 0621  AST 25  ALT 23  ALKPHOS 44  BILITOT 1.1  PROT 6.9  ALBUMIN 4.0   No results for input(s): LIPASE, AMYLASE in the last 168 hours. No results for input(s): AMMONIA in the last 168 hours. Coagulation Profile: Recent Labs  Lab 01/26/20 1908 01/27/20 0621  INR 0.9 0.9   Cardiac Enzymes: No results for input(s): CKTOTAL, CKMB, CKMBINDEX, TROPONINI in the last 168 hours. BNP (last 3 results) No results for input(s): PROBNP in the last 8760 hours. HbA1C: No results for input(s): HGBA1C in the last 72 hours. CBG: No results for input(s): GLUCAP in the last 168 hours. Lipid Profile: No results for input(s): CHOL, HDL, LDLCALC, TRIG, CHOLHDL, LDLDIRECT in the last 72 hours. Thyroid Function Tests: Recent Labs    01/27/20 0621  TSH 1.039   Anemia Panel: No results for input(s): VITAMINB12, FOLATE, FERRITIN, TIBC, IRON, RETICCTPCT in the last 72 hours. Sepsis Labs: No results for input(s): PROCALCITON, LATICACIDVEN in the last 168 hours.  Recent Results (from the past 240 hour(s))  Respiratory Panel by RT PCR (Flu A&B, Covid) - Nasopharyngeal Swab     Status: None   Collection Time: 01/26/20 10:50 PM   Specimen: Nasopharyngeal Swab  Result Value Ref Range Status   SARS Coronavirus 2 by RT PCR NEGATIVE NEGATIVE Final    Comment: (NOTE) SARS-CoV-2 target nucleic acids are NOT DETECTED.  The SARS-CoV-2 RNA is generally  detectable in upper respiratoy specimens during the acute phase of infection. The lowest concentration of SARS-CoV-2 viral copies this assay can detect is 131 copies/mL. A negative result does not preclude SARS-Cov-2 infection and should not be used as the sole basis for treatment or other patient management decisions. A negative result may occur with  improper specimen collection/handling, submission of specimen other than nasopharyngeal swab, presence of viral mutation(s) within the areas targeted by this assay, and inadequate number of viral copies (<131 copies/mL). A negative result must be combined with clinical observations, patient history,  and epidemiological information. The expected result is Negative.  Fact Sheet for Patients:  https://www.moore.com/  Fact Sheet for Healthcare Providers:  https://www.young.biz/  This test is no t yet approved or cleared by the [place] FDA and  has been authorized for detection and/or diagnosis of SARS-CoV-2 by FDA under an Emergency Use Authorization (EUA). This EUA will remain  in effect (meaning this test can be used) for the duration of the COVID-19 declaration under Section 564(b)(1) of the Act, 21 U.S.C. section 360bbb-3(b)(1), unless the authorization is terminated or revoked sooner.     Influenza A by PCR NEGATIVE NEGATIVE Final   Influenza B by PCR NEGATIVE NEGATIVE Final    Comment: (NOTE) The Xpert Xpress SARS-CoV-2/FLU/RSV assay is intended as an aid in  the diagnosis of influenza from Nasopharyngeal swab specimens and  should not be used as a sole basis for treatment. Nasal washings and  aspirates are unacceptable for Xpert Xpress SARS-CoV-2/FLU/RSV  testing.  Fact Sheet for Patients: https://www.moore.com/  Fact Sheet for Healthcare Providers: https://www.young.biz/  This test is not yet approved or cleared by the [place] FDA and    has been authorized for detection and/or diagnosis of SARS-CoV-2 by  FDA under an Emergency Use Authorization (EUA). This EUA will remain  in effect (meaning this test can be used) for the duration of the  Covid-19 declaration under Section 564(b)(1) of the Act, 21  U.S.C. section 360bbb-3(b)(1), unless the authorization is  terminated or revoked. Performed at [facility], [address] [place], [place] [address]   Surgical pcr screen     Status: None   Collection Time: 01/27/20  5:25 PM   Specimen: Nasal Mucosa; Nasal Swab  Result Value Ref Range Status   MRSA, PCR NEGATIVE NEGATIVE Final   Staphylococcus aureus NEGATIVE NEGATIVE Final    Comment: (NOTE) The Xpert SA Assay (FDA approved for NASAL specimens in patients 82 years of age and older), is one component of a comprehensive surveillance program. It is not intended to diagnose infection nor to guide or monitor treatment. Performed at [facility], [address] [place], [place] [address]      Radiology Studies: Pelvis Portable  Result Date: 01/28/2020 CLINICAL DATA:  Status post right hip replacement EXAM: PORTABLE PELVIS 1-2 VIEWS COMPARISON:  Intraoperative films from earlier in the same day. FINDINGS: Right hip prosthesis is now seen in satisfactory position. No acute fracture or dislocation is noted. No soft tissue abnormality is noted. IMPRESSION: Status post right hip replacement. Electronically Signed   By: Alcide Clever M.D.   On: 01/28/2020 11:01   DG C-Arm 1-60 Min  Result Date: 01/28/2020 CLINICAL DATA:  Post total right hip arthroplasty. EXAM: DG C-ARM 1-60 MIN FLUOROSCOPY TIME:  Fluoroscopy Time:  10 seconds. Number of Acquired Spot Images: 0 COMPARISON:  January 26, 2020 FINDINGS: Intraoperative fluoroscopic images demonstrate resection of the right femoral head and neck and placement of total right hip prosthesis, with normal alignment of the prosthetic components. IMPRESSION: Post total right hip  arthroplasty without evidence of immediate complications. Electronically Signed   By: Ted Mcalpine M.D.   On: 01/28/2020 11:03   DG HIP OPERATIVE UNILAT W OR W/O PELVIS RIGHT  Result Date: 01/28/2020 CLINICAL DATA:  Right hip arthroplasty. EXAM: OPERATIVE RIGHT HIP (WITH PELVIS IF PERFORMED) TECHNIQUE: Fluoroscopic spot image(s) were submitted for interpretation post-operatively. COMPARISON:  January 26, 2020 FINDINGS: Post total right hip arthroplasty with resection of the right femoral head and neck. Normal  alignment of the prosthetic components. No evidence of fractures. IMPRESSION: Post total right hip arthroplasty without evidence of immediate complications. Electronically Signed   By: Ted Mcalpineobrinka  Dimitrova M.D.   On: 01/28/2020 11:00    Scheduled Meds: . amLODipine  5 mg Oral Daily  . aspirin EC  81 mg Oral Daily  . brimonidine  1 drop Both Eyes TID  . docusate sodium  100 mg Oral BID  . enoxaparin (LOVENOX) injection  40 mg Subcutaneous Q24H  . pravastatin  40 mg Oral Daily   Continuous Infusions:    LOS: 3 days   Rickey BarbaraStephen Siddhartha Hoback, MD [facility] Pager On Amion  If 7PM-7AM, please contact night-coverage 01/29/2020, 3:54 PM

## 2020-01-29 NOTE — TOC Initial Note (Signed)
Transition of Care [facility]) - Initial/Assessment Note    Patient Details  Name: Tracey Holmes MRN: [id number] Date of Birth: [birth year]  [facility]) CM/SW Contact:    Bethann Berkshire, [place] Phone Number: 01/29/2020, 3:04 PM  Clinical Narrative:                   CSW met with pt and pt's neighbor, Myra Rude, bedside for SNF consult. Pt explains she would not like to go to a SNF and prefers to go to her home. She lives alone in a home in [place] though pt states she will have additional support upon d/c. Pt's neighbor also confirms that pt has multiple supports. Pt's niece will be visiting from [place] and staying with pt. Pt neighbor Margaretha Sheffield also states she will be staying with pt once her niece leaves. Pt states there are various other neighbors that will help with meals and one is building a ramp over the 3 stairs into her home. Pt's niece will bring a rolling walker. Pt also reports that she has a raised cammode and a handicap accessible shower. Pt has no other DME needs. Pt is okay with HH and does not have a preference for [facility] agency.    Expected Discharge Plan: [place] Barriers to Discharge: Continued Medical Work up   Patient Goals and CMS Choice Patient states their goals for this hospitalization and ongoing recovery are:: Home with home health CMS Medicare.gov Compare Post Acute Care list provided to:: Patient    Expected Discharge Plan and Services Expected Discharge Plan: [place] Choice: Durable Medical Equipment, Home Health Living arrangements for the past 2 months: Single Family Home                                      Prior Living Arrangements/Services Living arrangements for the past 2 months: Single Family Home Lives with:: Self Patient language and need for interpreter reviewed:: Yes Do you feel safe going back to the place where you live?: Yes      Need for Family  Participation in Patient Care: Yes (Comment) Care giver support system in place?: Yes (comment) Current home services: DME (States she has a raised cammode and handicap accessible shower) Criminal Activity/Legal Involvement Pertinent to Current Situation/Hospitalization: No - Comment as needed  Activities of Daily Living      Permission Sought/Granted   Permission granted to share information with : Yes, Verbal Permission Granted  Share Information with NAME: gentry,elaine [phone] [facility]) [place], [place]rad[place] Niece [phone]           Emotional Assessment Appearance:: Appears stated age Attitude/Demeanor/Rapport: Engaged, Self-Confident Affect (typically observed): Accepting, Pleasant Orientation: : Oriented to Place, Oriented to  Time, Oriented to Situation, Oriented to Self Alcohol / Substance Use: Not Applicable Psych Involvement: No (comment)  Admission diagnosis:  Fall [W19.XXXA] Closed right hip fracture ([place]) [S72.001A] Closed displaced fracture of right femoral neck ([place]) [S72.001A] Patient Active Problem List   Diagnosis Date Noted  . Closed right hip fracture ([place]) 01/26/2020  . Thoracic aortic aneurysm without rupture ([place]) 02/01/2019  . HTN (hypertension)   . Personal history of kidney stones   . Cystic kidney disease   . Dyslipidemia   . HYPERLIPIDEMIA 11/17/2007  . HYPERTENSION 11/17/2007  . LUNG NODULE 11/17/2007  . RASH AND OTHER  NONSPECIFIC SKIN ERUPTION 11/10/2007   PCP:  Harlan Stains, MD Pharmacy:   [id number] [id number]: [phone]: [phone]    Social Determinants of Health (SDOH) Interventions    Readmission Risk Interventions No flowsheet data found.

## 2020-01-29 NOTE — Plan of Care (Signed)

## 2020-01-29 NOTE — TOC Progression Note (Addendum)
Transition of Care [facility]) - Progression Note    Patient Details  Name: Tracey Holmes MRN: [id number] Date of Birth: 05/31/1933  Transition of Care [facility]) CM/SW Contact  Epifanio Lesches, RN Phone Number: 01/29/2020, 3:52 PM  Clinical Narrative:    NCM spoke with pt @ bedside and confirmed d/c plan, home with home health services. Pt states agreeable to [facility] services. Choice provided. Pt without preference. Referral made with [facility]....acceptance pending.  Pt states niece from [place] will arrive on tomorrow to assist with caring for her once d/c.  TOC team will continue to monitor and follow....  [facility] declined 2/2 staffing [facility] declined 2/2 staffing [facility] acceptance pending .Marland Kitchen..  [facility] declined for [facility] 2/2 limited staff. [facility], referral made via voice message , awaiting call back  1628  Referral made with [facility]..I have been unable to accept  2/2 service area  Expected Discharge Plan: Home w Home Health Services Barriers to Discharge: Continued Medical Work up  Expected Discharge Plan and Services Expected Discharge Plan: Home w Home Health Services     Post Acute Care Choice: Durable Medical Equipment, Home Health Living arrangements for the past 2 months: Single Family Home                           HH Arranged: PT, OT   Date HH Agency Contacted: 01/29/20 Time HH Agency Contacted: 1551 Representative spoke with at [facility] Agency: Kandee Keen   Social Determinants of Health (SDOH) Interventions    Readmission Risk Interventions No flowsheet data found.

## 2020-01-30 ENCOUNTER — Inpatient Hospital Stay (HOSPITAL_COMMUNITY): Payer: Medicare Other

## 2020-01-30 ENCOUNTER — Encounter (HOSPITAL_COMMUNITY): Payer: Self-pay | Admitting: Family Medicine

## 2020-01-30 DIAGNOSIS — R4701 Aphasia: Secondary | ICD-10-CM

## 2020-01-30 DIAGNOSIS — Z419 Encounter for procedure for purposes other than remedying health state, unspecified: Secondary | ICD-10-CM

## 2020-01-30 DIAGNOSIS — I6340 Cerebral infarction due to embolism of unspecified cerebral artery: Secondary | ICD-10-CM

## 2020-01-30 LAB — HEMOGLOBIN A1C
Hgb A1c MFr Bld: 5.6 % (ref 4.8–5.6)
Mean Plasma Glucose: 114.02 mg/dL

## 2020-01-30 LAB — CBC
HCT: 26.9 % — ABNORMAL LOW (ref 36.0–46.0)
Hemoglobin: 8.7 g/dL — ABNORMAL LOW (ref 12.0–15.0)
MCH: 31.6 pg (ref 26.0–34.0)
MCHC: 32.3 g/dL (ref 30.0–36.0)
MCV: 97.8 fL (ref 80.0–100.0)
Platelets: 213 10*3/uL (ref 150–400)
RBC: 2.75 MIL/uL — ABNORMAL LOW (ref 3.87–5.11)
RDW: 12.8 % (ref 11.5–15.5)
WBC: 10.2 10*3/uL (ref 4.0–10.5)
nRBC: 0 % (ref 0.0–0.2)

## 2020-01-30 LAB — BASIC METABOLIC PANEL
Anion gap: 10 (ref 5–15)
BUN: 23 mg/dL (ref 8–23)
CO2: 21 mmol/L — ABNORMAL LOW (ref 22–32)
Calcium: 7.7 mg/dL — ABNORMAL LOW (ref 8.9–10.3)
Chloride: 107 mmol/L (ref 98–111)
Creatinine, Ser: 0.7 mg/dL (ref 0.44–1.00)
GFR calc Af Amer: >60 mL/min (ref >60)
GFR calc non Af Amer: >60 mL/min (ref >60)
Glucose, Bld: 112 mg/dL — ABNORMAL HIGH (ref 70–99)
Potassium: 3.7 mmol/L (ref 3.5–5.1)
Sodium: 138 mmol/L (ref 135–145)

## 2020-01-30 LAB — GLUCOSE, CAPILLARY: Glucose-Capillary: 92 mg/dL (ref 70–99)

## 2020-01-30 MED ORDER — IOHEXOL 350 MG/ML SOLN
75.0000 mL | Freq: Once | INTRAVENOUS | Status: AC | PRN
  Administered 2020-01-30: 75 mL via INTRAVENOUS

## 2020-01-30 MED ORDER — SODIUM CHLORIDE 0.9 % IV SOLN: INTRAVENOUS | Status: DC

## 2020-01-30 NOTE — Progress Notes (Signed)
OT Cancellation Note  Patient Details Name: Tracey Holmes MRN: [id number] DOB: 1933/12/18   Cancelled Treatment:    Reason Eval/Treat Not Completed: Medical issues which prohibited therapy. RN contacting rapid response/code stroke this AM secondary to pt AMS and garbled speech. Will re-attempt OT evaluation when pt medically ready.   Lorre Munroe 01/30/2020, 10:05 AM

## 2020-01-30 NOTE — Code Documentation (Signed)
Inpatient code stroke called by RRT once at bedside. Pt with a right hip surgery on 9/27. LSN this am 0180 talking with NT and eating breakfast. RN came into room and noticed aphasia and dysarthria. Stroke team arrived. Pt NIHSS 8 for inability to answer questions, follow commands, and aphasia. Pt taken to CT and met by neurologist. Pt not a candidate for TPA r/t recent surgery. CT and CTA completed. Per MD not an LVO so not an IR candidate. Pt taken back to 5N and an EKG completed as well as [employer] Screen. Orders for transfer to 3W progressive placed and a bed assigned. Bedside RN will call report. Care Plan: q2 vitals/mNIHSS x12 then q4. SRN completed most recent mNIHSS (unchanged from initial) at 1000. RN will call with any questions.  Kaidyn Javid, Rande Brunt, RN

## 2020-01-30 NOTE — Plan of Care (Signed)

## 2020-01-30 NOTE — Progress Notes (Signed)
Pt ordered her breakfast this morning. Pt was still speaking fluently at 07:55. RN came to room at 08:45, Pt was alert, unable to speak, unable to follow commands, rapid RN and code stroke called, MD notified.   Report given to [place], [place], transferred to [phone].

## 2020-01-30 NOTE — Progress Notes (Signed)
PT Cancellation Note  Patient Details Name: Tracey Holmes MRN: [id number] DOB: July 21, 1933   Cancelled Treatment:    Reason Eval/Treat Not Completed: Medical issues which prohibited therapy RN had just called rapid response/code stroke due to garbled speech and AMS from patient. Holding PT for now. Will resume therapies when medically ready.    Madelaine Etienne, DPT, PN1   Supplemental Physical Therapist [facility]    Pager [phone] Acute Rehab Office [phone]

## 2020-01-30 NOTE — Progress Notes (Signed)
PROGRESS NOTE    Tracey Holmes  GYJ:[id number] DOB: 1933-11-03 DOA: 01/26/2020 PCP: Laurann Montana, MD    Brief Narrative:  86 y.o. female, with history of nephrolithiasis, hypertension, hyperlipidemia, cystic kidney disease, aorta aneurysm, presents to the ED with a chief complaint of fall.  Patient reports that she was walking to her mailbox when all of a sudden she was on the ground.  She reports it happened so fast that she is not sure what happened.  She reports that she did not feel any preceding symptoms like lightheadedness, chest pain, palpitations, or shortness of breath.  Patient reports that she did not hit her head.  She did not lose consciousness.  Patient reports that she fell onto her right side with her right leg pinned underneath her.  She was not able to get herself up, and neighbors came to help.  They were trying to get her to the house but she was unable to weight-bear at all on the right leg so they called 911.  Patient reports that she has no numbness, or tingling in her right leg  Assessment & Plan:   Active Problems:   Closed right hip fracture (HCC)   Aphasia   Acute embolic stroke (HCC)   1. Comminuted impacted subcapital right femoral fracture 1. Ortho consulted and is following 2. Pt now s/p surgery 9/26 3. Therapy recs were for SNF. Discussed with TOC. Family wishes to take home with [facility] services. TOC following 2. Leukocytosis 1. White blood cell count of 14.7 2. Likely stress reaction 3. WBC resolved post-op 4. Hemodynamically stable 3. Hypertension 1. Blood pressure at arrival was 171/95, suspect elevated secondary to pain 2. Continue home medications 3. BP now stable and controlled at this time 4. Hyperlipidemia 1. Continue home medication as pt tolerates 5. Acute CVA 1. New finding this AM 2. Code stroke called this AM with sudden difficulty speaking 3. CT head reviewed, findings suggestive of possible small R middle frontal gyrus cortical  infarct with tiny age indeterminate R cerebellar infarcts. 4. Neurology following. Recommendation for checking lipid profile and a1c. Also f/u on MRI brain and 2d echo 5. Cont on tele 6. PT/OT/SLP  DVT prophylaxis: Lovenox subq Code Status: Full Family Communication: Pt in room, talked to patient's niece over phone  Status is: Inpatient  Remains inpatient appropriate because:Unsafe d/c plan  Dispo: The patient is from: Home              Anticipated d/c is to: Home with [facility]              Anticipated d/c date is: 3 days              Patient currently is not medically stable to d/c.  planning   Consultants:   Orthopedic Surgery  Procedures:   Total R hip arthoplasty 9/26  Antimicrobials: Anti-infectives (From admission, onward)   Start     Dose/Rate Route Frequency Ordered Stop   01/28/20 1400  ceFAZolin (ANCEF) IVPB 2g/100 mL premix        2 g 200 mL/hr over 30 Minutes Intravenous Every 6 hours 01/28/20 1028 01/28/20 2150   01/28/20 0600  ceFAZolin (ANCEF) IVPB 2g/100 mL premix        2 g 200 mL/hr over 30 Minutes Intravenous On call to O.R. 01/27/20 1629 01/28/20 0740      Subjective: Unable to assess as pt with difficulty vocalizing  Objective: Vitals:   01/30/20 0755 01/30/20 0858 01/30/20 0933 01/30/20  1043  BP: (!) 109/52 (!) 142/68 139/69 134/68  Pulse: 90 98  98  Resp: Temp: 99.4 F (37.4 C)  99.5 F (37.5 C) 98.8 F (37.1 C)  TempSrc: Oral  Oral Oral  SpO2: 98% 99% 98% 98%  Weight:      Height:        Intake/Output Summary (Last 24 hours) at 01/30/2020 1445 Last data filed at 01/30/2020 0900 Gross per 24 hour  Intake 120 ml  Output 350 ml  Net -230 ml   Filed Weights   01/26/20 1740 01/29/20 0449  Weight: 51.3 kg 54.4 kg    Examination: General exam: Somewhat conversant with one word answers, in no acute distress Respiratory system: normal chest rise, clear, no audible wheezing Cardiovascular system: regular rhythm,  s1-s2 Gastrointestinal system: Nondistended, nontender, pos BS Central nervous system: No seizures, no tremors, difficulty forming words but follows commands without difficulty or delay Extremities: No cyanosis, no joint deformities Skin: No rashes, no pallor Psychiatry: Affect normal // no auditory hallucinations   Data Reviewed: I have personally reviewed following labs and imaging studies  CBC: Recent Labs  Lab 01/26/20 1908 01/27/20 0621 01/28/20 0155 01/29/20 1444 01/30/20 0437  WBC 14.7* 12.7* 11.4* 10.8* 10.2  NEUTROABS 12.1* 9.5* 8.0*  --   --   HGB 14.8 15.3* 14.1 9.9* 8.7*  HCT 46.0 46.4* 43.7 30.9* 26.9*  MCV 98.9 96.3 96.9 100.3* 97.8  PLT 243 237 220 192 213   Basic Metabolic Panel: Recent Labs  Lab 01/26/20 1908 01/27/20 0621 01/28/20 0155 01/29/20 1444 01/30/20 0437  NA 137 138 136 139 138  K 3.9 4.3 4.2 3.7 3.7  CL 106 103 106 109 107  CO2 22 23 21* 20* 21*  GLUCOSE 139* 140* 114* 207* 112*  BUN 26* 23  CREATININE 0.51 0.61 0.81 0.98 0.70  CALCIUM 8.4* 8.6* 8.3* 7.9* 7.7*  MG  --  2.1  --   --   --    GFR: Estimated Creatinine Clearance: 43.3 mL/min (by C-G formula based on SCr of 0.7 mg/dL). Liver Function Tests: Recent Labs  Lab 01/27/20 0621  AST 25  ALT 23  ALKPHOS 44  BILITOT 1.1  PROT 6.9  ALBUMIN 4.0   No results for input(s): LIPASE, AMYLASE in the last 168 hours. No results for input(s): AMMONIA in the last 168 hours. Coagulation Profile: Recent Labs  Lab 01/26/20 1908 01/27/20 0621  INR 0.9 0.9   Cardiac Enzymes: No results for input(s): CKTOTAL, CKMB, CKMBINDEX, TROPONINI in the last 168 hours. BNP (last 3 results) No results for input(s): PROBNP in the last 8760 hours. HbA1C: No results for input(s): HGBA1C in the last 72 hours. CBG: Recent Labs  Lab 01/30/20 0959  GLUCAP 92   Lipid Profile: No results for input(s): CHOL, HDL, LDLCALC, TRIG, CHOLHDL, LDLDIRECT in the last 72 hours. Thyroid Function  Tests: No results for input(s): TSH, T4TOTAL, FREET4, T3FREE, THYROIDAB in the last 72 hours. Anemia Panel: No results for input(s): VITAMINB12, FOLATE, FERRITIN, TIBC, IRON, RETICCTPCT in the last 72 hours. Sepsis Labs: No results for input(s): PROCALCITON, LATICACIDVEN in the last 168 hours.  Recent Results (from the past 240 hour(s))  Respiratory Panel by RT PCR (Flu A&B, Covid) - Nasopharyngeal Swab     Status: None   Collection Time: 01/26/20 10:50 PM   Specimen: Nasopharyngeal Swab  Result Value Ref Range Status   SARS Coronavirus 2 by RT PCR NEGATIVE NEGATIVE  Final    Comment: (NOTE) SARS-CoV-2 target nucleic acids are NOT DETECTED.  The SARS-CoV-2 RNA is generally detectable in upper respiratoy specimens during the acute phase of infection. The lowest concentration of SARS-CoV-2 viral copies this assay can detect is 131 copies/mL. A negative result does not preclude SARS-Cov-2 infection and should not be used as the sole basis for treatment or other patient management decisions. A negative result may occur with  improper specimen collection/handling, submission of specimen other than nasopharyngeal swab, presence of viral mutation(s) within the areas targeted by this assay, and inadequate number of viral copies (<131 copies/mL). A negative result must be combined with clinical observations, patient history, and epidemiological information. The expected result is Negative.  Fact Sheet for Patients:  https://www.moore.com/  Fact Sheet for Healthcare Providers:  https://www.young.biz/  This test is no t yet approved or cleared by the [place] FDA and  has been authorized for detection and/or diagnosis of SARS-CoV-2 by FDA under an Emergency Use Authorization (EUA). This EUA will remain  in effect (meaning this test can be used) for the duration of the COVID-19 declaration under Section 564(b)(1) of the Act, 21 U.S.C. section  360bbb-3(b)(1), unless the authorization is terminated or revoked sooner.     Influenza A by PCR NEGATIVE NEGATIVE Final   Influenza B by PCR NEGATIVE NEGATIVE Final    Comment: (NOTE) The Xpert Xpress SARS-CoV-2/FLU/RSV assay is intended as an aid in  the diagnosis of influenza from Nasopharyngeal swab specimens and  should not be used as a sole basis for treatment. Nasal washings and  aspirates are unacceptable for Xpert Xpress SARS-CoV-2/FLU/RSV  testing.  Fact Sheet for Patients: https://www.moore.com/  Fact Sheet for Healthcare Providers: https://www.young.biz/  This test is not yet approved or cleared by the [place] FDA and  has been authorized for detection and/or diagnosis of SARS-CoV-2 by  FDA under an Emergency Use Authorization (EUA). This EUA will remain  in effect (meaning this test can be used) for the duration of the  Covid-19 declaration under Section 564(b)(1) of the Act, 21  U.S.C. section 360bbb-3(b)(1), unless the authorization is  terminated or revoked. Performed at [facility], [address] [place], [place] [address]   Surgical pcr screen     Status: None   Collection Time: 01/27/20  5:25 PM   Specimen: Nasal Mucosa; Nasal Swab  Result Value Ref Range Status   MRSA, PCR NEGATIVE NEGATIVE Final   Staphylococcus aureus NEGATIVE NEGATIVE Final    Comment: (NOTE) The Xpert SA Assay (FDA approved for NASAL specimens in patients 31 years of age and older), is one component of a comprehensive surveillance program. It is not intended to diagnose infection nor to guide or monitor treatment. Performed at [facility], [address], [place], [place] [address]      Radiology Studies: CT ANGIO HEAD W OR WO CONTRAST  Result Date: 01/30/2020 CLINICAL DATA:  86 year old female with recent right hip surgery, acute neurologic deficit with aphasia. EXAM: CT ANGIOGRAPHY HEAD AND NECK TECHNIQUE: Multidetector CT  imaging of the head and neck was performed using the standard protocol during bolus administration of intravenous contrast. Multiplanar CT image reconstructions and MIPs were obtained to evaluate the vascular anatomy. Carotid stenosis measurements (when applicable) are obtained utilizing NASCET criteria, using the distal internal carotid diameter as the denominator. CONTRAST:  59mL OMNIPAQUE IOHEXOL 350 MG/ML SOLN COMPARISON:  Plain head CT 0909 hours today FINDINGS: CTA NECK Skeleton: Right greater than left TMJ degeneration.  Exaggerated cervical lordosis. No acute osseous abnormality identified. Upper chest: Negative. Other neck: No acute findings. Aortic arch: Mildly ectatic aortic arch with 3 vessel arch configuration. No arch atherosclerosis. Right carotid system: Tortuous brachiocephalic artery and proximal right CCA. Capacious right carotid bifurcation. Tortuous right ICA without plaque or stenosis. Left carotid system: Normal left CCA origin. Highly tortuous left CCA at the thoracic inlet (series 5, image 127). Capacious left carotid bifurcation. No stenosis. Vertebral arteries: Mild soft and calcified plaque at the right subclavian artery origin without stenosis. Normal right vertebral artery origin. Non dominant right vertebral artery appears patent and normal to the skull base. No proximal left subclavian plaque or stenosis. Normal left vertebral artery origin. Tortuous left V1 segment. Mildly dominant left vertebral artery otherwise patent and normal to the skull base. CTA HEAD Posterior circulation: Mildly dominant left V4 segment. No distal vertebral plaque or stenosis. Normal PICA origins and vertebrobasilar junction. Patent mildly tortuous basilar artery without stenosis. Normal SCA and left PCA origins. Fetal type right PCA origin. Left posterior communicating is diminutive or absent. Bilateral PCA branches are mildly tortuous and otherwise within normal limits. Anterior circulation: Both ICA  siphons are patent with only trace calcified plaque on the left and no stenosis. Ectatic right siphon. Ophthalmic and right posterior communicating artery origins are normal. Patent carotid termini. Normal MCA and ACA origins. Anterior communicating artery is within normal limits. ACA branches are within normal limits. Left MCA M1 segment and bifurcation are patent without stenosis. Left MCA branches are within normal limits. Right MCA M1 segment and bifurcation are patent without stenosis. A somewhat diminutive anterior right M2 branch is noted (series 11, image 18), beyond which there is tortuosity and mild focal fusiform enlargement of the continuing posterior M2 (series 12, image 14). A downstream trifurcation is widely patent without stenosis. And no discrete right MCA branch occlusion is identified. Venous sinuses: Patent. Anatomic variants: Mildly dominant left vertebral artery. Fetal type right PCA origin. Review of the MIP images confirms the above findings IMPRESSION: 1. Negative for large vessel occlusion. 2. Minimal atherosclerosis in the head and neck, but there is generalized arterial tortuosity and ectasia. A mild fusiform enlargement of the dominant posterior Right MCA division in noted, with no discrete intracranial aneurysm. Salient findings reviewed in person with Dr. Marisue Humble on 01/30/2020 at 0938 hours. Electronically Signed   By: Odessa Fleming M.D.   On: 01/30/2020 09:47   CT HEAD CODE STROKE WO CONTRAST  Result Date: 01/30/2020 CLINICAL DATA:  Code stroke. 86 year old female with aphasia. Recent right hip surgery. EXAM: CT HEAD WITHOUT CONTRAST TECHNIQUE: Contiguous axial images were obtained from the base of the skull through the vertex without intravenous contrast. COMPARISON:  None. FINDINGS: Brain: Faint dystrophic calcifications in the bilateral basal ganglia. No acute intracranial hemorrhage identified. No midline shift, mass effect, or evidence of intracranial mass lesion. No  ventriculomegaly. Largely normal gray-white matter differentiation throughout the brain. There is a small area of cortical hypodensity in the right middle frontal gyrus on series 3, image 26. And there is evidence of a tiny age indeterminate right cerebellar infarct on series 3, image 8. No other changes of acute cortically based infarct. Vascular: Mild for age Calcified atherosclerosis at the skull base. No suspicious intracranial vascular hyperdensity. Skull: Severe TMJ degeneration on the right. No acute osseous abnormality identified. Sinuses/Orbits: Visualized paranasal sinuses and mastoids are clear. Other: Postoperative changes to both globes. No acute orbit or scalp soft tissue finding. ASPECTS [facility] Stroke  Program Early CT Score) - Ganglionic level infarction (caudate, lentiform nuclei, internal capsule, insula, M1-M3 cortex): 7 - Supraganglionic infarction (M4-M6 cortex): 2 (right M5). Total score (0-10 with 10 being normal): 9 (on the right). IMPRESSION: 1. No prior study. Possible small right middle frontal gyrus cortical infarct. ASPECTS 9. 2. No intracranial hemorrhage or mass effect. 3. Tiny age indeterminate right cerebellar infarct. 4. These results were communicated to Dr. Thomasena Edis at 9:24 am on 01/30/2020 by text page via the [facility] messaging system. Electronically Signed   By: Odessa Fleming M.D.   On: 01/30/2020 09:26   CT ANGIO NECK CODE STROKE  Result Date: 01/30/2020 CLINICAL DATA:  86 year old female with recent right hip surgery, acute neurologic deficit with aphasia. EXAM: CT ANGIOGRAPHY HEAD AND NECK TECHNIQUE: Multidetector CT imaging of the head and neck was performed using the standard protocol during bolus administration of intravenous contrast. Multiplanar CT image reconstructions and MIPs were obtained to evaluate the vascular anatomy. Carotid stenosis measurements (when applicable) are obtained utilizing NASCET criteria, using the distal internal carotid diameter as the denominator.  CONTRAST:  82mL OMNIPAQUE IOHEXOL 350 MG/ML SOLN COMPARISON:  Plain head CT 0909 hours today FINDINGS: CTA NECK Skeleton: Right greater than left TMJ degeneration. Exaggerated cervical lordosis. No acute osseous abnormality identified. Upper chest: Negative. Other neck: No acute findings. Aortic arch: Mildly ectatic aortic arch with 3 vessel arch configuration. No arch atherosclerosis. Right carotid system: Tortuous brachiocephalic artery and proximal right CCA. Capacious right carotid bifurcation. Tortuous right ICA without plaque or stenosis. Left carotid system: Normal left CCA origin. Highly tortuous left CCA at the thoracic inlet (series 5, image 127). Capacious left carotid bifurcation. No stenosis. Vertebral arteries: Mild soft and calcified plaque at the right subclavian artery origin without stenosis. Normal right vertebral artery origin. Non dominant right vertebral artery appears patent and normal to the skull base. No proximal left subclavian plaque or stenosis. Normal left vertebral artery origin. Tortuous left V1 segment. Mildly dominant left vertebral artery otherwise patent and normal to the skull base. CTA HEAD Posterior circulation: Mildly dominant left V4 segment. No distal vertebral plaque or stenosis. Normal PICA origins and vertebrobasilar junction. Patent mildly tortuous basilar artery without stenosis. Normal SCA and left PCA origins. Fetal type right PCA origin. Left posterior communicating is diminutive or absent. Bilateral PCA branches are mildly tortuous and otherwise within normal limits. Anterior circulation: Both ICA siphons are patent with only trace calcified plaque on the left and no stenosis. Ectatic right siphon. Ophthalmic and right posterior communicating artery origins are normal. Patent carotid termini. Normal MCA and ACA origins. Anterior communicating artery is within normal limits. ACA branches are within normal limits. Left MCA M1 segment and bifurcation are patent without  stenosis. Left MCA branches are within normal limits. Right MCA M1 segment and bifurcation are patent without stenosis. A somewhat diminutive anterior right M2 branch is noted (series 11, image 18), beyond which there is tortuosity and mild focal fusiform enlargement of the continuing posterior M2 (series 12, image 14). A downstream trifurcation is widely patent without stenosis. And no discrete right MCA branch occlusion is identified. Venous sinuses: Patent. Anatomic variants: Mildly dominant left vertebral artery. Fetal type right PCA origin. Review of the MIP images confirms the above findings IMPRESSION: 1. Negative for large vessel occlusion. 2. Minimal atherosclerosis in the head and neck, but there is generalized arterial tortuosity and ectasia. A mild fusiform enlargement of the dominant posterior Right MCA division in noted, with no discrete intracranial aneurysm.  Salient findings reviewed in person with Dr. Marisue HumbleHUNTER COLLINS on 01/30/2020 at 0938 hours. Electronically Signed   By: Odessa FlemingH  Hall M.D.   On: 01/30/2020 09:47    Scheduled Meds: . aspirin EC  81 mg Oral Daily  . brimonidine  1 drop Both Eyes TID  . docusate sodium  100 mg Oral BID  . enoxaparin (LOVENOX) injection  40 mg Subcutaneous Q24H  . pravastatin  40 mg Oral Daily   Continuous Infusions:    LOS: 4 days   Rickey BarbaraStephen Jessamy Torosyan, MD [facility] Pager On Amion  If 7PM-7AM, please contact night-coverage 01/30/2020, 2:45 PM

## 2020-01-30 NOTE — H&P (Signed)
Neurology H&P  CC: acute onset aphasia  History is obtained from: Nursing staff and chart review.  HPI: Tracey Holmes is a 86 y.o. female admitted after fall for right femoral fracture s/p total hip replacement noted to have acute onset aphasia. Per nursing staff, the patient was completely intact this morning and ordered breakfast. ~0930 nursing staff entered the room and noted the patient was alert but only able to speak "gibberish" not making sense and code stroke was called.   LKW: 4503 tpa given?: No, Nursing staff the patient had major hip surgery yesterday. IR Thrombectomy? No Modified Rankin Scale: 0-Completely asymptomatic and back to baseline post- stroke - Patient lives alone. NIHSS: 8 - global aphasia.  ROS: Unable to global aphasia.   Past Medical History:  Diagnosis Date  . Aneurysm, descending thoracic aorta    Proximal (1.5 cm)  . Cystic kidney disease   . Dyslipidemia   . HTN (hypertension)   . Personal history of kidney stones    No family history on file.  Social History:  reports that she has never smoked. She has never used smokeless tobacco. She reports that she does not drink alcohol and does not use drugs.   Prior to Admission medications   Medication Sig Start Date End Date Taking? Authorizing Provider  amLODipine (NORVASC) 5 MG tablet Take 5 mg by mouth daily.  12/29/10  Yes [provider]  aspirin 81 MG tablet Take 81 mg by mouth daily.     Yes [provider]  brimonidine (ALPHAGAN) 0.2 % ophthalmic solution Place 1 drop into both eyes 3 (three) times daily. 10/26/19  Yes [provider]  Cholecalciferol (VITAMIN D) 2000 UNITS tablet Take 4,000 Units by mouth daily.   Yes [provider]  denosumab (PROLIA) 60 MG/ML SOLN injection Inject 60 mg into the skin every 6 (six) months. Administer in upper arm, thigh, or abdomen   Yes [provider]  dorzolamide-timolol (COSOPT) 22.3-6.8 MG/ML ophthalmic  solution Place 1 drop into both eyes 2 (two) times daily.  04/22/15  Yes [provider]  Multiple Vitamin (MULTIVITAMIN WITH MINERALS) TABS tablet Take 1 tablet by mouth daily.   Yes [provider]  pravastatin (PRAVACHOL) 80 MG tablet Take 1 tablet by mouth every evening. 12/28/19  Yes [provider]  ROCKLATAN 0.02-0.005 % SOLN Place 1 drop into both eyes at bedtime.  01/10/20  Yes [provider]  aspirin (ASPIRIN CHILDRENS) 81 MG chewable tablet Chew 1 tablet (81 mg total) by mouth 2 (two) times daily with a meal. 01/29/20 03/14/20  Barrie Dunker B, PA  HYDROcodone-acetaminophen (NORCO/VICODIN) 5-325 MG tablet Take 1-2 tablets by mouth every 4 (four) hours as needed for up to 7 days for moderate pain (pain score 4-6). 01/29/20 02/05/20  Darrick Grinder, PA   Exam: Current vital signs: BP 139/69 (BP Location: Left Arm)   Pulse 98   Temp 99.5 F (37.5 C) (Oral)   Resp 16   Ht 5\' 3"  (1.6 m)   Wt 54.4 kg   SpO2 98%   BMI 21.26 kg/m    Physical Exam  Constitutional: Appears well-developed and well-nourished.  Psych: Affect appropriate to situation Eyes: No scleral injection HENT: No OP obstrucion Head: Normocephalic.  Cardiovascular: Normal rate and regular rhythm.  Respiratory: Effort normal and breath sounds normal to anterior ascultation GI: Soft.  No distension. There is no tenderness.  Skin: WDI  Neuro: Mental Status: Patient is awake but unable to answer  questions due to aphasia  Cranial Nerves: II: Visual Fields are full. Pupils are equal, round, and reactive to light.   III,IV, VI: EOMI without ptosis or diploplia.  V: Facial sensation is symmetric to temperature VII: Facial movement is symmetric.  VIII: hearing is intact to voice X: Patient was not able to follow commands consistently. XII: tongue is midline without atrophy or fasciculations.  Motor: Tone is normal. Bulk is normal. Moved all extremities consistently in all four  extremities.  Sensory: Sensation is symmetric to light touch and temperature in the arms and legs. Deep Tendon Reflexes: 2+ and symmetric in the biceps and patellae.  Plantars: Toes are downgoing bilaterally. Cerebellar: Unable to test due to inability to follow commands.  I have reviewed the images obtained: NCT head showed hypodensity in right frontal gyrus suggesting age indeterminate stroke. CTA head and neck did not show large vessel occlusion, but did show mild fusiform enlargement of the dominant posterior Right MCA.  Primary Diagnosis:  Cerebral infarction, unspecified.  Impression: 87 year old woman right femoral fracture s/p total hip replacement with acute global aphasia and NCT head showing right middle frontal gyrus stroke. Her handedness is not clear. The patient just underwent a major surgical procedure and was not a candidate for tPA and there was no LVO for intervention. The CTA head and neck did not show significant cerebrovascular disease suggests possible embolus. The patient will need stroke workup to evaluate for possible cardiac etiology as well.  Plan: - Brain MRI  - Recommend TTE - Recommend ordering Lipid panel - Recommend Statin if LDL > 70 - Recommend obtaining HbA1c - Aspirin 81mg  daily - SBP goal - permissive hypertension first 24 h < 220/110. Hold home meds.  - Telemetry monitoring for arrythmia - Recommend bedside Swallow screen - Recommend Stroke education - Recommend PT/OT/SLP consult  This patient is critically ill and at significant risk of neurological worsening, death and care requires constant monitoring of vital signs, hemodynamics,respiratory and cardiac monitoring, neurological assessment, discussion with family, other specialists and medical decision making of high complexity. I spent 75 minutes of neurocritical care time  in the care of  this patient. This was time spent independent of any time provided by nurse practitioner or  PA.  Electronically signed by: Dr. Pager: [phone]

## 2020-01-30 NOTE — Significant Event (Signed)
Rapid Response Event Note   Reason for Call :  Per staff she was speaking fluently this am at 0755,  She was able to order her breakfast and have conversation with the staff.  Initial Focused Assessment:  Upon my arrival she is alert, aphasic and dysarthric. She is able to speak some words but not appropriate words and can not follow commands BP 142/68 SR 98  RR 16  O2 sat 98% on RA NIHSS 8 Dr Thomasena Edis at bedside to assess patient Spoke with Dr Rhona Leavens via phone  Interventions:  Positioned flat in the bed Stat head CT Stat CTA head and neck done [facility] swallow screen done 12 lead EKG done CBG 92  She is now able to follow a few commands but still aphasic  Plan of Care:  Transfer to neuro progressive care  Event Summary:   MD Notified: Dr Rhona Leavens & Dr Thomasena Edis Call Time: [phone] Arrival Time: 0900 End Time: 1015  Marcellina Millin, RN

## 2020-01-30 NOTE — Evaluation (Signed)
Clinical/Bedside Swallow Evaluation Patient Details  Name: Tracey Holmes MRN: [id number] Date of Birth: Jul 08, 1933  Today's Date: 01/30/2020 Time: SLP Start Time (ACUTE ONLY): 1202 SLP Stop Time (ACUTE ONLY): 1218 SLP Time Calculation (min) (ACUTE ONLY): 16 min  Past Medical History:  Past Medical History:  Diagnosis Date  . Aneurysm, descending thoracic aorta    Proximal (1.5 cm)  . Cystic kidney disease   . Dyslipidemia   . HTN (hypertension)   . Personal history of kidney stones    Past Surgical History:  Past Surgical History:  Procedure Laterality Date  . TOTAL HIP ARTHROPLASTY Right 01/28/2020   Procedure: TOTAL HIP ARTHROPLASTY ANTERIOR APPROACH;  Surgeon: Samson Frederic, MD;  Location: [facility] OR;  Service: Orthopedics;  Laterality: Right;   HPI:  86yo female who reports a fall at home, found to have comminuted impacted subcapital R femoral fracture. Received R anterior approach THA 01/28/20.  LSN on 9/28, 0755 talking with NT and eating breakfast. RN came into room later and noticed aphasia and dysarthria. Stroke team arrived. Pt NIHSS 8 for inability to answer questions, follow commands, and aphasia. Pt taken to CT, shows Possible small right middle frontal gyrus cortical infarct. Pt passed a [employer].    Assessment / Plan / Recommendation Clinical Impression  Pt was seen for BSE following recent stroke after hip replacement. She demonstrates with significant cognitive/language impairment and required cueing throughout. Pt tolerated all POs without overt s/sx of aspiration but demonstrated a prolonged oral transit and suspected delayed swallow. Her swallow became increasingly delayed with puree and solids, requiring verbal cues to swallow each PO before taking another. Given suspected difficulty initiating swallow, recommend dys 2 (fine chop) diet and thin liquid.  Pt will require full supervision/cueing for compensatory strategies during meal. Recommend use of slow  rate and monitor that pt swallows each PO before taking another. SLP will f/u acutely for diet toleration and advancement.  SLP Visit Diagnosis: Dysphagia, unspecified (R13.10)    Aspiration Risk  Mild aspiration risk    Diet Recommendation Thin liquid;Dysphagia 2 (Fine chop)   Liquid Administration via: Straw;Cup Medication Administration: Whole meds with liquid Supervision: Full supervision/cueing for compensatory strategies Compensations: Small sips/bites;Slow rate Postural Changes: Seated upright at 90 degrees    Other  Recommendations Oral Care Recommendations: Oral care BID   Follow up Recommendations        Frequency and Duration min 2x/week  2 weeks       Prognosis Prognosis for Safe Diet Advancement: Good Barriers to Reach Goals: Cognitive deficits      Swallow Study   General Date of Onset: 01/30/20 HPI: 86yo female who reports a fall at home, found to have comminuted impacted subcapital R femoral fracture. Received R anterior approach THA 01/28/20.  LSN on 9/28, 0755 talking with NT and eating breakfast. RN came into room later and noticed aphasia and dysarthria. Stroke team arrived. Pt NIHSS 8 for inability to answer questions, follow commands, and aphasia. Pt taken to CT, shows Possible small right middle frontal gyrus cortical infarct. Pt passed a [employer].  Type of Study: Bedside Swallow Evaluation Diet Prior to this Study: Regular;Thin liquids Temperature Spikes Noted: No Respiratory Status: Room air History of Recent Intubation: No Behavior/Cognition: Pleasant mood;Confused;Requires cueing;Distractible Oral Cavity Assessment: Within Functional Limits Oral Care Completed by SLP: No Oral Cavity - Dentition: Adequate natural dentition Vision: Functional for self-feeding Self-Feeding Abilities: Able to feed self Patient Positioning: Upright in bed Baseline Vocal Quality: Normal  Oral/Motor/Sensory Function Overall Oral Motor/Sensory Function:  Within functional limits   Ice Chips Ice chips: Impaired Presentation: Spoon Oral Phase Functional Implications: Prolonged oral transit Pharyngeal Phase Impairments: Suspected delayed Swallow;Multiple swallows   Thin Liquid Thin Liquid: Within functional limits    Nectar Thick Nectar Thick Liquid: Not tested   Honey Thick Honey Thick Liquid: Not tested   Puree Puree: Impaired Presentation: Self Fed;Spoon Oral Phase Functional Implications: Prolonged oral transit Pharyngeal Phase Impairments: Suspected delayed Swallow   Solid     Solid: Impaired Presentation: Self Fed Oral Phase Functional Implications: Prolonged oral transit Pharyngeal Phase Impairments: Suspected delayed Swallow      Royetta Crochet 01/30/2020,1:10 PM

## 2020-01-30 NOTE — Evaluation (Signed)
Speech Language Pathology Evaluation Patient Details Name: Tracey Holmes MRN: [id number] DOB: Jul 24, 1933 Today's Date: 01/30/2020 Time: 4174-0814 SLP Time Calculation (min) (ACUTE ONLY): 24 min  Problem List:  Patient Active Problem List   Diagnosis Date Noted  . Aphasia   . Acute embolic stroke (HCC)   . Closed right hip fracture (HCC) 01/26/2020  . Thoracic aortic aneurysm without rupture (HCC) 02/01/2019  . HTN (hypertension)   . Personal history of kidney stones   . Cystic kidney disease   . Dyslipidemia   . HYPERLIPIDEMIA 11/17/2007  . HYPERTENSION 11/17/2007  . LUNG NODULE 11/17/2007  . RASH AND OTHER NONSPECIFIC SKIN ERUPTION 11/10/2007   Past Medical History:  Past Medical History:  Diagnosis Date  . Aneurysm, descending thoracic aorta    Proximal (1.5 cm)  . Cystic kidney disease   . Dyslipidemia   . HTN (hypertension)   . Personal history of kidney stones    Past Surgical History:  Past Surgical History:  Procedure Laterality Date  . TOTAL HIP ARTHROPLASTY Right 01/28/2020   Procedure: TOTAL HIP ARTHROPLASTY ANTERIOR APPROACH;  Surgeon: Samson Frederic, MD;  Location: [facility] OR;  Service: Orthopedics;  Laterality: Right;   HPI:  86yo female who reports a fall at home, found to have comminuted impacted subcapital R femoral fracture. Received R anterior approach THA 01/28/20.  LSN on 9/28, 0755 talking with NT and eating breakfast. RN came into room later and noticed aphasia and dysarthria. Stroke team arrived. Pt NIHSS 8 for inability to answer questions, follow commands, and aphasia. Pt taken to CT, shows Possible small right middle frontal gyrus cortical infarct. Pt passed a [employer].    Assessment / Plan / Recommendation Clinical Impression  Pt seen for SLE and presents with severe global aphasia, characterized by deficits in verbal expression (including spontaneous speech, repetition, and naming) and auditory comprehension. Her spontaneous speech  contains very little meaningful output and is characterized by perseverations of single words and fillers (no, alright, um). Her repetition is also impaired, but given max verbal and visual cues, she successfully repeated at the word and phrase level. SLP facilitated automatic speech with the pt and she successfully counted to 10 and recited the days of the week with fading verbal and visual cues. Her naming abilities are also impaired, and she demonstrated difficulty attending to the naming task. Her auditory comprehension of yes/no questions was impaired as evidenced by a score of 3/10 for the corresponding section on the WAB Bedside. She also demonstrated difficutly following basic commands and often required visual cues. Her language deficits left her unable to respond to basic orientation questions (self, place, and situation). SLP will f/u acutely to address aphasia.     SLP Assessment  SLP Recommendation/Assessment: Patient needs continued Speech Lanaguage Pathology Services SLP Visit Diagnosis: Aphasia (R47.01)    Follow Up Recommendations       Frequency and Duration min 2x/week  2 weeks      SLP Evaluation Cognition  Overall Cognitive Status: Impaired/Different from baseline Arousal/Alertness: Awake/alert Orientation Level: Disoriented to person;Disoriented to place Attention: Focused;Sustained Focused Attention: Impaired Focused Attention Impairment: Verbal basic Sustained Attention: Impaired Sustained Attention Impairment: Verbal basic Memory: Impaired (Could not assess due to severity of language deficits) Awareness: Impaired Awareness Impairment: Intellectual impairment;Emergent impairment Problem Solving: Impaired Problem Solving Impairment: Verbal basic Safety/Judgment: Impaired       Comprehension  Auditory Comprehension Overall Auditory Comprehension: Impaired Yes/No Questions: Impaired Basic Biographical Questions: 0-25% accurate Basic Immediate Environment  Questions: 25-49% accurate Other Yes/No Questions Comments`: Minimal Commands: Impaired One Step Basic Commands: 25-49% accurate Visual Recognition/Discrimination Discrimination: Not tested Reading Comprehension Reading Status: Not tested    Expression Expression Primary Mode of Expression: Verbal Verbal Expression Overall Verbal Expression: Impaired Initiation: Impaired Automatic Speech: Social Response;Counting;Day of week Level of Generative/Spontaneous Verbalization: Word Repetition: Impaired Level of Impairment: Word level;Phrase level;Sentence level Naming: Impairment Responsive: Not tested Confrontation: Impaired Convergent: 0-24% accurate Divergent: Not tested Verbal Errors: Perseveration;Not aware of errors Pragmatics: Impairment Impairments: Abnormal affect   Oral / Motor  Oral Motor/Sensory Function Overall Oral Motor/Sensory Function: Within functional limits Motor Speech Overall Motor Speech: Appears within functional limits for tasks assessed Respiration: Within functional limits Phonation: Normal Resonance: Within functional limits Articulation: Within functional limitis Intelligibility: Intelligible Motor Planning: Witnin functional limits Motor Speech Errors: Not applicable   GO                    Royetta Crochet 01/30/2020, 1:42 PM

## 2020-01-31 ENCOUNTER — Inpatient Hospital Stay (HOSPITAL_COMMUNITY): Payer: Medicare Other

## 2020-01-31 DIAGNOSIS — I639 Cerebral infarction, unspecified: Secondary | ICD-10-CM

## 2020-01-31 DIAGNOSIS — I6389 Other cerebral infarction: Secondary | ICD-10-CM

## 2020-01-31 LAB — CBC
HCT: 28.7 % — ABNORMAL LOW (ref 36.0–46.0)
Hemoglobin: 9.2 g/dL — ABNORMAL LOW (ref 12.0–15.0)
MCH: 32.4 pg (ref 26.0–34.0)
MCHC: 32.1 g/dL (ref 30.0–36.0)
MCV: 101.1 fL — ABNORMAL HIGH (ref 80.0–100.0)
Platelets: 223 10*3/uL (ref 150–400)
RBC: 2.84 MIL/uL — ABNORMAL LOW (ref 3.87–5.11)
RDW: 12.8 % (ref 11.5–15.5)
WBC: 9.9 10*3/uL (ref 4.0–10.5)
nRBC: 0 % (ref 0.0–0.2)

## 2020-01-31 LAB — LIPID PANEL
Cholesterol: 135 mg/dL (ref 0–200)
HDL: 45 mg/dL (ref >40)
LDL Cholesterol: 73 mg/dL (ref 0–99)
Total CHOL/HDL Ratio: 3 RATIO
Triglycerides: 84 mg/dL (ref <150)
VLDL: 17 mg/dL (ref 0–40)

## 2020-01-31 LAB — COMPREHENSIVE METABOLIC PANEL
ALT: 24 U/L (ref 0–44)
AST: 32 U/L (ref 15–41)
Albumin: 2.7 g/dL — ABNORMAL LOW (ref 3.5–5.0)
Alkaline Phosphatase: 52 U/L (ref 38–126)
Anion gap: 12 (ref 5–15)
BUN: 17 mg/dL (ref 8–23)
CO2: 17 mmol/L — ABNORMAL LOW (ref 22–32)
Calcium: 7.8 mg/dL — ABNORMAL LOW (ref 8.9–10.3)
Chloride: 110 mmol/L (ref 98–111)
Creatinine, Ser: 0.56 mg/dL (ref 0.44–1.00)
GFR calc Af Amer: >60 mL/min (ref >60)
GFR calc non Af Amer: >60 mL/min (ref >60)
Glucose, Bld: 119 mg/dL — ABNORMAL HIGH (ref 70–99)
Potassium: 3.7 mmol/L (ref 3.5–5.1)
Sodium: 139 mmol/L (ref 135–145)
Total Bilirubin: 1.2 mg/dL (ref 0.3–1.2)
Total Protein: 5.2 g/dL — ABNORMAL LOW (ref 6.5–8.1)

## 2020-01-31 LAB — ECHOCARDIOGRAM COMPLETE
Area-P 1/2: 3.31 cm2
Calc EF: 63.8 %
Height: 63 in
S' Lateral: 2.1 cm
Single Plane A2C EF: 66.3 %
Single Plane A4C EF: 59.1 %
Weight: 1943.58 oz

## 2020-01-31 NOTE — Progress Notes (Signed)
SLP Cancellation Note  Patient Details Name: Tracey Holmes MRN: [id number] DOB: 1934-04-28   Cancelled treatment:       Reason Eval/Treat Not Completed: Patient at procedure or test/unavailable   Alissia Lory, Riley Nearing 01/31/2020, 9:40 AM

## 2020-01-31 NOTE — Progress Notes (Signed)
STROKE TEAM PROGRESS NOTE   INTERVAL HISTORY Her niece Fannie KneeSue is at the bedside.  I have personally reviewed history of presenting illness with the patient, electronic medical records and imaging films and PACS.  She presented with aphasia following recent hip surgery and MRI scan shows bicerebral embolic left greater than Right infarcts.  Echocardiogram is pending.  CT angiogram shows no large vessel stenosis or occlusion. Vitals:   01/31/20 0012 01/31/20 0340 01/31/20 0720 01/31/20 1108  BP: 131/63 (!) 126/55 132/72 (!) 157/75  Pulse: 97 87 98 95  Resp: 17 16 18 18   Temp: 98.2 F (36.8 C) 98.2 F (36.8 C) 99.2 F (37.3 C) 98.7 F (37.1 C)  TempSrc: Oral  Oral Oral  SpO2: 97% 95% 99% 100%  Weight:  55.1 kg    Height:       CBC:  Recent Labs  Lab 01/27/20 0621 01/27/20 0621 01/28/20 0155 01/29/20 1444 01/30/20 0437 01/31/20 0656  WBC 12.7*   < > 11.4*   < > 10.2 9.9  NEUTROABS 9.5*  --  8.0*  --   --   --   HGB 15.3*   < > 14.1   < > 8.7* 9.2*  HCT 46.4*   < > 43.7   < > 26.9* 28.7*  MCV 96.3   < > 96.9   < > 97.8 101.1*  PLT 237   < > 220   < > 213 223   < > = values in this interval not displayed.   Basic Metabolic Panel:  Recent Labs  Lab 01/27/20 0621 01/28/20 0155 01/30/20 0437 01/31/20 0656  NA 138   < > 138 139  K 4.3   < > 3.7 3.7  CL 103   < > 107 110  CO2 23   < > 21* 17*  GLUCOSE 140*   < > 112* 119*  BUN 13   < > 23 17  CREATININE 0.61   < > 0.70 0.56  CALCIUM 8.6*   < > 7.7* 7.8*  MG 2.1  --   --   --    < > = values in this interval not displayed.   Lipid Panel:  Recent Labs  Lab 01/31/20 0656  CHOL 135  TRIG 84  HDL 45  CHOLHDL 3.0  VLDL 17  LDLCALC 73   HgbA1c:  Recent Labs  Lab 01/30/20 1432  HGBA1C 5.6   Urine Drug Screen: No results for input(s): LABOPIA, COCAINSCRNUR, LABBENZ, AMPHETMU, THCU, LABBARB in the last 168 hours.  Alcohol Level No results for input(s): ETH in the last 168 hours.  IMAGING past 24 hours DG Abd 1  View  Result Date: 01/30/2020 CLINICAL DATA:  Foreign body EXAM: ABDOMEN - 1 VIEW COMPARISON:  None. FINDINGS: Contrast material in the urinary bladder. Nonobstructed bowel-gas pattern. No radiopaque foreign body over the abdomen. Status post right hip replacement. IMPRESSION: Nonobstructed bowel-gas pattern. Electronically Signed   By: Jasmine PangKim  Fujinaga M.D.   On: 01/30/2020 20:07   MR BRAIN WO CONTRAST  Result Date: 01/30/2020 CLINICAL DATA:  Stroke follow-up EXAM: MRI HEAD WITHOUT CONTRAST TECHNIQUE: Multiplanar, multiecho pulse sequences of the brain and surrounding structures were obtained without intravenous contrast. COMPARISON:  Head CT 01/30/2020 FINDINGS: Brain: Multifocal abnormal diffusion restriction within both hemispheres. The largest area is in the left frontal operculum. This is in a location in keeping with aphasia. The largest contralateral abnormality is at the middle frontal gyrus. There is petechial hemorrhage within both frontal  opercula. Multifocal hyperintense T2-weighted signal within the white matter. There is generalized atrophy without lobar predilection. Old small vessel infarct of the right cerebellum. No chronic microhemorrhage. Normal midline structures. Vascular: Normal flow voids Skull and upper cervical spine: Normal Sinuses/Orbits: Bilateral lens replacements. Paranasal sinuses are clear. No mastoid effusion. Normal nasopharynx. Other: None IMPRESSION: 1. Multifocal acute ischemia within both hemispheres, left worse than right. The largest infarct, involving the left frontal operculum, is in keeping with reported aphasia. 2. Petechial hemorrhage within both frontal opercula. No mass effect. (Heidelberg classification 1b: HI2, confluent petechiae, no mass effect.) Electronically Signed   By: Deatra Robinson M.D.   On: 01/30/2020 19:03   ECHOCARDIOGRAM COMPLETE  Result Date: 01/31/2020    ECHOCARDIOGRAM REPORT   Patient Name:   BAMBI FEHNEL Date of Exam: 01/31/2020 Medical  Rec #:  [id number]         Height:       63.0 in Accession #:    [id number]        Weight:       121.5 lb Date of Birth:  05/15/1933         BSA:          1.564 m Patient Age:    86 years          BP:           132/72 mmHg Patient Gender: F                 HR:           99 bpm. Exam Location:  Inpatient Procedure: 2D Echo, Cardiac Doppler and Color Doppler Indications:    Stroke  History:        Patient has no prior history of Echocardiogram examinations.                 Risk Factors:Hypertension and Dyslipidemia. Thoracic aortic                 aneurysm.  Sonographer:    Sheralyn Boatman RDCS Referring Phys: [id number] STEPHEN K CHIU  Sonographer Comments: Technically difficult study due to poor echo windows. IMPRESSIONS  1. There is acceleration of flow through left ventricular outflow tract. Non obstructive. Left ventricular ejection fraction, by estimation, is 65 to 70%. The left ventricle has normal function. The left ventricle has no regional wall motion abnormalities. Left ventricular diastolic parameters are consistent with Grade I diastolic dysfunction (impaired relaxation).  2. Right ventricular systolic function is normal. The right ventricular size is normal.  3. The mitral valve is normal in structure. No evidence of mitral valve regurgitation. No evidence of mitral stenosis.  4. The aortic valve is normal in structure. Aortic valve regurgitation is not visualized. No aortic stenosis is present.  5. Aortic dilatation noted. There is mild dilatation of the ascending aorta, measuring 40 mm.  6. The inferior vena cava is normal in size with greater than 50% respiratory variability, suggesting right atrial pressure of 3 mmHg. Conclusion(s)/Recommendation(s): No intracardiac source of embolism detected on this transthoracic study. A transesophageal echocardiogram is recommended to exclude cardiac source of embolism if clinically indicated. FINDINGS  Left Ventricle: There is acceleration of flow through left ventricular  outflow tract. Non obstructive. Left ventricular ejection fraction, by estimation, is 65 to 70%. The left ventricle has normal function. The left ventricle has no regional wall motion abnormalities. The left ventricular internal cavity size was normal in size. There is no left ventricular hypertrophy. Left ventricular  diastolic parameters are consistent with Grade I diastolic dysfunction (impaired relaxation). Right Ventricle: The right ventricular size is normal. No increase in right ventricular wall thickness. Right ventricular systolic function is normal. Left Atrium: Left atrial size was normal in size. Right Atrium: Right atrial size was normal in size. Pericardium: There is no evidence of pericardial effusion. Mitral Valve: The mitral valve is normal in structure. No evidence of mitral valve regurgitation. No evidence of mitral valve stenosis. MV peak gradient, 14.4 mmHg. The mean mitral valve gradient is 3.0 mmHg. Tricuspid Valve: The tricuspid valve is normal in structure. Tricuspid valve regurgitation is trivial. No evidence of tricuspid stenosis. Aortic Valve: The aortic valve is normal in structure. Aortic valve regurgitation is not visualized. No aortic stenosis is present. Pulmonic Valve: The pulmonic valve was normal in structure. Pulmonic valve regurgitation is not visualized. No evidence of pulmonic stenosis. Aorta: Aortic dilatation noted. There is mild dilatation of the ascending aorta, measuring 40 mm. Venous: The inferior vena cava is normal in size with greater than 50% respiratory variability, suggesting right atrial pressure of 3 mmHg. IAS/Shunts: No atrial level shunt detected by color flow Doppler.  LEFT VENTRICLE PLAX 2D LVIDd:         2.90 cm     Diastology LVIDs:         2.10 cm     LV e' medial:    9.06 cm/s LV PW:         1.10 cm     LV E/e' medial:  10.8 LV IVS:        1.10 cm     LV e' lateral:   10.90 cm/s LVOT diam:     1.80 cm     LV E/e' lateral: 9.0 LV SV:         56 LV SV Index:    36 LVOT Area:     2.54 cm  LV Volumes (MOD) LV vol d, MOD A2C: 51.0 ml LV vol d, MOD A4C: 40.2 ml LV vol s, MOD A2C: 17.2 ml LV vol s, MOD A4C: 16.5 ml LV SV MOD A2C:     33.8 ml LV SV MOD A4C:     40.2 ml LV SV MOD BP:      30.0 ml RIGHT VENTRICLE             IVC RV S prime:     13.20 cm/s  IVC diam: 1.60 cm TAPSE (M-mode): 1.9 cm LEFT ATRIUM             Index       RIGHT ATRIUM          Index LA diam:        2.30 cm 1.47 cm/m  RA Area:     8.57 cm LA Vol (A2C):   18.2 ml 11.63 ml/m RA Volume:   15.70 ml 10.04 ml/m LA Vol (A4C):   23.8 ml 15.21 ml/m LA Biplane Vol: 21.8 ml 13.94 ml/m  AORTIC VALVE LVOT Vmax:   137.00 cm/s LVOT Vmean:  83.900 cm/s LVOT VTI:    0.221 m  AORTA Ao Root diam: 2.80 cm Ao Asc diam:  4.05 cm Ao Arch diam: 4.0 cm MITRAL VALVE MV Area (PHT): 3.31 cm     SHUNTS MV Peak grad:  14.4 mmHg    Systemic VTI:  0.22 m MV Mean grad:  3.0 mmHg     Systemic Diam: 1.80 cm MV Vmax:       1.90 m/s  MV Vmean:      73.2 cm/s MV Decel Time: 229 msec MV E velocity: 98.10 cm/s MV A velocity: 157.00 cm/s MV E/A ratio:  0.62 Donato Schultz MD Electronically signed by Donato Schultz MD Signature Date/Time: 01/31/2020/10:40:23 AM    Final     PHYSICAL EXAM Pleasant elderly Caucasian lady not in distress. . Afebrile. Head is nontraumatic. Neck is supple without bruit.    Cardiac exam no murmur or gallop. Lungs are clear to auscultation. Distal pulses are well felt. Neurological Exam ;  Awake  Alert globally aphasic with nonfluent speech and l word finding difficulties.  Can comprehend simple midline and one-step commands only.[place] movements full without nystagmus.fundi were not visualized. Vision acuity and fields appear normal. Hearing is normal. Palatal movements are normal. Face symmetric. Tongue midline. Normal strength, tone, reflexes and coordination. Normal sensation. Gait deferred.  ASSESSMENT/PLAN Ms. MARIT GOODWILL is a 86 y.o. female with history of HTN, HLD, thoracic aortic aneurysm who  was admitted following a fall with R femoral fx s/p THR noted to have aphasia.   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  Code Stroke CT head possible R middle frontal gyrus cortical infarct. Tiny age indeterminate R cerebellar infarct. ASPECTS 9    CTA head & neck no LVO. Min atherosclerosis head and neck. Mild fusiform enlargement posterior R MCA.  MRI  L>R multifocal ischemia B hemispheres, largest L frontal operculum. B frontal opercula w/ petechial hemorrhage  LE Doppler  pending   TCD w/ bubble pending   2D Echo EF 60-70%. No source of embolus   LDL 73  HgbA1c 5.6  VTE prophylaxis - Lovenox 40 mg sq daily   aspirin 81 mg daily prior to admission, now on No antithrombotic. Add aspirin 81 for secondary stroke prevention  Therapy recommendations:  SNF  Disposition:  pending  (lived alone PTA)  Hypertension  Stable . Permissive hypertension (OK if < 220/120) but gradually normalize in 5-7 days . Long-term BP goal normotensive  Hyperlipidemia  Home meds:  pravachol 80  Now on pravachol 40  LDL 73, goal < 70  Recommend Increase pravachol to home dose of 80    Continue statin at discharge  Other Stroke Risk Factors  Advanced age  Other Active Problems  Comminuted impacted subcapital right femoral fracture s/p THR 9/26  Leukocytosis WBC 14.7  Hospital day # 5 She presented with by cerebral embolic infarcts following recent hip surgery.  Recommend continue cardiac monitoring for A. fib, check echocardiogram for cardiac source of embolism, transplant Doppler bubble study for PFO and lower extremity Dopplers for DVT.  On discussion with patient and her friend at the bedside and answered questions.  Greater than 50% time during the 35-minute visit was spent on counseling and coordination of care about her embolic strokes discussion with care team and answering questions Delia Heady,  MD  To contact Stroke Continuity provider, please refer to [email or web address]. After hours, contact General Neurology

## 2020-01-31 NOTE — TOC Progression Note (Signed)
Transition of Care [facility]) - Progression Note    Patient Details  Name: Tracey Holmes MRN: [id number] Date of Birth: 04-Aug-1933  Transition of Care [facility]) CM/SW Contact  Chana Bode, Student-Social Work Phone Number: 01/31/2020, 2:23 PM  Clinical Narrative:   CSW and MSW Student spoke with patients' niece who wants patient to go to SNF. CSW and MSW Student provided CMS choice list for SNF options and will tell CSW her top three choices for placement tomorrow.     Expected Discharge Plan: Skilled Nursing Facility Barriers to Discharge: Continued Medical Work up, English as a second language teacher  Expected Discharge Plan and Services Expected Discharge Plan: Skilled Nursing Facility     Post Acute Care Choice: Horticulturist, commercial, Home Health Living arrangements for the past 2 months: Single Family Home                           HH Arranged: PT, OT   Date HH Agency Contacted: 01/29/20 Time HH Agency Contacted: 1551 Representative spoke with at [facility] Agency: Kandee Keen   Social Determinants of Health (SDOH) Interventions    Readmission Risk Interventions No flowsheet data found.

## 2020-01-31 NOTE — Progress Notes (Signed)
  Echocardiogram 2D Echocardiogram has been performed.  Tracey Holmes 01/31/2020, 9:16 AM

## 2020-01-31 NOTE — NC FL2 (Addendum)
[place] MEDICAID [id number] LEVEL OF CARE SCREENING TOOL     IDENTIFICATION  Patient Name: Tracey Holmes Birthdate: [birth year] Sex: female Admission Date (Current Location): 01/26/2020  [facility] and [place] Number:  [employer] and Address:  [facility] [place], [address] [place], [place] [address]      Provider Number: [id number]  Attending Physician Name and Address:  Salomon Mast*  Relative Name and Phone Number:  Fannie Knee    Current Level of Care: Hospital Recommended Level of Care: Skilled Nursing Facility Prior Approval Number:    Date Approved/Denied:   PASRR Number: [id number]  Discharge Plan: SNF    Current Diagnoses: Patient Active Problem List   Diagnosis Date Noted  . Aphasia   . Acute embolic stroke (HCC)   . Closed right hip fracture (HCC) 01/26/2020  . Thoracic aortic aneurysm without rupture (HCC) 02/01/2019  . HTN (hypertension)   . Personal history of kidney stones   . Cystic kidney disease   . Dyslipidemia   . HYPERLIPIDEMIA 11/17/2007  . HYPERTENSION 11/17/2007  . LUNG NODULE 11/17/2007  . RASH AND OTHER NONSPECIFIC SKIN ERUPTION 11/10/2007    Orientation RESPIRATION BLADDER Height & Weight     Self  Normal Continent Weight: 121 lb 7.6 oz (55.1 kg) Height:  5\' 3"  (160 cm)  BEHAVIORAL SYMPTOMS/MOOD NEUROLOGICAL BOWEL NUTRITION STATUS      Continent Diet (See DC Summary)  AMBULATORY STATUS COMMUNICATION OF NEEDS Skin   Extensive Assist Verbally Surgical wounds (Closed right hip, adhesive bandage)                       Personal Care Assistance Level of Assistance  Bathing, Dressing, Feeding Bathing Assistance: Maximum assistance Feeding assistance: Limited assistance Dressing Assistance: Maximum assistance     Functional Limitations Info  Speech     Speech Info: Impaired (Expressed Aphasia)    SPECIAL CARE FACTORS FREQUENCY  PT (By licensed PT), OT (By licensed OT), Speech therapy      PT Frequency: 5xweek OT Frequency: 5xweek     Speech Therapy Frequency: 5xweek      Contractures Contractures Info: Not present    Additional Factors Info  Code Status, Allergies Code Status Info: Full Allergies Info: Penicillins           Current Medications (01/31/2020):  This is the current hospital active medication list Current Facility-Administered Medications  Medication Dose Route Frequency Provider Last Rate Last Admin  . acetaminophen (TYLENOL) tablet 650 mg  650 mg Oral Q6H PRN 02/02/2020, MD   650 mg at 01/28/20 2117   Or  . acetaminophen (TYLENOL) suppository 650 mg  650 mg Rectal Q6H PRN 2118, MD      . acetaminophen (TYLENOL) tablet 325-650 mg  325-650 mg Oral Q6H PRN Swinteck, Samson Frederic, MD      . aspirin EC tablet 81 mg  81 mg Oral Daily Arlys John, MD   81 mg at 01/31/20 1053  . brimonidine (ALPHAGAN) 0.2 % ophthalmic solution 1 drop  1 drop Both Eyes TID 02/02/20, MD   1 drop at 01/31/20 1054  . docusate sodium (COLACE) capsule 100 mg  100 mg Oral BID 02/02/20, MD   100 mg at 01/31/20 1053  . enoxaparin (LOVENOX) injection 40 mg  40 mg Subcutaneous Q24H 02/02/20, MD   40 mg at 01/31/20 1054  . HYDROcodone-acetaminophen (NORCO) 7.5-325 MG per tablet 1-2 tablet  1-2 tablet Oral  Q4H PRN Samson Frederic, MD      . HYDROcodone-acetaminophen (NORCO/VICODIN) 5-325 MG per tablet 1-2 tablet  1-2 tablet Oral Q4H PRN Swinteck, Arlys John, MD      . menthol-cetylpyridinium (CEPACOL) lozenge 3 mg  1 lozenge Oral PRN Swinteck, Arlys John, MD       Or  . phenol (CHLORASEPTIC) mouth spray 1 spray  1 spray Mouth/Throat PRN Swinteck, Arlys John, MD      . metoCLOPramide (REGLAN) tablet 5-10 mg  5-10 mg Oral Q8H PRN Swinteck, Arlys John, MD       Or  . metoCLOPramide (REGLAN) injection 5-10 mg  5-10 mg Intravenous Q8H PRN Swinteck, Arlys John, MD      . morphine 2 MG/ML injection 0.5-1 mg  0.5-1 mg Intravenous Q2H PRN Swinteck, Arlys John, MD      . morphine 2 MG/ML  injection 2 mg  2 mg Intravenous Q2H PRN Samson Frederic, MD   2 mg at 01/30/20 0354  . ondansetron (ZOFRAN) tablet 4 mg  4 mg Oral Q6H PRN Swinteck, Arlys John, MD       Or  . ondansetron (ZOFRAN) injection 4 mg  4 mg Intravenous Q6H PRN Swinteck, Arlys John, MD      . polyethylene glycol (MIRALAX / GLYCOLAX) packet 17 g  17 g Oral Daily PRN Swinteck, Arlys John, MD      . pravastatin (PRAVACHOL) tablet 40 mg  40 mg Oral Daily Samson Frederic, MD   40 mg at 01/31/20 1053     Discharge Medications: Please see discharge summary for a list of discharge medications.  Relevant Imaging Results:  Relevant Lab Results:   Additional Information SS#: [id number]  Chana Bode, Student-Social Work

## 2020-01-31 NOTE — Evaluation (Signed)
Physical Therapy Re-Evaluation Patient Details Name: Tracey Holmes MRN: [id number] DOB: Oct 17, 1933 Today's Date: 01/31/2020   History of Present Illness  86yo female who reports a fall at home, found to have comminuted impacted subcapital R femoral fracture. Received R anterior approach THA  01/28/20.  Noted change in status with new onset aphasia 01/30/20, MRI showing Multifocal acute ischemia within both hemispheres, left worse than right.   PMH HTN, HLD, thoracic aneurysm  Clinical Impression  Patient presents with decreased mobility from her fall and hip fracture, now with global aphasia with decreased safety awareness, and high fall risk not progressing R LE well during in room ambulation.  She will benefit from continuing skilled PT in the acute setting and will need follow up SNF level rehab at d/c.  Niece here from [place] working to set up care for pt prior to going home.  LCSW made aware.     Follow Up Recommendations SNF;Supervision/Assistance - 24 hour    Equipment Recommendations  Rolling walker with 5" wheels;3in1 (PT)    Recommendations for Other Services       Precautions / Restrictions Precautions Precautions: Fall Precaution Comments: no hip precautions, direct anterior Restrictions RLE Weight Bearing: Weight bearing as tolerated      Mobility  Bed Mobility Overal bed mobility: Needs Assistance Bed Mobility: Supine to Sit     Supine to sit: Mod assist     General bed mobility comments: assist to scoot hips and lift trunk  Transfers Overall transfer level: Needs assistance Equipment used: Rolling walker (2 wheeled) Transfers: Sit to/from Stand Sit to Stand: Mod assist         General transfer comment: cues to push from bed, but pulled up on walker; sat on 3:1 and was able to push up with both hands off armrests to stand after attempting toileting  Ambulation/Gait Ambulation/Gait assistance: Mod assist;+2 safety/equipment Gait Distance (Feet): 15  Feet Assistive device: Rolling walker (2 wheeled) Gait Pattern/deviations: Step-to pattern;Decreased stride length     General Gait Details: assist to progress R LE, pt not taking time to stop for R LE progression at times unsafely moving forward prior to assist to move the leg.  Stairs            Wheelchair Mobility    Modified Rankin (Stroke Patients Only) Modified Rankin (Stroke Patients Only) Pre-Morbid Rankin Score: Moderately severe disability Modified Rankin: Moderately severe disability     Balance Overall balance assessment: Needs assistance Sitting-balance support: Feet supported Sitting balance-Leahy Scale: Good Sitting balance - Comments: seated EOB for brushing teeth for about 5 minutes   Standing balance support: Bilateral upper extremity supported Standing balance-Leahy Scale: Poor Standing balance comment: UE support for balance                             Pertinent Vitals/Pain Pain Assessment: Faces Faces Pain Scale: Hurts a little bit Pain Location: RLE with weight bearing Pain Descriptors / Indicators: Grimacing;Guarding Pain Intervention(s): Monitored during session;Repositioned    Home Living Family/patient expects to be discharged to:: Skilled nursing facility Living Arrangements: Alone Available Help at Discharge: Family Type of Home: House Home Access: Stairs to enter Entrance Stairs-Rails: Can reach both Entrance Stairs-Number of Steps: 4 steps in the front, 2 steps in the back; talking about building a ramp soon Home Layout: One level Home Equipment: Shower seat;Cane - single point Additional Comments: friend has a walker she can borrow if needed  Prior Function Level of Independence: Independent         Comments: neice here from [place] and was planning to take pt home to work out care after her hip fracture, but interested in SNF since stroke.     Hand Dominance        Extremity/Trunk Assessment   Upper  Extremity Assessment Upper Extremity Assessment: Defer to OT evaluation    Lower Extremity Assessment Lower Extremity Assessment: RLE deficits/detail RLE Deficits / Details: unable to lift her leg off bed nor to progress with ambulation, not sure if was prior to stroke as had direct anterior THA on 9/26       Communication   Communication: Expressive difficulties (expressive and receptive aphasia)  Cognition Arousal/Alertness: Awake/alert Behavior During Therapy: WFL for tasks assessed/performed Overall Cognitive Status: Impaired/Different from baseline Area of Impairment: Following commands;Safety/judgement;Awareness;Problem solving                   Current Attention Level: Sustained Memory: Decreased recall of precautions;Decreased short-term memory Following Commands: Follows one step commands inconsistently;Follows one step commands with increased time Safety/Judgement: Decreased awareness of safety;Decreased awareness of deficits   Problem Solving: Slow processing;Difficulty sequencing;Requires verbal cues;Requires tactile cues (multimodal cues) General Comments: needs both verbal, tactile and gestures for command following due to aphasia      General Comments General comments (skin integrity, edema, etc.): neice from [place] in the room and reports plans for SNF, hopes to talk to social worker; patient independently asking to brush her teeth, but unable to state her name unless parrotted and same with calling name of her neice    Exercises     Assessment/Plan    PT Assessment Patient needs continued PT services  PT Problem List Decreased strength;Decreased mobility;Decreased safety awareness;Decreased knowledge of use of DME;Decreased balance;Decreased cognition;Decreased knowledge of precautions       PT Treatment Interventions DME instruction;Therapeutic activities;Gait training;Therapeutic exercise;Patient/family education;Balance training;Functional mobility  training    PT Goals (Current goals can be found in the Care Plan section)  Acute Rehab PT Goals Patient Stated Goal: for SNF rehab PT Goal Formulation: With patient/family Time For Goal Achievement: 02/14/20 Potential to Achieve Goals: Fair    Frequency Min 4X/week   Barriers to discharge        Co-evaluation               AM-PAC PT "6 Clicks" Mobility  Outcome Measure Help needed turning from your back to your side while in a flat bed without using bedrails?: A Lot Help needed moving from lying on your back to sitting on the side of a flat bed without using bedrails?: A Lot Help needed moving to and from a bed to a chair (including a wheelchair)?: A Lot Help needed standing up from a chair using your arms (e.g., wheelchair or bedside chair)?: A Lot Help needed to walk in hospital room?: A Lot Help needed climbing 3-5 steps with a railing? : Total 6 Click Score: 11    End of Session Equipment Utilized During Treatment: Gait belt Activity Tolerance: Patient tolerated treatment well Patient left: in chair;with call bell/phone within reach;with chair alarm set;with family/visitor present   PT Visit Diagnosis: History of falling (Z91.81);Muscle weakness (generalized) (M62.81);Other symptoms and signs involving the nervous system (R29.898);Other abnormalities of gait and mobility (R26.89)    Time: 1300-1315 PT Time Calculation (min) (ACUTE ONLY): 15 min   Charges:   PT Evaluation $PT Re-evaluation: 1 Re-eval  Sheran Lawless, PT Acute Rehabilitation Services Pager:[phone] Office:[phone] 01/31/2020   Elray Mcgregor 01/31/2020, 3:48 PM

## 2020-01-31 NOTE — Progress Notes (Signed)
PROGRESS NOTE    Tracey Holmes  ZOX:[id number]  DOB: 1933-09-13  DOA: 01/26/2020 PCP: Laurann Montana, MD Outpatient Specialists:   Hospital course:  86 year old female with HTN, cystic kidney disease, AAA and history of kidney stone was admitted 01/26/2020 with hip fracture after mechanical fall.  Noted to have a comminuted impacted subcapital right femoral fracture.  She underwent surgery 01/28/2020.  Course has been complicated by acute onset of expressive aphasia with code stroke being called.  CT shows right middle frontal gyrus cortical infarct.   Subjective:  Patient unable to speak other than few words.  She clearly has expressive aphasia.  She is able to say that she is okay when asked how she is doing.   Objective: Vitals:   01/31/20 0340 01/31/20 0720 01/31/20 1108 01/31/20 1622  BP: (!) 126/55 132/72 (!) 157/75 (!) 123/57  Pulse: 87 98 95 (!) 107  Resp: Temp: 98.2 F (36.8 C) 99.2 F (37.3 C) 98.7 F (37.1 C) 97.8 F (36.6 C)  TempSrc:  Oral Oral Oral  SpO2: 95% 99% 100% 96%  Weight: 55.1 kg     Height:       No intake or output data in the 24 hours ending 01/31/20 1701 Filed Weights   01/26/20 1740 01/29/20 0449 01/31/20 0340  Weight: 51.3 kg 54.4 kg 55.1 kg     Exam:  General: Elderly patient sitting up in bed in no acute distress having difficulty expressing herself. Eyes: sclera anicteric, conjuctiva mild injection bilaterally CVS: S1-S2, regular  Respiratory:  decreased air entry bilaterally secondary to decreased inspiratory effort, rales at bases  GI: NABS, soft, NT  LE: CDI Neuro: Clear expressive aphasia     Assessment & Plan:   Left hip fracture Status post right hip arthroplasty 01/28/2020 Being followed by PT and OT After discussion with family, plan is for discharge home with home health services.  Acute stroke Patient with apparent new expressive aphasia MRI shows acute multifocal infarcts bilaterally TTE with  bubble study is pending PT OT and speech are following Permissive hypertension Continue telemetry monitoring Patient is on aspirin 81 mg daily Neurology continues to follow  HTN Permissive hypertension  HL Maximize statins    DVT prophylaxis: Enoxaparin Code Status: Full Family Communication: Attempt to contact family Disposition Plan:   Patient is from: Home  Anticipated Discharge Location: Home with home health per patient and family preference  Barriers to Discharge: Acute stroke  Is patient medically stable for Discharge: No   Consultants:  Orthopedics  Procedures:  Right hip arthroplasty 01/28/2020  Antimicrobials:  None   Data Reviewed:  Basic Metabolic Panel: Recent Labs  Lab 01/27/20 0621 01/28/20 0155 01/29/20 1444 01/30/20 0437 01/31/20 0656  NA 138 136 139 138 139  K 4.3 4.2 3.7 3.7 3.7  CL 103 106 109 107 110  CO2 23 21* 20* 21* 17*  GLUCOSE 140* 114* 207* 112* 119*  BUN 13 15 26* 23 17  CREATININE 0.61 0.81 0.98 0.70 0.56  CALCIUM 8.6* 8.3* 7.9* 7.7* 7.8*  MG 2.1  --   --   --   --    Liver Function Tests: Recent Labs  Lab 01/27/20 0621 01/31/20 0656  AST 25 32  ALT 23 24  ALKPHOS 44 52  BILITOT 1.1 1.2  PROT 6.9 5.2*  ALBUMIN 4.0 2.7*   No results for input(s): LIPASE, AMYLASE in the last 168 hours. No results for input(s): AMMONIA in the last  168 hours. CBC: Recent Labs  Lab 01/26/20 1908 01/26/20 1908 01/27/20 0621 01/28/20 0155 01/29/20 1444 01/30/20 0437 01/31/20 0656  WBC 14.7*   < > 12.7* 11.4* 10.8* 10.2 9.9  NEUTROABS 12.1*  --  9.5* 8.0*  --   --   --   HGB 14.8   < > 15.3* 14.1 9.9* 8.7* 9.2*  HCT 46.0   < > 46.4* 43.7 30.9* 26.9* 28.7*  MCV 98.9   < > 96.3 96.9 100.3* 97.8 101.1*  PLT 243   < > 237 220 192 213 223   < > = values in this interval not displayed.   Cardiac Enzymes: No results for input(s): CKTOTAL, CKMB, CKMBINDEX, TROPONINI in the last 168 hours. BNP (last 3 results) No results for  input(s): PROBNP in the last 8760 hours. CBG: Recent Labs  Lab 01/30/20 0959  GLUCAP 92    Recent Results (from the past 240 hour(s))  Respiratory Panel by RT PCR (Flu A&B, Covid) - Nasopharyngeal Swab     Status: None   Collection Time: 01/26/20 10:50 PM   Specimen: Nasopharyngeal Swab  Result Value Ref Range Status   SARS Coronavirus 2 by RT PCR NEGATIVE NEGATIVE Final    Comment: (NOTE) SARS-CoV-2 target nucleic acids are NOT DETECTED.  The SARS-CoV-2 RNA is generally detectable in upper respiratoy specimens during the acute phase of infection. The lowest concentration of SARS-CoV-2 viral copies this assay can detect is 131 copies/mL. A negative result does not preclude SARS-Cov-2 infection and should not be used as the sole basis for treatment or other patient management decisions. A negative result may occur with  improper specimen collection/handling, submission of specimen other than nasopharyngeal swab, presence of viral mutation(s) within the areas targeted by this assay, and inadequate number of viral copies (<131 copies/mL). A negative result must be combined with clinical observations, patient history, and epidemiological information. The expected result is Negative.  Fact Sheet for Patients:  https://www.moore.com/  Fact Sheet for Healthcare Providers:  https://www.young.biz/  This test is no t yet approved or cleared by the [place] FDA and  has been authorized for detection and/or diagnosis of SARS-CoV-2 by FDA under an Emergency Use Authorization (EUA). This EUA will remain  in effect (meaning this test can be used) for the duration of the COVID-19 declaration under Section 564(b)(1) of the Act, 21 U.S.C. section 360bbb-3(b)(1), unless the authorization is terminated or revoked sooner.     Influenza A by PCR NEGATIVE NEGATIVE Final   Influenza B by PCR NEGATIVE NEGATIVE Final    Comment: (NOTE) The Xpert Xpress  SARS-CoV-2/FLU/RSV assay is intended as an aid in  the diagnosis of influenza from Nasopharyngeal swab specimens and  should not be used as a sole basis for treatment. Nasal washings and  aspirates are unacceptable for Xpert Xpress SARS-CoV-2/FLU/RSV  testing.  Fact Sheet for Patients: https://www.moore.com/  Fact Sheet for Healthcare Providers: https://www.young.biz/  This test is not yet approved or cleared by the [place] FDA and  has been authorized for detection and/or diagnosis of SARS-CoV-2 by  FDA under an Emergency Use Authorization (EUA). This EUA will remain  in effect (meaning this test can be used) for the duration of the  Covid-19 declaration under Section 564(b)(1) of the Act, 21  U.S.C. section 360bbb-3(b)(1), unless the authorization is  terminated or revoked. Performed at [facility], [address], [place], [place] [address]   Surgical pcr screen     Status: None   Collection  Time: 01/27/20  5:25 PM   Specimen: Nasal Mucosa; Nasal Swab  Result Value Ref Range Status   MRSA, PCR NEGATIVE NEGATIVE Final   Staphylococcus aureus NEGATIVE NEGATIVE Final    Comment: (NOTE) The Xpert SA Assay (FDA approved for NASAL specimens in patients 422 years of age and older), is one component of a comprehensive surveillance program. It is not intended to diagnose infection nor to guide or monitor treatment. Performed at [facility], [address] [place], [place] [address]       Studies: CT ANGIO HEAD W OR WO CONTRAST  Result Date: 01/30/2020 CLINICAL DATA:  86 year old female with recent right hip surgery, acute neurologic deficit with aphasia. EXAM: CT ANGIOGRAPHY HEAD AND NECK TECHNIQUE: Multidetector CT imaging of the head and neck was performed using the standard protocol during bolus administration of intravenous contrast. Multiplanar CT image reconstructions and MIPs were obtained to evaluate the vascular anatomy.  Carotid stenosis measurements (when applicable) are obtained utilizing NASCET criteria, using the distal internal carotid diameter as the denominator. CONTRAST:  75mL OMNIPAQUE IOHEXOL 350 MG/ML SOLN COMPARISON:  Plain head CT 0909 hours today FINDINGS: CTA NECK Skeleton: Right greater than left TMJ degeneration. Exaggerated cervical lordosis. No acute osseous abnormality identified. Upper chest: Negative. Other neck: No acute findings. Aortic arch: Mildly ectatic aortic arch with 3 vessel arch configuration. No arch atherosclerosis. Right carotid system: Tortuous brachiocephalic artery and proximal right CCA. Capacious right carotid bifurcation. Tortuous right ICA without plaque or stenosis. Left carotid system: Normal left CCA origin. Highly tortuous left CCA at the thoracic inlet (series 5, image 127). Capacious left carotid bifurcation. No stenosis. Vertebral arteries: Mild soft and calcified plaque at the right subclavian artery origin without stenosis. Normal right vertebral artery origin. Non dominant right vertebral artery appears patent and normal to the skull base. No proximal left subclavian plaque or stenosis. Normal left vertebral artery origin. Tortuous left V1 segment. Mildly dominant left vertebral artery otherwise patent and normal to the skull base. CTA HEAD Posterior circulation: Mildly dominant left V4 segment. No distal vertebral plaque or stenosis. Normal PICA origins and vertebrobasilar junction. Patent mildly tortuous basilar artery without stenosis. Normal SCA and left PCA origins. Fetal type right PCA origin. Left posterior communicating is diminutive or absent. Bilateral PCA branches are mildly tortuous and otherwise within normal limits. Anterior circulation: Both ICA siphons are patent with only trace calcified plaque on the left and no stenosis. Ectatic right siphon. Ophthalmic and right posterior communicating artery origins are normal. Patent carotid termini. Normal MCA and ACA  origins. Anterior communicating artery is within normal limits. ACA branches are within normal limits. Left MCA M1 segment and bifurcation are patent without stenosis. Left MCA branches are within normal limits. Right MCA M1 segment and bifurcation are patent without stenosis. A somewhat diminutive anterior right M2 branch is noted (series 11, image 18), beyond which there is tortuosity and mild focal fusiform enlargement of the continuing posterior M2 (series 12, image 14). A downstream trifurcation is widely patent without stenosis. And no discrete right MCA branch occlusion is identified. Venous sinuses: Patent. Anatomic variants: Mildly dominant left vertebral artery. Fetal type right PCA origin. Review of the MIP images confirms the above findings IMPRESSION: 1. Negative for large vessel occlusion. 2. Minimal atherosclerosis in the head and neck, but there is generalized arterial tortuosity and ectasia. A mild fusiform enlargement of the dominant posterior Right MCA division in noted, with no discrete intracranial aneurysm. Salient findings reviewed in person  with Dr. Marisue Humble on 01/30/2020 at 0938 hours. Electronically Signed   By: Odessa Fleming M.D.   On: 01/30/2020 09:47   DG Abd 1 View  Result Date: 01/30/2020 CLINICAL DATA:  Foreign body EXAM: ABDOMEN - 1 VIEW COMPARISON:  None. FINDINGS: Contrast material in the urinary bladder. Nonobstructed bowel-gas pattern. No radiopaque foreign body over the abdomen. Status post right hip replacement. IMPRESSION: Nonobstructed bowel-gas pattern. Electronically Signed   By: Jasmine Pang M.D.   On: 01/30/2020 20:07   MR BRAIN WO CONTRAST  Result Date: 01/30/2020 CLINICAL DATA:  Stroke follow-up EXAM: MRI HEAD WITHOUT CONTRAST TECHNIQUE: Multiplanar, multiecho pulse sequences of the brain and surrounding structures were obtained without intravenous contrast. COMPARISON:  Head CT 01/30/2020 FINDINGS: Brain: Multifocal abnormal diffusion restriction within both  hemispheres. The largest area is in the left frontal operculum. This is in a location in keeping with aphasia. The largest contralateral abnormality is at the middle frontal gyrus. There is petechial hemorrhage within both frontal opercula. Multifocal hyperintense T2-weighted signal within the white matter. There is generalized atrophy without lobar predilection. Old small vessel infarct of the right cerebellum. No chronic microhemorrhage. Normal midline structures. Vascular: Normal flow voids Skull and upper cervical spine: Normal Sinuses/Orbits: Bilateral lens replacements. Paranasal sinuses are clear. No mastoid effusion. Normal nasopharynx. Other: None IMPRESSION: 1. Multifocal acute ischemia within both hemispheres, left worse than right. The largest infarct, involving the left frontal operculum, is in keeping with reported aphasia. 2. Petechial hemorrhage within both frontal opercula. No mass effect. (Heidelberg classification 1b: HI2, confluent petechiae, no mass effect.) Electronically Signed   By: Deatra Robinson M.D.   On: 01/30/2020 19:03   ECHOCARDIOGRAM COMPLETE  Result Date: 01/31/2020    ECHOCARDIOGRAM REPORT   Patient Name:   Tracey Holmes Date of Exam: 01/31/2020 Medical Rec #:  [id number]         Height:       63.0 in Accession #:    [id number]        Weight:       121.5 lb Date of Birth:  [birth year]         BSA:          1.564 m Patient Age:    86 years          BP:           132/72 mmHg Patient Gender: F                 HR:           99 bpm. Exam Location:  Inpatient Procedure: 2D Echo, Cardiac Doppler and Color Doppler Indications:    Stroke  History:        Patient has no prior history of Echocardiogram examinations.                 Risk Factors:Hypertension and Dyslipidemia. Thoracic aortic                 aneurysm.  Sonographer:    Sheralyn Boatman RDCS Referring Phys: [id number] STEPHEN K CHIU  Sonographer Comments: Technically difficult study due to poor echo windows. IMPRESSIONS  1. There is  acceleration of flow through left ventricular outflow tract. Non obstructive. Left ventricular ejection fraction, by estimation, is 65 to 70%. The left ventricle has normal function. The left ventricle has no regional wall motion abnormalities. Left ventricular diastolic parameters are consistent with Grade I diastolic dysfunction (impaired relaxation).  2. Right ventricular systolic function  is normal. The right ventricular size is normal.  3. The mitral valve is normal in structure. No evidence of mitral valve regurgitation. No evidence of mitral stenosis.  4. The aortic valve is normal in structure. Aortic valve regurgitation is not visualized. No aortic stenosis is present.  5. Aortic dilatation noted. There is mild dilatation of the ascending aorta, measuring 40 mm.  6. The inferior vena cava is normal in size with greater than 50% respiratory variability, suggesting right atrial pressure of 3 mmHg. Conclusion(s)/Recommendation(s): No intracardiac source of embolism detected on this transthoracic study. A transesophageal echocardiogram is recommended to exclude cardiac source of embolism if clinically indicated. FINDINGS  Left Ventricle: There is acceleration of flow through left ventricular outflow tract. Non obstructive. Left ventricular ejection fraction, by estimation, is 65 to 70%. The left ventricle has normal function. The left ventricle has no regional wall motion abnormalities. The left ventricular internal cavity size was normal in size. There is no left ventricular hypertrophy. Left ventricular diastolic parameters are consistent with Grade I diastolic dysfunction (impaired relaxation). Right Ventricle: The right ventricular size is normal. No increase in right ventricular wall thickness. Right ventricular systolic function is normal. Left Atrium: Left atrial size was normal in size. Right Atrium: Right atrial size was normal in size. Pericardium: There is no evidence of pericardial effusion. Mitral  Valve: The mitral valve is normal in structure. No evidence of mitral valve regurgitation. No evidence of mitral valve stenosis. MV peak gradient, 14.4 mmHg. The mean mitral valve gradient is 3.0 mmHg. Tricuspid Valve: The tricuspid valve is normal in structure. Tricuspid valve regurgitation is trivial. No evidence of tricuspid stenosis. Aortic Valve: The aortic valve is normal in structure. Aortic valve regurgitation is not visualized. No aortic stenosis is present. Pulmonic Valve: The pulmonic valve was normal in structure. Pulmonic valve regurgitation is not visualized. No evidence of pulmonic stenosis. Aorta: Aortic dilatation noted. There is mild dilatation of the ascending aorta, measuring 40 mm. Venous: The inferior vena cava is normal in size with greater than 50% respiratory variability, suggesting right atrial pressure of 3 mmHg. IAS/Shunts: No atrial level shunt detected by color flow Doppler.  LEFT VENTRICLE PLAX 2D LVIDd:         2.90 cm     Diastology LVIDs:         2.10 cm     LV e' medial:    9.06 cm/s LV PW:         1.10 cm     LV E/e' medial:  10.8 LV IVS:        1.10 cm     LV e' lateral:   10.90 cm/s LVOT diam:     1.80 cm     LV E/e' lateral: 9.0 LV SV:         56 LV SV Index:   36 LVOT Area:     2.54 cm  LV Volumes (MOD) LV vol d, MOD A2C: 51.0 ml LV vol d, MOD A4C: 40.2 ml LV vol s, MOD A2C: 17.2 ml LV vol s, MOD A4C: 16.5 ml LV SV MOD A2C:     33.8 ml LV SV MOD A4C:     40.2 ml LV SV MOD BP:      30.0 ml RIGHT VENTRICLE             IVC RV S prime:     13.20 cm/s  IVC diam: 1.60 cm TAPSE (M-mode): 1.9 cm LEFT ATRIUM  Index       RIGHT ATRIUM          Index LA diam:        2.30 cm 1.47 cm/m  RA Area:     8.57 cm LA Vol (A2C):   18.2 ml 11.63 ml/m RA Volume:   15.70 ml 10.04 ml/m LA Vol (A4C):   23.8 ml 15.21 ml/m LA Biplane Vol: 21.8 ml 13.94 ml/m  AORTIC VALVE LVOT Vmax:   137.00 cm/s LVOT Vmean:  83.900 cm/s LVOT VTI:    0.221 m  AORTA Ao Root diam: 2.80 cm Ao Asc diam:   4.05 cm Ao Arch diam: 4.0 cm MITRAL VALVE MV Area (PHT): 3.31 cm     SHUNTS MV Peak grad:  14.4 mmHg    Systemic VTI:  0.22 m MV Mean grad:  3.0 mmHg     Systemic Diam: 1.80 cm MV Vmax:       1.90 m/s MV Vmean:      73.2 cm/s MV Decel Time: 229 msec MV E velocity: 98.10 cm/s MV A velocity: 157.00 cm/s MV E/A ratio:  0.62 Donato Schultz MD Electronically signed by Donato Schultz MD Signature Date/Time: 01/31/2020/10:40:23 AM    Final    CT HEAD CODE STROKE WO CONTRAST  Result Date: 01/30/2020 CLINICAL DATA:  Code stroke. 86 year old female with aphasia. Recent right hip surgery. EXAM: CT HEAD WITHOUT CONTRAST TECHNIQUE: Contiguous axial images were obtained from the base of the skull through the vertex without intravenous contrast. COMPARISON:  None. FINDINGS: Brain: Faint dystrophic calcifications in the bilateral basal ganglia. No acute intracranial hemorrhage identified. No midline shift, mass effect, or evidence of intracranial mass lesion. No ventriculomegaly. Largely normal gray-white matter differentiation throughout the brain. There is a small area of cortical hypodensity in the right middle frontal gyrus on series 3, image 26. And there is evidence of a tiny age indeterminate right cerebellar infarct on series 3, image 8. No other changes of acute cortically based infarct. Vascular: Mild for age Calcified atherosclerosis at the skull base. No suspicious intracranial vascular hyperdensity. Skull: Severe TMJ degeneration on the right. No acute osseous abnormality identified. Sinuses/Orbits: Visualized paranasal sinuses and mastoids are clear. Other: Postoperative changes to both globes. No acute orbit or scalp soft tissue finding. ASPECTS [facility] Stroke Program Early CT Score) - Ganglionic level infarction (caudate, lentiform nuclei, internal capsule, insula, M1-M3 cortex): 7 - Supraganglionic infarction (M4-M6 cortex): 2 (right M5). Total score (0-10 with 10 being normal): 9 (on the right). IMPRESSION: 1. No  prior study. Possible small right middle frontal gyrus cortical infarct. ASPECTS 9. 2. No intracranial hemorrhage or mass effect. 3. Tiny age indeterminate right cerebellar infarct. 4. These results were communicated to Dr. Thomasena Edis at 9:24 am on 01/30/2020 by text page via the [facility] messaging system. Electronically Signed   By: Odessa Fleming M.D.   On: 01/30/2020 09:26   CT ANGIO NECK CODE STROKE  Result Date: 01/30/2020 CLINICAL DATA:  86 year old female with recent right hip surgery, acute neurologic deficit with aphasia. EXAM: CT ANGIOGRAPHY HEAD AND NECK TECHNIQUE: Multidetector CT imaging of the head and neck was performed using the standard protocol during bolus administration of intravenous contrast. Multiplanar CT image reconstructions and MIPs were obtained to evaluate the vascular anatomy. Carotid stenosis measurements (when applicable) are obtained utilizing NASCET criteria, using the distal internal carotid diameter as the denominator. CONTRAST:  75mL OMNIPAQUE IOHEXOL 350 MG/ML SOLN COMPARISON:  Plain head CT 0909 hours today FINDINGS: CTA NECK Skeleton:  Right greater than left TMJ degeneration. Exaggerated cervical lordosis. No acute osseous abnormality identified. Upper chest: Negative. Other neck: No acute findings. Aortic arch: Mildly ectatic aortic arch with 3 vessel arch configuration. No arch atherosclerosis. Right carotid system: Tortuous brachiocephalic artery and proximal right CCA. Capacious right carotid bifurcation. Tortuous right ICA without plaque or stenosis. Left carotid system: Normal left CCA origin. Highly tortuous left CCA at the thoracic inlet (series 5, image 127). Capacious left carotid bifurcation. No stenosis. Vertebral arteries: Mild soft and calcified plaque at the right subclavian artery origin without stenosis. Normal right vertebral artery origin. Non dominant right vertebral artery appears patent and normal to the skull base. No proximal left subclavian plaque or stenosis.  Normal left vertebral artery origin. Tortuous left V1 segment. Mildly dominant left vertebral artery otherwise patent and normal to the skull base. CTA HEAD Posterior circulation: Mildly dominant left V4 segment. No distal vertebral plaque or stenosis. Normal PICA origins and vertebrobasilar junction. Patent mildly tortuous basilar artery without stenosis. Normal SCA and left PCA origins. Fetal type right PCA origin. Left posterior communicating is diminutive or absent. Bilateral PCA branches are mildly tortuous and otherwise within normal limits. Anterior circulation: Both ICA siphons are patent with only trace calcified plaque on the left and no stenosis. Ectatic right siphon. Ophthalmic and right posterior communicating artery origins are normal. Patent carotid termini. Normal MCA and ACA origins. Anterior communicating artery is within normal limits. ACA branches are within normal limits. Left MCA M1 segment and bifurcation are patent without stenosis. Left MCA branches are within normal limits. Right MCA M1 segment and bifurcation are patent without stenosis. A somewhat diminutive anterior right M2 branch is noted (series 11, image 18), beyond which there is tortuosity and mild focal fusiform enlargement of the continuing posterior M2 (series 12, image 14). A downstream trifurcation is widely patent without stenosis. And no discrete right MCA branch occlusion is identified. Venous sinuses: Patent. Anatomic variants: Mildly dominant left vertebral artery. Fetal type right PCA origin. Review of the MIP images confirms the above findings IMPRESSION: 1. Negative for large vessel occlusion. 2. Minimal atherosclerosis in the head and neck, but there is generalized arterial tortuosity and ectasia. A mild fusiform enlargement of the dominant posterior Right MCA division in noted, with no discrete intracranial aneurysm. Salient findings reviewed in person with Dr. Marisue Humble on 01/30/2020 at 0938 hours.  Electronically Signed   By: Odessa Fleming M.D.   On: 01/30/2020 09:47     Scheduled Meds: . aspirin EC  81 mg Oral Daily  . brimonidine  1 drop Both Eyes TID  . docusate sodium  100 mg Oral BID  . enoxaparin (LOVENOX) injection  40 mg Subcutaneous Q24H  . pravastatin  40 mg Oral Daily   Continuous Infusions:  Active Problems:   Closed right hip fracture (HCC)   Aphasia   Acute embolic stroke (HCC)     Jerrad Mendibles Orma Flaming, [facility]  If 7PM-7AM, please contact night-coverage [email or web address] Password [email or web address] 01/31/2020, 5:01 PM    LOS: 5 days

## 2020-01-31 NOTE — Evaluation (Signed)
Occupational Therapy Evaluation Patient Details Name: Tracey Holmes MRN: [id number] DOB: 08-21-1933 Today's Date: 01/31/2020    History of Present Illness 86yo female who reports a fall at home, found to have comminuted impacted subcapital R femoral fracture. Received R anterior approach THA  01/28/20.  Noted change in status with new onset aphasia 01/30/20, MRI showing Multifocal acute ischemia within both hemispheres, left worse than right.   PMH HTN, HLD, thoracic aneurysm   Clinical Impression   Patient is s/p direct anterior hip surgery resulting in functional limitations due to the deficits listed below (see OT problem list). Pt with now bil CVA infarcts. Pt with niece present to help with d/c planning from [place]. Pt with echolia demonstrated during session.  Pt needs mod (A) for RW basic transfer. Patient will benefit from skilled OT acutely to increase independence and safety with ADLS to allow discharge SNF.     Follow Up Recommendations  SNF    Equipment Recommendations  3 in 1 bedside commode;Wheelchair (measurements OT);Wheelchair cushion (measurements OT);Hospital bed    Recommendations for Other Services Speech consult     Precautions / Restrictions Precautions Precautions: Fall Precaution Comments: no hip precautions, direct anterior Restrictions RLE Weight Bearing: Weight bearing as tolerated      Mobility Bed Mobility Overal bed mobility: Needs Assistance Bed Mobility: Supine to Sit     Supine to sit: Mod assist     General bed mobility comments: assist to scoot hips and lift trunk  Transfers Overall transfer level: Needs assistance Equipment used: Rolling walker (2 wheeled) Transfers: Sit to/from Stand Sit to Stand: Mod assist         General transfer comment: cues to push from bed, but pulled up on walker; sat on 3:1 and was able to push up with both hands off armrests to stand after attempting toileting    Balance Overall balance assessment:  Needs assistance Sitting-balance support: Feet supported Sitting balance-Leahy Scale: Good Sitting balance - Comments: seated EOB for brushing teeth for about 5 minutes   Standing balance support: Bilateral upper extremity supported Standing balance-Leahy Scale: Poor Standing balance comment: UE support for balance                           ADL either performed or assessed with clinical judgement   ADL Overall ADL's : Needs assistance/impaired     Grooming: Oral care;Set up;Bed level Grooming Details (indicate cue type and reason): fixated on brushing and almost preseverating                 Toilet Transfer: Moderate assistance;Ambulation;BSC;RW Toilet Transfer Details (indicate cue type and reason): unable to void         Functional mobility during ADLs: Moderate assistance;Rolling walker General ADL Comments: pt completed basic transfer with mod cues     Vision Baseline Vision/History: Wears glasses Wears Glasses: At all times       Perception     Praxis      Pertinent Vitals/Pain Pain Assessment: Faces Faces Pain Scale: Hurts a little bit Pain Location: RLE with weight bearing Pain Descriptors / Indicators: Grimacing;Guarding Pain Intervention(s): Monitored during session;Premedicated before session;Repositioned     Hand Dominance Right   Extremity/Trunk Assessment Upper Extremity Assessment Upper Extremity Assessment: Overall WFL for tasks assessed   Lower Extremity Assessment Lower Extremity Assessment: RLE deficits/detail RLE Deficits / Details: unable to lift her leg off bed nor to progress with ambulation, not sure if  was prior to stroke as had direct anterior THA on 9/26       Communication Communication Communication: Expressive difficulties   Cognition Arousal/Alertness: Awake/alert Behavior During Therapy: [facility] for tasks assessed/performed Overall Cognitive Status: Impaired/Different from baseline Area of Impairment:  Following commands;Safety/judgement;Awareness;Problem solving                   Current Attention Level: Sustained Memory: Decreased recall of precautions;Decreased short-term memory Following Commands: Follows one step commands inconsistently;Follows one step commands with increased time Safety/Judgement: Decreased awareness of safety;Decreased awareness of deficits   Problem Solving: Slow processing;Difficulty sequencing;Requires verbal cues;Requires tactile cues General Comments: needs both verbal, tactile and gestures for command following due to aphasia   General Comments  neice from [place] in the room and reports plans for SNF, hopes to talk to social worker; patient independently asking to brush her teeth, but unable to state her name unless parrotted and same with calling name of her neice    Exercises     Shoulder Instructions      Home Living Family/patient expects to be discharged to:: Skilled nursing facility Living Arrangements: Alone Available Help at Discharge: Family Type of Home: House Home Access: Stairs to enter [employer] of Steps: 4 steps in the front, 2 steps in the back; talking about building a ramp soon Entrance Stairs-Rails: Can reach both Home Layout: One level     Bathroom Shower/Tub: Chief Strategy Officer: Standard     Home Equipment: Production assistant, radio - single point   Additional Comments: niece ( via marriage) currently present for patient to help with decisions due to POA having x2 other family members in hospital out of state. Pt cousin Skie Vitrano requesting to talk to transitional care prior to Saturday (she is leaving to go back to [place]) to help set up d/c care      Prior Functioning/Environment Level of Independence: Independent        Comments: neice here from [place] and was planning to take pt home to work out care after her hip fracture, but interested in SNF since stroke.        OT Problem List:  Decreased strength;Decreased activity tolerance;Impaired balance (sitting and/or standing);Decreased knowledge of use of DME or AE;Decreased safety awareness;Decreased cognition;Decreased knowledge of precautions      OT Treatment/Interventions: Self-care/ADL training;Therapeutic exercise;Neuromuscular education;Energy conservation;DME and/or AE instruction;Manual therapy;Therapeutic activities;Cognitive remediation/compensation;Patient/family education;Balance training    OT Goals(Current goals can be found in the care plan section) Acute Rehab OT Goals Patient Stated Goal: for SNF rehab OT Goal Formulation: With family Time For Goal Achievement: 02/14/20 Potential to Achieve Goals: Good  OT Frequency: Min 2X/week   Barriers to D/C:            Co-evaluation              AM-PAC OT "6 Clicks" Daily Activity     Outcome Measure Help from another person eating meals?: A Little Help from another person taking care of personal grooming?: A Lot Help from another person toileting, which includes using toliet, bedpan, or urinal?: A Lot Help from another person bathing (including washing, rinsing, drying)?: A Lot Help from another person to put on and taking off regular upper body clothing?: A Lot Help from another person to put on and taking off regular lower body clothing?: A Lot 6 Click Score: 13   End of Session Equipment Utilized During Treatment: Gait belt;Rolling walker Nurse Communication: Mobility status;Precautions  Activity Tolerance:  Patient tolerated treatment well Patient left: in chair;with call bell/phone within reach;with chair alarm set;with family/visitor present  OT Visit Diagnosis: Unsteadiness on feet (R26.81);Muscle weakness (generalized) (M62.81)                Time: 0175-1025 OT Time Calculation (min): 18 min Charges:  OT General Charges $OT Visit: 1 Visit OT Evaluation $OT Eval Moderate Complexity: 1 Mod   Brynn, OTR/L  Acute Rehabilitation  Services Pager: [phone] Office: [phone] .   Mateo Flow 01/31/2020, 4:33 PM

## 2020-02-01 ENCOUNTER — Inpatient Hospital Stay (HOSPITAL_COMMUNITY): Payer: Medicare Other

## 2020-02-01 DIAGNOSIS — I639 Cerebral infarction, unspecified: Secondary | ICD-10-CM

## 2020-02-01 NOTE — Progress Notes (Signed)
PROGRESS NOTE    Tracey Holmes  WUJ:[id number]  DOB: Aug 11, 1933  DOA: 01/26/2020 PCP: Laurann Montana, MD Outpatient Specialists:   Hospital course:  86 year old female with HTN, cystic kidney disease, AAA and history of kidney stone was admitted 01/26/2020 with hip fracture after mechanical fall.  Noted to have a comminuted impacted subcapital right femoral fracture.  She underwent surgery 01/28/2020.  Course has been complicated by acute onset of expressive aphasia with code stroke being called.  CT shows right middle frontal gyrus cortical infarct.   Subjective:  Patient is frustrated.  Continues to have expressive aphasia.  She does not seem to have any problems with comprehension.  She is able to answer yes/no questions appropriately.  She is able to say okay appropriately.  This is somewhat improved since yesterday.  She denies having any pain.  She denies being uncomfortable.  She does admit to being frustrated.  She states she has no family.  Objective: Vitals:   01/31/20 2321 02/01/20 0324 02/01/20 0700 02/01/20 1100  BP: (!) 107/57 120/60 (!) 142/66 (!) 149/77  Pulse: 84 80 89 92  Resp: Temp: 98.3 F (36.8 C) 97.9 F (36.6 C) 98.8 F (37.1 C) 97.9 F (36.6 C)  TempSrc: Oral  Oral Oral  SpO2: 95% 98% 99% 98%  Weight:  55 kg    Height:        Intake/Output Summary (Last 24 hours) at 02/01/2020 1514 Last data filed at 02/01/2020 0900 Gross per 24 hour  Intake 750 ml  Output 400 ml  Net 350 ml   Filed Weights   01/29/20 0449 01/31/20 0340 02/01/20 0324  Weight: 54.4 kg 55.1 kg 55 kg     Exam:  General: Elderly patient lying in bed in no acute distress having difficulty expressing herself. Eyes: sclera anicteric, conjuctiva mild injection bilaterally CVS: S1-S2, regular  Respiratory:  decreased air entry bilaterally secondary to decreased inspiratory effort, rales at bases  GI: NABS, soft, NT  LE: CDI Neuro: Clear expressive aphasia      Assessment & Plan:   Left hip fracture Status post right hip arthroplasty 01/28/2020 Being followed by PT and OT After discussion with family, plan is for discharge home with home health services.  Acute stroke Very much appreciate neurology consultation Bubble study on TTE is positive for what appears to be a large right to left shunt DVT study is negative It is very possible that patient had a fat embolus after/during hip arthroplasty causing stroke MRI shows acute multifocal infarcts bilaterally PT OT and speech are following--patient will need inpatient rehab at a place where she can get aggressive speech therapy as well as PT for her hip.  TOC consult placed. Permissive hypertension for now, neurology recommends adding back antihypertensives over 5 to 7 days  Continue telemetry monitoring Patient is on aspirin 81 mg daily  Right to left shunt seen on bubble study It might be helpful to ask cardiology to evaluate patient and clinical significance of shunt She is however asymptomatic  HTN Permissive hypertension  HL Maximize statins    DVT prophylaxis: Enoxaparin Code Status: Full Family Communication: Attempt to contact family Disposition Plan:   Patient is from: Home  Anticipated Discharge Location: Home with home health per patient and family preference  Barriers to Discharge: Acute stroke  Is patient medically stable for Discharge: No   Consultants:  Orthopedics  Neurology  Procedures:  Right hip arthroplasty 01/28/2020  TTE with positive bubble  study  Antimicrobials:  None   Data Reviewed:  Basic Metabolic Panel: Recent Labs  Lab 01/27/20 0621 01/28/20 0155 01/29/20 1444 01/30/20 0437 01/31/20 0656  NA 138 136 139 138 139  K 4.3 4.2 3.7 3.7 3.7  CL 103 106 109 107 110  CO2 23 21* 20* 21* 17*  GLUCOSE 140* 114* 207* 112* 119*  BUN 13 15 26* 23 17  CREATININE 0.61 0.81 0.98 0.70 0.56  CALCIUM 8.6* 8.3* 7.9* 7.7* 7.8*  MG 2.1  --    --   --   --    Liver Function Tests: Recent Labs  Lab 01/27/20 0621 01/31/20 0656  AST 25 32  ALT 23 24  ALKPHOS 44 52  BILITOT 1.1 1.2  PROT 6.9 5.2*  ALBUMIN 4.0 2.7*   No results for input(s): LIPASE, AMYLASE in the last 168 hours. No results for input(s): AMMONIA in the last 168 hours. CBC: Recent Labs  Lab 01/26/20 1908 01/26/20 1908 01/27/20 0621 01/28/20 0155 01/29/20 1444 01/30/20 0437 01/31/20 0656  WBC 14.7*   < > 12.7* 11.4* 10.8* 10.2 9.9  NEUTROABS 12.1*  --  9.5* 8.0*  --   --   --   HGB 14.8   < > 15.3* 14.1 9.9* 8.7* 9.2*  HCT 46.0   < > 46.4* 43.7 30.9* 26.9* 28.7*  MCV 98.9   < > 96.3 96.9 100.3* 97.8 101.1*  PLT 243   < > 237 220 192 213 223   < > = values in this interval not displayed.   Cardiac Enzymes: No results for input(s): CKTOTAL, CKMB, CKMBINDEX, TROPONINI in the last 168 hours. BNP (last 3 results) No results for input(s): PROBNP in the last 8760 hours. CBG: Recent Labs  Lab 01/30/20 0959  GLUCAP 92    Recent Results (from the past 240 hour(s))  Respiratory Panel by RT PCR (Flu A&B, Covid) - Nasopharyngeal Swab     Status: None   Collection Time: 01/26/20 10:50 PM   Specimen: Nasopharyngeal Swab  Result Value Ref Range Status   SARS Coronavirus 2 by RT PCR NEGATIVE NEGATIVE Final    Comment: (NOTE) SARS-CoV-2 target nucleic acids are NOT DETECTED.  The SARS-CoV-2 RNA is generally detectable in upper respiratoy specimens during the acute phase of infection. The lowest concentration of SARS-CoV-2 viral copies this assay can detect is 131 copies/mL. A negative result does not preclude SARS-Cov-2 infection and should not be used as the sole basis for treatment or other patient management decisions. A negative result may occur with  improper specimen collection/handling, submission of specimen other than nasopharyngeal swab, presence of viral mutation(s) within the areas targeted by this assay, and inadequate number of viral  copies (<131 copies/mL). A negative result must be combined with clinical observations, patient history, and epidemiological information. The expected result is Negative.  Fact Sheet for Patients:  https://www.moore.com/  Fact Sheet for Healthcare Providers:  https://www.young.biz/  This test is no t yet approved or cleared by the [place] FDA and  has been authorized for detection and/or diagnosis of SARS-CoV-2 by FDA under an Emergency Use Authorization (EUA). This EUA will remain  in effect (meaning this test can be used) for the duration of the COVID-19 declaration under Section 564(b)(1) of the Act, 21 U.S.C. section 360bbb-3(b)(1), unless the authorization is terminated or revoked sooner.     Influenza A by PCR NEGATIVE NEGATIVE Final   Influenza B by PCR NEGATIVE NEGATIVE Final    Comment: (NOTE) The  Xpert Xpress SARS-CoV-2/FLU/RSV assay is intended as an aid in  the diagnosis of influenza from Nasopharyngeal swab specimens and  should not be used as a sole basis for treatment. Nasal washings and  aspirates are unacceptable for Xpert Xpress SARS-CoV-2/FLU/RSV  testing.  Fact Sheet for Patients: https://www.moore.com/  Fact Sheet for Healthcare Providers: https://www.young.biz/  This test is not yet approved or cleared by the [place] FDA and  has been authorized for detection and/or diagnosis of SARS-CoV-2 by  FDA under an Emergency Use Authorization (EUA). This EUA will remain  in effect (meaning this test can be used) for the duration of the  Covid-19 declaration under Section 564(b)(1) of the Act, 21  U.S.C. section 360bbb-3(b)(1), unless the authorization is  terminated or revoked. Performed at [facility], [address], [place], [place] [address]   Surgical pcr screen     Status: None   Collection Time: 01/27/20  5:25 PM   Specimen: Nasal Mucosa; Nasal Swab  Result Value  Ref Range Status   MRSA, PCR NEGATIVE NEGATIVE Final   Staphylococcus aureus NEGATIVE NEGATIVE Final    Comment: (NOTE) The Xpert SA Assay (FDA approved for NASAL specimens in patients 26 years of age and older), is one component of a comprehensive surveillance program. It is not intended to diagnose infection nor to guide or monitor treatment. Performed at [facility], [address] [place], [place] [address]       Studies: DG Abd 1 View  Result Date: 01/30/2020 CLINICAL DATA:  Foreign body EXAM: ABDOMEN - 1 VIEW COMPARISON:  None. FINDINGS: Contrast material in the urinary bladder. Nonobstructed bowel-gas pattern. No radiopaque foreign body over the abdomen. Status post right hip replacement. IMPRESSION: Nonobstructed bowel-gas pattern. Electronically Signed   By: Jasmine Pang M.D.   On: 01/30/2020 20:07   MR BRAIN WO CONTRAST  Result Date: 01/30/2020 CLINICAL DATA:  Stroke follow-up EXAM: MRI HEAD WITHOUT CONTRAST TECHNIQUE: Multiplanar, multiecho pulse sequences of the brain and surrounding structures were obtained without intravenous contrast. COMPARISON:  Head CT 01/30/2020 FINDINGS: Brain: Multifocal abnormal diffusion restriction within both hemispheres. The largest area is in the left frontal operculum. This is in a location in keeping with aphasia. The largest contralateral abnormality is at the middle frontal gyrus. There is petechial hemorrhage within both frontal opercula. Multifocal hyperintense T2-weighted signal within the white matter. There is generalized atrophy without lobar predilection. Old small vessel infarct of the right cerebellum. No chronic microhemorrhage. Normal midline structures. Vascular: Normal flow voids Skull and upper cervical spine: Normal Sinuses/Orbits: Bilateral lens replacements. Paranasal sinuses are clear. No mastoid effusion. Normal nasopharynx. Other: None IMPRESSION: 1. Multifocal acute ischemia within both hemispheres, left worse than  right. The largest infarct, involving the left frontal operculum, is in keeping with reported aphasia. 2. Petechial hemorrhage within both frontal opercula. No mass effect. (Heidelberg classification 1b: HI2, confluent petechiae, no mass effect.) Electronically Signed   By: Deatra Robinson M.D.   On: 01/30/2020 19:03   VAS [place] TRANSCRANIAL DOPPLER W BUBBLES  Result Date: 02/01/2020  Transcranial Doppler with Bubble Indications: Stroke. Comparison Study: No prior studies. Performing Technologist: Chanda Busing RVT  Examination Guidelines: A complete evaluation includes B-mode imaging, spectral Doppler, color Doppler, and power Doppler as needed of all accessible portions of each vessel. Bilateral testing is considered an integral part of a complete examination. Limited examinations for reoccurring indications may be performed as noted.  Summary:  A vascular evaluation was performed. The right middle cerebral  artery was studied. An IV was inserted into the patient's right Cephalic. Verbal informed consent was obtained.  Curtain effect was noted which is indicative of a large PFO. *See table(s) above for TCD measurements and observations.    Preliminary    ECHOCARDIOGRAM COMPLETE  Result Date: 01/31/2020    ECHOCARDIOGRAM REPORT   Patient Name:   Tracey Holmes Date of Exam: 01/31/2020 Medical Rec #:  [id number]         Height:       63.0 in Accession #:    [id number]        Weight:       121.5 lb Date of Birth:  1933/12/26         BSA:          1.564 m Patient Age:    86 years          BP:           132/72 mmHg Patient Gender: F                 HR:           99 bpm. Exam Location:  Inpatient Procedure: 2D Echo, Cardiac Doppler and Color Doppler Indications:    Stroke  History:        Patient has no prior history of Echocardiogram examinations.                 Risk Factors:Hypertension and Dyslipidemia. Thoracic aortic                 aneurysm.  Sonographer:    Sheralyn Boatman RDCS Referring Phys: [id number] STEPHEN K CHIU   Sonographer Comments: Technically difficult study due to poor echo windows. IMPRESSIONS  1. There is acceleration of flow through left ventricular outflow tract. Non obstructive. Left ventricular ejection fraction, by estimation, is 65 to 70%. The left ventricle has normal function. The left ventricle has no regional wall motion abnormalities. Left ventricular diastolic parameters are consistent with Grade I diastolic dysfunction (impaired relaxation).  2. Right ventricular systolic function is normal. The right ventricular size is normal.  3. The mitral valve is normal in structure. No evidence of mitral valve regurgitation. No evidence of mitral stenosis.  4. The aortic valve is normal in structure. Aortic valve regurgitation is not visualized. No aortic stenosis is present.  5. Aortic dilatation noted. There is mild dilatation of the ascending aorta, measuring 40 mm.  6. The inferior vena cava is normal in size with greater than 50% respiratory variability, suggesting right atrial pressure of 3 mmHg. Conclusion(s)/Recommendation(s): No intracardiac source of embolism detected on this transthoracic study. A transesophageal echocardiogram is recommended to exclude cardiac source of embolism if clinically indicated. FINDINGS  Left Ventricle: There is acceleration of flow through left ventricular outflow tract. Non obstructive. Left ventricular ejection fraction, by estimation, is 65 to 70%. The left ventricle has normal function. The left ventricle has no regional wall motion abnormalities. The left ventricular internal cavity size was normal in size. There is no left ventricular hypertrophy. Left ventricular diastolic parameters are consistent with Grade I diastolic dysfunction (impaired relaxation). Right Ventricle: The right ventricular size is normal. No increase in right ventricular wall thickness. Right ventricular systolic function is normal. Left Atrium: Left atrial size was normal in size. Right Atrium:  Right atrial size was normal in size. Pericardium: There is no evidence of pericardial effusion. Mitral Valve: The mitral valve is normal in structure. No evidence of mitral valve  regurgitation. No evidence of mitral valve stenosis. MV peak gradient, 14.4 mmHg. The mean mitral valve gradient is 3.0 mmHg. Tricuspid Valve: The tricuspid valve is normal in structure. Tricuspid valve regurgitation is trivial. No evidence of tricuspid stenosis. Aortic Valve: The aortic valve is normal in structure. Aortic valve regurgitation is not visualized. No aortic stenosis is present. Pulmonic Valve: The pulmonic valve was normal in structure. Pulmonic valve regurgitation is not visualized. No evidence of pulmonic stenosis. Aorta: Aortic dilatation noted. There is mild dilatation of the ascending aorta, measuring 40 mm. Venous: The inferior vena cava is normal in size with greater than 50% respiratory variability, suggesting right atrial pressure of 3 mmHg. IAS/Shunts: No atrial level shunt detected by color flow Doppler.  LEFT VENTRICLE PLAX 2D LVIDd:         2.90 cm     Diastology LVIDs:         2.10 cm     LV e' medial:    9.06 cm/s LV PW:         1.10 cm     LV E/e' medial:  10.8 LV IVS:        1.10 cm     LV e' lateral:   10.90 cm/s LVOT diam:     1.80 cm     LV E/e' lateral: 9.0 LV SV:         56 LV SV Index:   36 LVOT Area:     2.54 cm  LV Volumes (MOD) LV vol d, MOD A2C: 51.0 ml LV vol d, MOD A4C: 40.2 ml LV vol s, MOD A2C: 17.2 ml LV vol s, MOD A4C: 16.5 ml LV SV MOD A2C:     33.8 ml LV SV MOD A4C:     40.2 ml LV SV MOD BP:      30.0 ml RIGHT VENTRICLE             IVC RV S prime:     13.20 cm/s  IVC diam: 1.60 cm TAPSE (M-mode): 1.9 cm LEFT ATRIUM             Index       RIGHT ATRIUM          Index LA diam:        2.30 cm 1.47 cm/m  RA Area:     8.57 cm LA Vol (A2C):   18.2 ml 11.63 ml/m RA Volume:   15.70 ml 10.04 ml/m LA Vol (A4C):   23.8 ml 15.21 ml/m LA Biplane Vol: 21.8 ml 13.94 ml/m  AORTIC VALVE LVOT Vmax:    137.00 cm/s LVOT Vmean:  83.900 cm/s LVOT VTI:    0.221 m  AORTA Ao Root diam: 2.80 cm Ao Asc diam:  4.05 cm Ao Arch diam: 4.0 cm MITRAL VALVE MV Area (PHT): 3.31 cm     SHUNTS MV Peak grad:  14.4 mmHg    Systemic VTI:  0.22 m MV Mean grad:  3.0 mmHg     Systemic Diam: 1.80 cm MV Vmax:       1.90 m/s MV Vmean:      73.2 cm/s MV Decel Time: 229 msec MV E velocity: 98.10 cm/s MV A velocity: 157.00 cm/s MV E/A ratio:  0.62 Donato SchultzMark Skains MD Electronically signed by Donato SchultzMark Skains MD Signature Date/Time: 01/31/2020/10:40:23 AM    Final    VAS US LOWER EXTREMITY VENOUS (DVT)  Result Date: 02/01/2020  Lower Venous DVTStudy Indications: Stroke, and PFO.  Risk Factors: None identified.  Limitations: Poor ultrasound/tissue interface and patient positioning. Comparison Study: No prior studies. Performing Technologist: Chanda Busing RVT  Examination Guidelines: A complete evaluation includes B-mode imaging, spectral Doppler, color Doppler, and power Doppler as needed of all accessible portions of each vessel. Bilateral testing is considered an integral part of a complete examination. Limited examinations for reoccurring indications may be performed as noted. The reflux portion of the exam is performed with the patient in reverse Trendelenburg.  +---------+---------------+---------+-----------+----------+--------------+ RIGHT    CompressibilityPhasicitySpontaneityPropertiesThrombus Aging +---------+---------------+---------+-----------+----------+--------------+ CFV      Full           Yes      Yes                                 +---------+---------------+---------+-----------+----------+--------------+ SFJ      Full                                                        +---------+---------------+---------+-----------+----------+--------------+ FV Prox  Full                                                        +---------+---------------+---------+-----------+----------+--------------+ FV Mid    Full                                                        +---------+---------------+---------+-----------+----------+--------------+ FV DistalFull                                                        +---------+---------------+---------+-----------+----------+--------------+ PFV      Full                                                        +---------+---------------+---------+-----------+----------+--------------+ POP      Full           Yes      Yes                                 +---------+---------------+---------+-----------+----------+--------------+ PTV      Full                                                        +---------+---------------+---------+-----------+----------+--------------+ PERO     Full                                                        +---------+---------------+---------+-----------+----------+--------------+   +---------+---------------+---------+-----------+----------+--------------+  LEFT     CompressibilityPhasicitySpontaneityPropertiesThrombus Aging +---------+---------------+---------+-----------+----------+--------------+ CFV      Full           Yes      Yes                                 +---------+---------------+---------+-----------+----------+--------------+ SFJ      Full                                                        +---------+---------------+---------+-----------+----------+--------------+ FV Prox  Full                                                        +---------+---------------+---------+-----------+----------+--------------+ FV Mid   Full                                                        +---------+---------------+---------+-----------+----------+--------------+ FV DistalFull                                                        +---------+---------------+---------+-----------+----------+--------------+ PFV      Full                                                         +---------+---------------+---------+-----------+----------+--------------+ POP      Full           Yes      Yes                                 +---------+---------------+---------+-----------+----------+--------------+ PTV      Full                                                        +---------+---------------+---------+-----------+----------+--------------+ PERO                                                  Not visualized +---------+---------------+---------+-----------+----------+--------------+     Summary: RIGHT: - There is no evidence of deep vein thrombosis in the lower extremity.  - No cystic structure found in the popliteal fossa.  LEFT: - There is no evidence of deep vein thrombosis in the lower extremity. However, portions of this examination were limited- see technologist comments above.  -  No cystic structure found in the popliteal fossa.  *See table(s) above for measurements and observations. Electronically signed by Sherald Hess MD on 02/01/2020 at 2:28:31 PM.    Final      Scheduled Meds: . aspirin EC  81 mg Oral Daily  . brimonidine  1 drop Both Eyes TID  . docusate sodium  100 mg Oral BID  . enoxaparin (LOVENOX) injection  40 mg Subcutaneous Q24H  . pravastatin  40 mg Oral Daily   Continuous Infusions:  Active Problems:   Closed right hip fracture (HCC)   Aphasia   Acute embolic stroke (HCC)     Jakeia Carreras Orma Flaming, [facility]  If 7PM-7AM, please contact night-coverage [email or web address] Password [email or web address] 02/01/2020, 3:14 PM    LOS: 6 days

## 2020-02-01 NOTE — Progress Notes (Signed)
TCD bubble and bilateral lower extremity venous duplex has been completed. Preliminary results can be found in CV Proc through chart review.   02/01/20 1:46 PM Olen Cordial RVT

## 2020-02-01 NOTE — Progress Notes (Signed)
Physical Therapy Treatment Patient Details Name: Tracey Holmes MRN: [id number] DOB: [birth year] Today's Date: 02/01/2020    History of Present Illness 86yo female who reports a fall at home, found to have comminuted impacted subcapital R femoral fracture. Received R anterior approach THA  01/28/20.  Noted change in status with new onset aphasia 01/30/20, MRI showing Multifocal acute ischemia within both hemispheres, left worse than right.   PMH HTN, HLD, thoracic aneurysm    PT Comments    Patient with incontinence of stool on arrival (apparently unaware). Rolling left and right for cleaning prior to exercises and OOB. Patient overall requiring +1 mod assist for transfers and gait with RW up to 14 feet. ? If pt had pre-existing leg length discrepancy or new since surgery. She replies yes when asked if prior to surgery, and yes that she had a lift shoe for her LLE (?accuracy).     Follow Up Recommendations  SNF;Supervision/Assistance - 24 hour     Equipment Recommendations  Rolling walker with 5" wheels;3in1 (PT)    Recommendations for Other Services       Precautions / Restrictions Precautions Precautions: Fall Precaution Comments: no hip precautions, direct anterior Restrictions RLE Weight Bearing: Weight bearing as tolerated    Mobility  Bed Mobility Overal bed mobility: Needs Assistance Bed Mobility: Rolling;Sidelying to Sit Rolling: Min assist (with rail, rt/lt) Sidelying to sit: Mod assist Supine to sit: Mod assist     General bed mobility comments: assist to lift trunk and scoot to EOB  Transfers Overall transfer level: Needs assistance Equipment used: Rolling walker (2 wheeled) Transfers: Sit to/from Stand Sit to Stand: Min assist;Mod assist         General transfer comment: cues to push up from furniture; mod assist from bed and min assist from recliner  Ambulation/Gait Ambulation/Gait assistance: Mod assist;+2 safety/equipment (progressed to +1  mod) Gait Distance (Feet): 3 Feet (seated rest; 14 feet) Assistive device: Rolling walker (2 wheeled) Gait Pattern/deviations: Step-to pattern;Decreased step length - right;Steppage;Wide base of support (RLE appears much longer than LLE, using steppage)     General Gait Details: assist to progress R LE ~90% of steps; RLE appears longer than LLE with steppage gait with RLE (requires assist) and then appears to vault over RLE as advancing LLE; pt not taking time to stop for R LE progression at times unsafely moving forward prior to assist to move the leg.   Stairs             Wheelchair Mobility    Modified Rankin (Stroke Patients Only) Modified Rankin (Stroke Patients Only) Pre-Morbid Rankin Score: Moderately severe disability Modified Rankin: Moderately severe disability     Balance Overall balance assessment: Needs assistance Sitting-balance support: Feet supported Sitting balance-Leahy Scale: Good     Standing balance support: Bilateral upper extremity supported Standing balance-Leahy Scale: Poor Standing balance comment: UE support for balance                            Cognition Arousal/Alertness: Awake/alert Behavior During Therapy: WFL for tasks assessed/performed Overall Cognitive Status: Difficult to assess Area of Impairment: Following commands;Safety/judgement;Awareness;Problem solving                   Current Attention Level: Sustained   Following Commands: Follows one step commands with increased time     Problem Solving: Slow processing;Difficulty sequencing;Requires verbal cues;Requires tactile cues General Comments: +expressive aphasia (asked her name and she  states her birthday) answers biographical yes/no questions correctly 100%      Exercises General Exercises - Lower Extremity Heel Slides: AAROM;Right;5 reps (prior to sitting EOB)    General Comments        Pertinent Vitals/Pain Pain Assessment: Faces Faces Pain  Scale: Hurts little more (with rolling onto left side, less pain rolling onto rt) Pain Location: RLE Pain Descriptors / Indicators: Grimacing;Guarding Pain Intervention(s): Limited activity within patient's tolerance;Monitored during session;Repositioned    Home Living                      Prior Function            PT Goals (current goals can now be found in the care plan section) Acute Rehab PT Goals Patient Stated Goal: for SNF rehab Time For Goal Achievement: 02/14/20 Potential to Achieve Goals: Fair Progress towards PT goals: Progressing toward goals    Frequency    Min 3X/week      PT Plan Current plan remains appropriate    Co-evaluation              AM-PAC PT "6 Clicks" Mobility   Outcome Measure  Help needed turning from your back to your side while in a flat bed without using bedrails?: A Lot Help needed moving from lying on your back to sitting on the side of a flat bed without using bedrails?: A Lot Help needed moving to and from a bed to a chair (including a wheelchair)?: A Lot Help needed standing up from a chair using your arms (e.g., wheelchair or bedside chair)?: A Little Help needed to walk in hospital room?: A Lot Help needed climbing 3-5 steps with a railing? : Total 6 Click Score: 12    End of Session Equipment Utilized During Treatment: Gait belt Activity Tolerance: Patient tolerated treatment well Patient left: in chair;with call bell/phone within reach;with chair alarm set Nurse Communication: Mobility status;Weight bearing status PT Visit Diagnosis: History of falling (Z91.81);Muscle weakness (generalized) (M62.81);Other symptoms and signs involving the nervous system (R29.898);Other abnormalities of gait and mobility (R26.89)     Time: 3235-5732 PT Time Calculation (min) (ACUTE ONLY): 23 min  Charges:  $Gait Training: 23-37 mins                      Jerolyn Center, PT Pager [phone]    Zena Amos 02/01/2020, 6:17  PM

## 2020-02-01 NOTE — Progress Notes (Signed)
STROKE TEAM PROGRESS NOTE   INTERVAL HISTORY Her niece Fannie KneeSue is at the bedside.  She continues to have significant aphasia and expressive language difficulties.  Neurological exam is unchanged.  Vital signs are stable.  Transcranial Doppler bubble study was performed at the bedside and was strongly positive indicative of a large right-to-left shunt.  Lower extremity venous Dopplers were also obtained which preliminary report shows no evidence of DVT. Vitals:   01/31/20 1622 01/31/20 1956 01/31/20 2321 02/01/20 0324  BP: (!) 123/57 138/73 (!) 107/57 120/60  Pulse: (!) 107 (!) 104 84 80  Resp: 18 18 18 14   Temp: 97.8 F (36.6 C) 99 F (37.2 C) 98.3 F (36.8 C) 97.9 F (36.6 C)  TempSrc: Oral Oral Oral   SpO2: 96% 97% 95% 98%  Weight:    55 kg  Height:       CBC:  Recent Labs  Lab 01/27/20 0621 01/27/20 0621 01/28/20 0155 01/29/20 1444 01/30/20 0437 01/31/20 0656  WBC 12.7*   < > 11.4*   < > 10.2 9.9  NEUTROABS 9.5*  --  8.0*  --   --   --   HGB 15.3*   < > 14.1   < > 8.7* 9.2*  HCT 46.4*   < > 43.7   < > 26.9* 28.7*  MCV 96.3   < > 96.9   < > 97.8 101.1*  PLT 237   < > 220   < > 213 223   < > = values in this interval not displayed.   Basic Metabolic Panel:  Recent Labs  Lab 01/27/20 0621 01/28/20 0155 01/30/20 0437 01/31/20 0656  NA 138   < > 138 139  K 4.3   < > 3.7 3.7  CL 103   < > 107 110  CO2 23   < > 21* 17*  GLUCOSE 140*   < > 112* 119*  BUN 13   < > 23 17  CREATININE 0.61   < > 0.70 0.56  CALCIUM 8.6*   < > 7.7* 7.8*  MG 2.1  --   --   --    < > = values in this interval not displayed.   Lipid Panel:  Recent Labs  Lab 01/31/20 0656  CHOL 135  TRIG 84  HDL 45  CHOLHDL 3.0  VLDL 17  LDLCALC 73   HgbA1c:  Recent Labs  Lab 01/30/20 1432  HGBA1C 5.6   Urine Drug Screen: No results for input(s): LABOPIA, COCAINSCRNUR, LABBENZ, AMPHETMU, THCU, LABBARB in the last 168 hours.  Alcohol Level No results for input(s): ETH in the last 168  hours.  IMAGING past 24 hours ECHOCARDIOGRAM COMPLETE  Result Date: 01/31/2020    ECHOCARDIOGRAM REPORT   Patient Name:   Erle CrockerSHIRLEY A Schweiss Date of Exam: 01/31/2020 Medical Rec #:  [id number]         Height:       63.0 in Accession #:    [id number]        Weight:       121.5 lb Date of Birth:  07/22/1933         BSA:          1.564 m Patient Age:    86 years          BP:           132/72 mmHg Patient Gender: F  HR:           99 bpm. Exam Location:  Inpatient Procedure: 2D Echo, Cardiac Doppler and Color Doppler Indications:    Stroke  History:        Patient has no prior history of Echocardiogram examinations.                 Risk Factors:Hypertension and Dyslipidemia. Thoracic aortic                 aneurysm.  Sonographer:    Sheralyn Boatman RDCS Referring Phys: [id number] STEPHEN K CHIU  Sonographer Comments: Technically difficult study due to poor echo windows. IMPRESSIONS  1. There is acceleration of flow through left ventricular outflow tract. Non obstructive. Left ventricular ejection fraction, by estimation, is 65 to 70%. The left ventricle has normal function. The left ventricle has no regional wall motion abnormalities. Left ventricular diastolic parameters are consistent with Grade I diastolic dysfunction (impaired relaxation).  2. Right ventricular systolic function is normal. The right ventricular size is normal.  3. The mitral valve is normal in structure. No evidence of mitral valve regurgitation. No evidence of mitral stenosis.  4. The aortic valve is normal in structure. Aortic valve regurgitation is not visualized. No aortic stenosis is present.  5. Aortic dilatation noted. There is mild dilatation of the ascending aorta, measuring 40 mm.  6. The inferior vena cava is normal in size with greater than 50% respiratory variability, suggesting right atrial pressure of 3 mmHg. Conclusion(s)/Recommendation(s): No intracardiac source of embolism detected on this transthoracic study. A transesophageal  echocardiogram is recommended to exclude cardiac source of embolism if clinically indicated. FINDINGS  Left Ventricle: There is acceleration of flow through left ventricular outflow tract. Non obstructive. Left ventricular ejection fraction, by estimation, is 65 to 70%. The left ventricle has normal function. The left ventricle has no regional wall motion abnormalities. The left ventricular internal cavity size was normal in size. There is no left ventricular hypertrophy. Left ventricular diastolic parameters are consistent with Grade I diastolic dysfunction (impaired relaxation). Right Ventricle: The right ventricular size is normal. No increase in right ventricular wall thickness. Right ventricular systolic function is normal. Left Atrium: Left atrial size was normal in size. Right Atrium: Right atrial size was normal in size. Pericardium: There is no evidence of pericardial effusion. Mitral Valve: The mitral valve is normal in structure. No evidence of mitral valve regurgitation. No evidence of mitral valve stenosis. MV peak gradient, 14.4 mmHg. The mean mitral valve gradient is 3.0 mmHg. Tricuspid Valve: The tricuspid valve is normal in structure. Tricuspid valve regurgitation is trivial. No evidence of tricuspid stenosis. Aortic Valve: The aortic valve is normal in structure. Aortic valve regurgitation is not visualized. No aortic stenosis is present. Pulmonic Valve: The pulmonic valve was normal in structure. Pulmonic valve regurgitation is not visualized. No evidence of pulmonic stenosis. Aorta: Aortic dilatation noted. There is mild dilatation of the ascending aorta, measuring 40 mm. Venous: The inferior vena cava is normal in size with greater than 50% respiratory variability, suggesting right atrial pressure of 3 mmHg. IAS/Shunts: No atrial level shunt detected by color flow Doppler.  LEFT VENTRICLE PLAX 2D LVIDd:         2.90 cm     Diastology LVIDs:         2.10 cm     LV e' medial:    9.06 cm/s LV PW:          1.10 cm  LV E/e' medial:  10.8 LV IVS:        1.10 cm     LV e' lateral:   10.90 cm/s LVOT diam:     1.80 cm     LV E/e' lateral: 9.0 LV SV:         56 LV SV Index:   36 LVOT Area:     2.54 cm  LV Volumes (MOD) LV vol d, MOD A2C: 51.0 ml LV vol d, MOD A4C: 40.2 ml LV vol s, MOD A2C: 17.2 ml LV vol s, MOD A4C: 16.5 ml LV SV MOD A2C:     33.8 ml LV SV MOD A4C:     40.2 ml LV SV MOD BP:      30.0 ml RIGHT VENTRICLE             IVC RV S prime:     13.20 cm/s  IVC diam: 1.60 cm TAPSE (M-mode): 1.9 cm LEFT ATRIUM             Index       RIGHT ATRIUM          Index LA diam:        2.30 cm 1.47 cm/m  RA Area:     8.57 cm LA Vol (A2C):   18.2 ml 11.63 ml/m RA Volume:   15.70 ml 10.04 ml/m LA Vol (A4C):   23.8 ml 15.21 ml/m LA Biplane Vol: 21.8 ml 13.94 ml/m  AORTIC VALVE LVOT Vmax:   137.00 cm/s LVOT Vmean:  83.900 cm/s LVOT VTI:    0.221 m  AORTA Ao Root diam: 2.80 cm Ao Asc diam:  4.05 cm Ao Arch diam: 4.0 cm MITRAL VALVE MV Area (PHT): 3.31 cm     SHUNTS MV Peak grad:  14.4 mmHg    Systemic VTI:  0.22 m MV Mean grad:  3.0 mmHg     Systemic Diam: 1.80 cm MV Vmax:       1.90 m/s MV Vmean:      73.2 cm/s MV Decel Time: 229 msec MV E velocity: 98.10 cm/s MV A velocity: 157.00 cm/s MV E/A ratio:  0.62 Donato Schultz MD Electronically signed by Donato Schultz MD Signature Date/Time: 01/31/2020/10:40:23 AM    Final     PHYSICAL EXAM Pleasant elderly Caucasian lady not in distress. . Afebrile. Head is nontraumatic. Neck is supple without bruit.    Cardiac exam no murmur or gallop. Lungs are clear to auscultation. Distal pulses are well felt. Neurological Exam ;  Awake  Alert globally aphasic with nonfluent speech and l word finding difficulties.  He appears quite frustrated with her speech difficulties.  Can comprehend simple midline and one-step commands only.[place] movements full without nystagmus.fundi were not visualized. Vision acuity and fields appear normal. Hearing is normal. Palatal movements are normal.  Face symmetric. Tongue midline. Normal strength, tone, reflexes and coordination. Normal sensation. Gait deferred.  ASSESSMENT/PLAN [name] is a 86 y.o. female with history of HTN, HLD, thoracic aortic aneurysm who was admitted following a fall with R femoral fx s/p THR noted to have aphasia.   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  Code Stroke CT head possible R middle frontal gyrus cortical infarct. Tiny age indeterminate R cerebellar infarct. ASPECTS 9    CTA head & neck no LVO. Min atherosclerosis head and neck. Mild fusiform enlargement posterior R MCA.  MRI  L>R multifocal ischemia B hemispheres, largest L frontal operculum. B frontal opercula w/ petechial hemorrhage  LE Doppler negative for DVT  TCD w/ bubble positive for large right-to-left shunt  2D Echo EF 60-70%. No source of embolus   LDL 73  HgbA1c 5.6  VTE prophylaxis - Lovenox 40 mg sq daily   aspirin 81 mg daily prior to admission, now on No antithrombotic. Add aspirin 81 for secondary stroke prevention  Therapy recommendations:  SNF  Disposition:  pending  (lived alone PTA)  Hypertension  Stable . Permissive hypertension (OK if < 220/120) but gradually normalize in 5-7 days . Long-term BP goal normotensive  Hyperlipidemia  Home meds:  pravachol 80  Now on pravachol 40  LDL 73, goal < 70  Recommend Increase pravachol to home dose of 80    Continue statin at discharge  Other Stroke Risk Factors  Advanced age  Other Active Problems  Comminuted impacted subcapital right femoral fracture s/p THR 9/26  Leukocytosis WBC 14.7  Hospital day # 6 She presented with bicerebral embolic infarcts following recent hip surgery.  Recommend continue cardiac monitoring for A. Fib and considered 30-day heart monitor after discharge for paroxysmal A. fib.  Continue aspirin 81 mg daily for stroke  prevention.     Greater than 50% time during the 25-minute visit was spent on counseling and coordination of care about her embolic strokes discussion with care team and answering questions. Stroke team will sign off.  Kindly call for questions.  To contact Stroke Continuity provider, please refer to [email or web address]. After hours, contact General Neurology

## 2020-02-01 NOTE — Progress Notes (Signed)
SLP Cancellation Note  Patient Details Name: Tracey Holmes MRN: [id number] DOB: 1934-01-20   Cancelled treatment:       Reason Eval/Treat Not Completed: Patient at procedure or test/unavailable   Royetta Crochet 02/01/2020, 2:35 PM

## 2020-02-02 ENCOUNTER — Inpatient Hospital Stay (HOSPITAL_COMMUNITY): Payer: Medicare Other

## 2020-02-02 ENCOUNTER — Other Ambulatory Visit: Payer: Self-pay | Admitting: Student

## 2020-02-02 DIAGNOSIS — I639 Cerebral infarction, unspecified: Secondary | ICD-10-CM

## 2020-02-02 LAB — BASIC METABOLIC PANEL
Anion gap: 11 (ref 5–15)
BUN: 22 mg/dL (ref 8–23)
CO2: 21 mmol/L — ABNORMAL LOW (ref 22–32)
Calcium: 7.8 mg/dL — ABNORMAL LOW (ref 8.9–10.3)
Chloride: 107 mmol/L (ref 98–111)
Creatinine, Ser: 0.56 mg/dL (ref 0.44–1.00)
GFR calc Af Amer: >60 mL/min (ref >60)
GFR calc non Af Amer: >60 mL/min (ref >60)
Glucose, Bld: 101 mg/dL — ABNORMAL HIGH (ref 70–99)
Potassium: 3.5 mmol/L (ref 3.5–5.1)
Sodium: 139 mmol/L (ref 135–145)

## 2020-02-02 LAB — CBC
HCT: 26.1 % — ABNORMAL LOW (ref 36.0–46.0)
Hemoglobin: 8.6 g/dL — ABNORMAL LOW (ref 12.0–15.0)
MCH: 32.2 pg (ref 26.0–34.0)
MCHC: 33 g/dL (ref 30.0–36.0)
MCV: 97.8 fL (ref 80.0–100.0)
Platelets: 264 10*3/uL (ref 150–400)
RBC: 2.67 MIL/uL — ABNORMAL LOW (ref 3.87–5.11)
RDW: 13 % (ref 11.5–15.5)
WBC: 11.8 10*3/uL — ABNORMAL HIGH (ref 4.0–10.5)
nRBC: 0.2 % (ref 0.0–0.2)

## 2020-02-02 LAB — RESPIRATORY PANEL BY RT PCR (FLU A&B, COVID)
Influenza A by PCR: NEGATIVE
Influenza B by PCR: NEGATIVE
SARS Coronavirus 2 by RT PCR: NEGATIVE

## 2020-02-02 MED ORDER — DOCUSATE SODIUM 100 MG PO CAPS
100.0000 mg | ORAL_CAPSULE | Freq: Two times a day (BID) | ORAL | 0 refills | Status: DC
Start: 2020-02-02 — End: 2020-05-16

## 2020-02-02 MED ORDER — POLYETHYLENE GLYCOL 3350 17 G PO PACK: 17.0000 g | PACK | Freq: Every day | ORAL | 0 refills | Status: DC | PRN

## 2020-02-02 MED ORDER — INFLUENZA VAC A&B SA ADJ QUAD 0.5 ML IM PRSY
0.5000 mL | PREFILLED_SYRINGE | Freq: Once | INTRAMUSCULAR | Status: DC
  Filled 2020-02-02: qty 0.5

## 2020-02-02 MED ORDER — INFLUENZA VAC A&B SA ADJ QUAD 0.5 ML IM PRSY
0.5000 mL | PREFILLED_SYRINGE | INTRAMUSCULAR | Status: DC
  Filled 2020-02-02: qty 0.5

## 2020-02-02 MED ORDER — ENOXAPARIN SODIUM 40 MG/0.4ML ~~LOC~~ SOLN: 40.0000 mg | SUBCUTANEOUS | 0 refills | Status: DC

## 2020-02-02 NOTE — Progress Notes (Signed)
  Speech Language Pathology Treatment: Dysphagia  Patient Details Name: Tracey Holmes MRN: [id number] DOB: [birth year] Today's Date: 02/02/2020 Time: 7209-4709 SLP Time Calculation (min) (ACUTE ONLY): 10 min  Assessment / Plan / Recommendation Clinical Impression  Pt was seen for skilled ST targeting dysphagia goals.  Upon arrival, pt could indicate her preference for a functional snack when provided with up to 4 choices with min assist verbal cues.  Pt consumed dys 2 textures and thin liquids with no overt s/s of aspiration with solids or liquids.  Pt had trace amounts of residual solids in the oral cavity post swallow which she cleared with min cues for use of a finger sweep.  Pt is scheduled for transfer to SNF today and [facility]AR arrived to take pt.  As a result, session was ended.  Recommend ongoing ST at next level of care to address communication and swallowing goals.    HPI HPI: 86yo female who reports a fall at home, found to have comminuted impacted subcapital R femoral fracture. Received R anterior approach THA 01/28/20.  LSN on 9/28, 0755 talking with NT and eating breakfast. RN came into room later and noticed aphasia and dysarthria. Stroke team arrived. Pt NIHSS 8 for inability to answer questions, follow commands, and aphasia. Pt taken to CT, shows Possible small right middle frontal gyrus cortical infarct. Pt passed a [employer].       SLP Plan  Continue with current plan of care       Recommendations  Diet recommendations: Dysphagia 2 (fine chop);Thin liquid Liquids provided via: Cup;Straw Medication Administration: Whole meds with liquid Supervision: Patient able to self feed;Full supervision/cueing for compensatory strategies Compensations: Small sips/bites;Slow rate Postural Changes and/or Swallow Maneuvers: Out of bed for meals;Seated upright 90 degrees;Upright 30-60 min after meal                Oral Care Recommendations: Oral care BID Follow up  Recommendations: Skilled Nursing facility SLP Visit Diagnosis: Dysphagia, oropharyngeal phase (R13.12);Aphasia (R47.01) Plan: Continue with current plan of care       GO                Tracey Holmes, Melanee Spry 02/02/2020, 1:53 PM

## 2020-02-02 NOTE — Progress Notes (Signed)
Physical Therapy Treatment Patient Details Name: Tracey Holmes MRN: [id number] DOB: 1933/09/21 Today's Date: 02/02/2020    History of Present Illness 86yo female who reports a fall at home, found to have comminuted impacted subcapital R femoral fracture. Received R anterior approach THA  01/28/20.  Noted change in status with new onset aphasia 01/30/20, MRI showing Multifocal acute ischemia within both hemispheres, left worse than right.   PMH HTN, HLD, thoracic aneurysm    PT Comments    Pt supine in bed on arrival and having difficulty reaching her lunch tray.  Assisted patient in OOB activity and short trial in room from bed to chair.  Pt to d/c today to rehab, plan remains appropriate.     Follow Up Recommendations  SNF;Supervision/Assistance - 24 hour     Equipment Recommendations  Rolling walker with 5" wheels;3in1 (PT)    Recommendations for Other Services       Precautions / Restrictions Precautions Precautions: Fall Precaution Comments: no hip precautions, direct anterior Restrictions Weight Bearing Restrictions: No RLE Weight Bearing: Weight bearing as tolerated    Mobility  Bed Mobility Overal bed mobility: Needs Assistance Bed Mobility: Rolling;Sidelying to Sit     Supine to sit: Min assist     General bed mobility comments: MIn assistance for RLE but able to utilize bed rail to scoot hips to edge of bed and elevate trunk into a seated position.  Transfers Overall transfer level: Needs assistance Equipment used: Rolling walker (2 wheeled) Transfers: Sit to/from Stand Sit to Stand: Min assist         General transfer comment: Min assistance to boost into standing.  Ambulation/Gait Ambulation/Gait assistance: Mod assist Gait Distance (Feet): 10 Feet Assistive device: Rolling walker (2 wheeled) Gait Pattern/deviations: Step-to pattern;Decreased step length - right;Steppage;Wide base of support     General Gait Details: Pt required assistance and  multimodal cueing to ambulate 10 ft.  Cues for RW safety and upper trunk control.  Pt stepping R foot outside of RW.  Pt continues to present with poor safety awareness.   Stairs             Wheelchair Mobility    Modified Rankin (Stroke Patients Only)       Balance Overall balance assessment: Needs assistance Sitting-balance support: Feet supported Sitting balance-Leahy Scale: Good Sitting balance - Comments: seated EOB for brushing teeth for about 5 minutes   Standing balance support: Bilateral upper extremity supported Standing balance-Leahy Scale: Poor Standing balance comment: UE support for balance                            Cognition Arousal/Alertness: Awake/alert Behavior During Therapy: WFL for tasks assessed/performed Overall Cognitive Status: Difficult to assess Area of Impairment: Following commands;Safety/judgement;Awareness;Problem solving                   Current Attention Level: Sustained Memory: Decreased recall of precautions;Decreased short-term memory Following Commands: Follows one step commands with increased time Safety/Judgement: Decreased awareness of safety;Decreased awareness of deficits   Problem Solving: Slow processing;Difficulty sequencing;Requires verbal cues;Requires tactile cues General Comments: Continues with expressive aphasia      Exercises      General Comments        Pertinent Vitals/Pain Pain Assessment: Faces Faces Pain Scale: Hurts little more Pain Location: RLE Pain Descriptors / Indicators: Grimacing;Guarding Pain Intervention(s): Monitored during session;Repositioned    Home Living  Prior Function            PT Goals (current goals can now be found in the care plan section) Acute Rehab PT Goals Patient Stated Goal: for SNF rehab Potential to Achieve Goals: Fair Progress towards PT goals: Progressing toward goals    Frequency    Min 3X/week       PT Plan Current plan remains appropriate    Co-evaluation              AM-PAC PT "6 Clicks" Mobility   Outcome Measure  Help needed turning from your back to your side while in a flat bed without using bedrails?: A Lot Help needed moving from lying on your back to sitting on the side of a flat bed without using bedrails?: A Lot Help needed moving to and from a bed to a chair (including a wheelchair)?: A Lot Help needed standing up from a chair using your arms (e.g., wheelchair or bedside chair)?: A Little Help needed to walk in hospital room?: A Lot Help needed climbing 3-5 steps with a railing? : Total 6 Click Score: 12    End of Session Equipment Utilized During Treatment: Gait belt Activity Tolerance: Patient tolerated treatment well Patient left: in chair;with call bell/phone within reach;with chair alarm set Nurse Communication: Mobility status;Weight bearing status PT Visit Diagnosis: History of falling (Z91.81);Muscle weakness (generalized) (M62.81);Other symptoms and signs involving the nervous system (R29.898);Other abnormalities of gait and mobility (R26.89)     Time: 1207-1217 PT Time Calculation (min) (ACUTE ONLY): 10 min  Charges:  $Gait Training: 8-22 mins                     Bonney Leitz , PTA Acute Rehabilitation Services Pager [phone] Office [phone]     Macala Baldonado Artis Delay 02/02/2020, 1:28 PM

## 2020-02-02 NOTE — Progress Notes (Signed)
Discharged to [facility] facility after IV accesses removed and report called to [place].  Was going to give flu vaccine but upon entering room pt was already gone so this nurse called niece to inform her that she did not get flu vaccine prior to discharge and I also notified Hale Bogus that she did not get it as well.

## 2020-02-02 NOTE — TOC Transition Note (Signed)
Transition of Care [facility]) - CM/SW Discharge Note   Patient Details  Name: Tracey Holmes MRN: [id number] Date of Birth: Sep 19, 1933  Transition of Care [facility]) CM/SW Contact:  Baldemar Lenis, LCSW Phone Number: 02/02/2020, 1:07 PM   Clinical Narrative:   Nurse to call report to [phone], Room 46B.    Final next level of care: Skilled Nursing Facility Barriers to Discharge: Barriers Resolved   Patient Goals and CMS Choice Patient states their goals for this hospitalization and ongoing recovery are:: Home with home health CMS Medicare.gov Compare Post Acute Care list provided to:: Patient    Discharge Placement              Patient chooses bed at: [facility] Patient to be transferred to facility by: [facility] Name of family member notified: Fannie Knee Patient and family notified of of transfer: 02/02/20  Discharge Plan and Services     Post Acute Care Choice: Durable Medical Equipment, Home Health                    HH Arranged: PT, OT   Date [facility] Agency Contacted: 01/29/20 Time HH Agency Contacted: [id number] Representative spoke with at [facility] Agency: Kandee Keen  Social Determinants of Health (SDOH) Interventions     Readmission Risk Interventions No flowsheet data found.

## 2020-02-02 NOTE — Progress Notes (Signed)
error 

## 2020-02-02 NOTE — Discharge Summary (Addendum)
Physician Discharge Summary  JERENE YEAGER ZOX:[id number] DOB: [birth year] DOA: 01/26/2020  PCP: Laurann Montana, MD  Admit date: 01/26/2020 Discharge date: 02/02/2020  Time spent: 40 minutes  Recommendations for Outpatient Follow-up:  1. Needs CBC Chem-12 1 week 2. Recommend outpatient follow-up with stroke team and with orthopedics 3. Needs consideration for right to left shunt followed by cardiology 4. Should continue Lovenox TED hose for 1 month for DVT prophylaxis post discharge through 10/26 2021  Discharge Diagnoses:  Active Problems:   Closed right hip fracture (HCC)   Aphasia   Acute embolic stroke [facility])   Discharge Condition: Improved  Diet recommendation: dysphagia 2 diet needed on d/c  Filed Weights   01/29/20 0449 01/31/20 0340 02/01/20 0324  Weight: 54.4 kg 55.1 kg 55 kg    History of present illness:  86 year old white female HTN polycystic kidney disease AAA prior kidney stones Admitted 01/26/2020 with hip fracture mechanical fall noted to have comminuted impacted subcapital right femoral fracture status post surgery 9/26 Found on 9/28 to have decreased level of consciousness speaking gibberish not making sense and found to have a new stroke in the right mid frontal gyrus indeterminate cerebellar infarcts Patient was recommended continuous cardiac monitoring 30 days after discharge Also was recommended to continue 81 mg aspirin    Hospital Course:  Comminuted impacted right femoral fracture status post surgery 6 9/26 Continue pain meds as per orthopedics Family wishing to take patient home Home health PT OT etc. on discharge  Acute stroke 9/26 left greater than sign right multifocal ischemia-transcranial Doppler large right to left shunt and EF 60% LDL 73 A1c 5.6 CT head showed right middle frontal gyrus infarct Will need secondary prevention with aspirin 81 mg Recommend high intensity statin on discharge Permissive hypertension for several days and  normalized in the next several days on discharge  CKD stage I Monitor trends in the outpatient setting with labs in about a week  Mild leukocytosis on discharge Probably post procedure or atelectasis-chest x-ray repeat on day of discharge was negative for pneumonia and she did not have dysuria leading me do not think that this is not infectious   Procedures: Multiple orthopedics   Consultations:  Orthopedics, neurology  Discharge Exam: Vitals:   02/02/20 0316 02/02/20 0802  BP: (!) 142/60 132/66  Pulse: 77 87  Resp: 17 18  Temp: 98.3 F (36.8 C) 98.9 F (37.2 C)  SpO2: 100% 100%    General: Awake slightly confused talking gibberish does not make much sense EOMI NCAT power 5/5 to all major muscle groups but limited in the right lower extremity secondary to surgery Finger-nose-finger deferred smile symmetric Cardiovascular: S1-S2 no murmur PVCs on monitors Respiratory: Clinically clear no added sound Abdomen soft no rebound  Discharge Instructions   Discharge Instructions    Diet - low sodium heart healthy   Complete by: As directed    Increase activity slowly   Complete by: As directed    No wound care   Complete by: As directed      Allergies as of 02/02/2020      Reactions   Penicillins Rash      Medication List    STOP taking these medications   aspirin 81 MG tablet Replaced by: aspirin 81 MG chewable tablet   dorzolamide-timolol 22.3-6.8 MG/ML ophthalmic solution Commonly known as: COSOPT   Rocklatan 0.02-0.005 % Soln Generic drug: Netarsudil-Latanoprost     TAKE these medications   amLODipine 5 MG tablet Commonly known as: NORVASC  Take 5 mg by mouth daily.   aspirin 81 MG chewable tablet Commonly known as: Aspirin Childrens Chew 1 tablet (81 mg total) by mouth 2 (two) times daily with a meal. Replaces: aspirin 81 MG tablet   brimonidine 0.2 % ophthalmic solution Commonly known as: ALPHAGAN Place 1 drop into both eyes 3 (three) times  daily.   denosumab 60 MG/ML Soln injection Commonly known as: PROLIA Inject 60 mg into the skin every 6 (six) months. Administer in upper arm, thigh, or abdomen   docusate sodium 100 MG capsule Commonly known as: COLACE Take 1 capsule (100 mg total) by mouth 2 (two) times daily.   enoxaparin 40 MG/0.4ML injection Commonly known as: LOVENOX Inject 0.4 mLs (40 mg total) into the skin daily. Start taking on: February 03, 2020   HYDROcodone-acetaminophen 5-325 MG tablet Commonly known as: NORCO/VICODIN Take 1-2 tablets by mouth every 4 (four) hours as needed for up to 7 days for moderate pain (pain score 4-6).   multivitamin with minerals Tabs tablet Take 1 tablet by mouth daily.   polyethylene glycol 17 g packet Commonly known as: MIRALAX / GLYCOLAX Take 17 g by mouth daily as needed for mild constipation.   pravastatin 80 MG tablet Commonly known as: PRAVACHOL Take 1 tablet by mouth every evening.   Vitamin D 50 MCG (2000 UT) tablet Take 4,000 Units by mouth daily.      Allergies  Allergen Reactions  . Penicillins Rash    Follow-up Information    Swinteck, Arlys John, MD. Schedule an appointment as soon as possible for a visit in 2 weeks.   Specialty: Orthopedic Surgery Why: For wound re-check Contact information: [address] [place] [address] [phone]                The results of significant diagnostics from this hospitalization (including imaging, microbiology, ancillary and laboratory) are listed below for reference.    Significant Diagnostic Studies: CT ANGIO HEAD W OR WO CONTRAST  Result Date: 01/30/2020 CLINICAL DATA:  86 year old female with recent right hip surgery, acute neurologic deficit with aphasia. EXAM: CT ANGIOGRAPHY HEAD AND NECK TECHNIQUE: Multidetector CT imaging of the head and neck was performed using the standard protocol during bolus administration of intravenous contrast. Multiplanar CT image reconstructions and  MIPs were obtained to evaluate the vascular anatomy. Carotid stenosis measurements (when applicable) are obtained utilizing NASCET criteria, using the distal internal carotid diameter as the denominator. CONTRAST:  75mL OMNIPAQUE IOHEXOL 350 MG/ML SOLN COMPARISON:  Plain head CT 0909 hours today FINDINGS: CTA NECK Skeleton: Right greater than left TMJ degeneration. Exaggerated cervical lordosis. No acute osseous abnormality identified. Upper chest: Negative. Other neck: No acute findings. Aortic arch: Mildly ectatic aortic arch with 3 vessel arch configuration. No arch atherosclerosis. Right carotid system: Tortuous brachiocephalic artery and proximal right CCA. Capacious right carotid bifurcation. Tortuous right ICA without plaque or stenosis. Left carotid system: Normal left CCA origin. Highly tortuous left CCA at the thoracic inlet (series 5, image 127). Capacious left carotid bifurcation. No stenosis. Vertebral arteries: Mild soft and calcified plaque at the right subclavian artery origin without stenosis. Normal right vertebral artery origin. Non dominant right vertebral artery appears patent and normal to the skull base. No proximal left subclavian plaque or stenosis. Normal left vertebral artery origin. Tortuous left V1 segment. Mildly dominant left vertebral artery otherwise patent and normal to the skull base. CTA HEAD Posterior circulation: Mildly dominant left V4 segment. No distal vertebral plaque or stenosis.  Normal PICA origins and vertebrobasilar junction. Patent mildly tortuous basilar artery without stenosis. Normal SCA and left PCA origins. Fetal type right PCA origin. Left posterior communicating is diminutive or absent. Bilateral PCA branches are mildly tortuous and otherwise within normal limits. Anterior circulation: Both ICA siphons are patent with only trace calcified plaque on the left and no stenosis. Ectatic right siphon. Ophthalmic and right posterior communicating artery origins are  normal. Patent carotid termini. Normal MCA and ACA origins. Anterior communicating artery is within normal limits. ACA branches are within normal limits. Left MCA M1 segment and bifurcation are patent without stenosis. Left MCA branches are within normal limits. Right MCA M1 segment and bifurcation are patent without stenosis. A somewhat diminutive anterior right M2 branch is noted (series 11, image 18), beyond which there is tortuosity and mild focal fusiform enlargement of the continuing posterior M2 (series 12, image 14). A downstream trifurcation is widely patent without stenosis. And no discrete right MCA branch occlusion is identified. Venous sinuses: Patent. Anatomic variants: Mildly dominant left vertebral artery. Fetal type right PCA origin. Review of the MIP images confirms the above findings IMPRESSION: 1. Negative for large vessel occlusion. 2. Minimal atherosclerosis in the head and neck, but there is generalized arterial tortuosity and ectasia. A mild fusiform enlargement of the dominant posterior Right MCA division in noted, with no discrete intracranial aneurysm. Salient findings reviewed in person with Dr. Marisue HumbleHUNTER COLLINS on 01/30/2020 at 0938 hours. Electronically Signed   By: Odessa FlemingH  Hall M.D.   On: 01/30/2020 09:47   DG Elbow Complete Right  Result Date: 01/26/2020 CLINICAL DATA:  Pain, bruising, and swelling after a fall today EXAM: RIGHT ELBOW - COMPLETE 3+ VIEW COMPARISON:  None. FINDINGS: There is an old appearing ununited ossicle over the lateral humeral epicondyle. Mild degenerative changes in the elbow. No evidence of acute fracture or dislocation. No focal bone lesion or bone destruction. No significant effusion. Soft tissues are unremarkable. IMPRESSION: No acute bony abnormalities. Electronically Signed   By: Burman NievesWilliam  Stevens M.D.   On: 01/26/2020 20:01   DG Abd 1 View  Result Date: 01/30/2020 CLINICAL DATA:  Foreign body EXAM: ABDOMEN - 1 VIEW COMPARISON:  None. FINDINGS: Contrast  material in the urinary bladder. Nonobstructed bowel-gas pattern. No radiopaque foreign body over the abdomen. Status post right hip replacement. IMPRESSION: Nonobstructed bowel-gas pattern. Electronically Signed   By: Jasmine PangKim  Fujinaga M.D.   On: 01/30/2020 20:07   MR BRAIN WO CONTRAST  Result Date: 01/30/2020 CLINICAL DATA:  Stroke follow-up EXAM: MRI HEAD WITHOUT CONTRAST TECHNIQUE: Multiplanar, multiecho pulse sequences of the brain and surrounding structures were obtained without intravenous contrast. COMPARISON:  Head CT 01/30/2020 FINDINGS: Brain: Multifocal abnormal diffusion restriction within both hemispheres. The largest area is in the left frontal operculum. This is in a location in keeping with aphasia. The largest contralateral abnormality is at the middle frontal gyrus. There is petechial hemorrhage within both frontal opercula. Multifocal hyperintense T2-weighted signal within the white matter. There is generalized atrophy without lobar predilection. Old small vessel infarct of the right cerebellum. No chronic microhemorrhage. Normal midline structures. Vascular: Normal flow voids Skull and upper cervical spine: Normal Sinuses/Orbits: Bilateral lens replacements. Paranasal sinuses are clear. No mastoid effusion. Normal nasopharynx. Other: None IMPRESSION: 1. Multifocal acute ischemia within both hemispheres, left worse than right. The largest infarct, involving the left frontal operculum, is in keeping with reported aphasia. 2. Petechial hemorrhage within both frontal opercula. No mass effect. [facility](Heidelberg classification 1b: HI2, confluent petechiae, no  mass effect.) Electronically Signed   By: Deatra Robinson M.D.   On: 01/30/2020 19:03   Pelvis Portable  Result Date: 01/28/2020 CLINICAL DATA:  Status post right hip replacement EXAM: PORTABLE PELVIS 1-2 VIEWS COMPARISON:  Intraoperative films from earlier in the same day. FINDINGS: Right hip prosthesis is now seen in satisfactory position. No acute  fracture or dislocation is noted. No soft tissue abnormality is noted. IMPRESSION: Status post right hip replacement. Electronically Signed   By: Alcide Clever M.D.   On: 01/28/2020 11:01   CT HIP RIGHT WO CONTRAST  Result Date: 01/26/2020 CLINICAL DATA:  Right hip fracture after fall EXAM: CT OF THE RIGHT HIP WITHOUT CONTRAST TECHNIQUE: Multidetector CT imaging of the right hip was performed according to the standard protocol. Multiplanar CT image reconstructions were also generated. COMPARISON:  01/26/2011 FINDINGS: Bones/Joint/Cartilage There is an impacted comminuted subcapital right femoral neck fracture, with varus and ventral angulation at the fracture site. No dislocation. No other acute displaced fractures. Visualized portions of the bony pelvis are unremarkable. Ligaments Suboptimally assessed by CT. Muscles and Tendons No evidence of muscular injury. Soft tissues There is subcutaneous edema overlying the lateral aspect right hip, consistent with contusion. No fluid collection or hematoma. There is a right inguinal hernia which contains a gas-filled appendix. Remaining visualized soft tissues are unremarkable. Reconstructed images demonstrate no additional findings. IMPRESSION: 1. Comminuted impacted subcapital right femoral neck fracture, with varus and ventral angulation at the fracture site. 2. Subcutaneous edema overlying the right hip consistent with contusion. Electronically Signed   By: Sharlet Salina M.D.   On: 01/26/2020 22:11   DG CHEST PORT 1 VIEW  Result Date: 02/02/2020 CLINICAL DATA:  Reported recent pneumonia EXAM: PORTABLE CHEST 1 VIEW COMPARISON:  February 24, 2013 FINDINGS: Lungs are clear. Heart size and pulmonary vascularity are normal. No adenopathy. Bones appear osteoporotic. IMPRESSION: Lungs clear.  Cardiac silhouette normal. Electronically Signed   By: Bretta Bang III M.D.   On: 02/02/2020 09:06   DG C-Arm 1-60 Min  Result Date: 01/28/2020 CLINICAL DATA:  Post total  right hip arthroplasty. EXAM: DG C-ARM 1-60 MIN FLUOROSCOPY TIME:  Fluoroscopy Time:  10 seconds. Number of Acquired Spot Images: 0 COMPARISON:  January 26, 2020 FINDINGS: Intraoperative fluoroscopic images demonstrate resection of the right femoral head and neck and placement of total right hip prosthesis, with normal alignment of the prosthetic components. IMPRESSION: Post total right hip arthroplasty without evidence of immediate complications. Electronically Signed   By: Ted Mcalpine M.D.   On: 01/28/2020 11:03   VAS [place] TRANSCRANIAL DOPPLER W BUBBLES  Result Date: 02/01/2020  Transcranial Doppler with Bubble Indications: Stroke. Comparison Study: No prior studies. Performing Technologist: Chanda Busing RVT  Examination Guidelines: A complete evaluation includes B-mode imaging, spectral Doppler, color Doppler, and power Doppler as needed of all accessible portions of each vessel. Bilateral testing is considered an integral part of a complete examination. Limited examinations for reoccurring indications may be performed as noted.  Summary:  A vascular evaluation was performed. The right middle cerebral artery was studied. An IV was inserted into the patient's right Cephalic. Verbal informed consent was obtained.  Curtain effect was noted which is indicative of a large PFO. *See table(s) above for TCD measurements and observations.    Preliminary    ECHOCARDIOGRAM COMPLETE  Result Date: 01/31/2020    ECHOCARDIOGRAM REPORT   Patient Name:   REVECA DESMARAIS Date of Exam: 01/31/2020 Medical Rec #:  [id number]  Height:       63.0 in Accession #:    [id number]        Weight:       121.5 lb Date of Birth:  1934-02-15         BSA:          1.564 m Patient Age:    86 years          BP:           132/72 mmHg Patient Gender: F                 HR:           99 bpm. Exam Location:  Inpatient Procedure: 2D Echo, Cardiac Doppler and Color Doppler Indications:    Stroke  History:        Patient has no  prior history of Echocardiogram examinations.                 Risk Factors:Hypertension and Dyslipidemia. Thoracic aortic                 aneurysm.  Sonographer:    Sheralyn Boatman RDCS Referring Phys: [id number] STEPHEN K CHIU  Sonographer Comments: Technically difficult study due to poor echo windows. IMPRESSIONS  1. There is acceleration of flow through left ventricular outflow tract. Non obstructive. Left ventricular ejection fraction, by estimation, is 65 to 70%. The left ventricle has normal function. The left ventricle has no regional wall motion abnormalities. Left ventricular diastolic parameters are consistent with Grade I diastolic dysfunction (impaired relaxation).  2. Right ventricular systolic function is normal. The right ventricular size is normal.  3. The mitral valve is normal in structure. No evidence of mitral valve regurgitation. No evidence of mitral stenosis.  4. The aortic valve is normal in structure. Aortic valve regurgitation is not visualized. No aortic stenosis is present.  5. Aortic dilatation noted. There is mild dilatation of the ascending aorta, measuring 40 mm.  6. The inferior vena cava is normal in size with greater than 50% respiratory variability, suggesting right atrial pressure of 3 mmHg. Conclusion(s)/Recommendation(s): No intracardiac source of embolism detected on this transthoracic study. A transesophageal echocardiogram is recommended to exclude cardiac source of embolism if clinically indicated. FINDINGS  Left Ventricle: There is acceleration of flow through left ventricular outflow tract. Non obstructive. Left ventricular ejection fraction, by estimation, is 65 to 70%. The left ventricle has normal function. The left ventricle has no regional wall motion abnormalities. The left ventricular internal cavity size was normal in size. There is no left ventricular hypertrophy. Left ventricular diastolic parameters are consistent with Grade I diastolic dysfunction (impaired relaxation).  Right Ventricle: The right ventricular size is normal. No increase in right ventricular wall thickness. Right ventricular systolic function is normal. Left Atrium: Left atrial size was normal in size. Right Atrium: Right atrial size was normal in size. Pericardium: There is no evidence of pericardial effusion. Mitral Valve: The mitral valve is normal in structure. No evidence of mitral valve regurgitation. No evidence of mitral valve stenosis. MV peak gradient, 14.4 mmHg. The mean mitral valve gradient is 3.0 mmHg. Tricuspid Valve: The tricuspid valve is normal in structure. Tricuspid valve regurgitation is trivial. No evidence of tricuspid stenosis. Aortic Valve: The aortic valve is normal in structure. Aortic valve regurgitation is not visualized. No aortic stenosis is present. Pulmonic Valve: The pulmonic valve was normal in structure. Pulmonic valve regurgitation is not visualized. No evidence of pulmonic stenosis. Aorta: Aortic  dilatation noted. There is mild dilatation of the ascending aorta, measuring 40 mm. Venous: The inferior vena cava is normal in size with greater than 50% respiratory variability, suggesting right atrial pressure of 3 mmHg. IAS/Shunts: No atrial level shunt detected by color flow Doppler.  LEFT VENTRICLE PLAX 2D LVIDd:         2.90 cm     Diastology LVIDs:         2.10 cm     LV e' medial:    9.06 cm/s LV PW:         1.10 cm     LV E/e' medial:  10.8 LV IVS:        1.10 cm     LV e' lateral:   10.90 cm/s LVOT diam:     1.80 cm     LV E/e' lateral: 9.0 LV SV:         56 LV SV Index:   36 LVOT Area:     2.54 cm  LV Volumes (MOD) LV vol d, MOD A2C: 51.0 ml LV vol d, MOD A4C: 40.2 ml LV vol s, MOD A2C: 17.2 ml LV vol s, MOD A4C: 16.5 ml LV SV MOD A2C:     33.8 ml LV SV MOD A4C:     40.2 ml LV SV MOD BP:      30.0 ml RIGHT VENTRICLE             IVC RV S prime:     13.20 cm/s  IVC diam: 1.60 cm TAPSE (M-mode): 1.9 cm LEFT ATRIUM             Index       RIGHT ATRIUM          Index LA diam:         2.30 cm 1.47 cm/m  RA Area:     8.57 cm LA Vol (A2C):   18.2 ml 11.63 ml/m RA Volume:   15.70 ml 10.04 ml/m LA Vol (A4C):   23.8 ml 15.21 ml/m LA Biplane Vol: 21.8 ml 13.94 ml/m  AORTIC VALVE LVOT Vmax:   137.00 cm/s LVOT Vmean:  83.900 cm/s LVOT VTI:    0.221 m  AORTA Ao Root diam: 2.80 cm Ao Asc diam:  4.05 cm Ao Arch diam: 4.0 cm MITRAL VALVE MV Area (PHT): 3.31 cm     SHUNTS MV Peak grad:  14.4 mmHg    Systemic VTI:  0.22 m MV Mean grad:  3.0 mmHg     Systemic Diam: 1.80 cm MV Vmax:       1.90 m/s MV Vmean:      73.2 cm/s MV Decel Time: 229 msec MV E velocity: 98.10 cm/s MV A velocity: 157.00 cm/s MV E/A ratio:  0.62 Donato Schultz MD Electronically signed by Donato Schultz MD Signature Date/Time: 01/31/2020/10:40:23 AM    Final    DG HIP OPERATIVE UNILAT W OR W/O PELVIS RIGHT  Result Date: 01/28/2020 CLINICAL DATA:  Right hip arthroplasty. EXAM: OPERATIVE RIGHT HIP (WITH PELVIS IF PERFORMED) TECHNIQUE: Fluoroscopic spot image(s) were submitted for interpretation post-operatively. COMPARISON:  January 26, 2020 FINDINGS: Post total right hip arthroplasty with resection of the right femoral head and neck. Normal alignment of the prosthetic components. No evidence of fractures. IMPRESSION: Post total right hip arthroplasty without evidence of immediate complications. Electronically Signed   By: Ted Mcalpine M.D.   On: 01/28/2020 11:00   DG HIP UNILAT WITH PELVIS 2-3 VIEWS RIGHT  Result Date:  01/26/2020 CLINICAL DATA:  Fall today, right hip injury, right foot turned out with no range of motion. EXAM: DG HIP (WITH OR WITHOUT PELVIS) 2-3V RIGHT COMPARISON:  None. FINDINGS: Poorly evaluated right femoral neck fracture. No dislocation of the bilateral hips. Frontal views of the left hip demonstrates no acute displaced fracture. Frontal views of the pelvis demonstrate no acute displaced fracture or diastasis. There is no evidence of severe arthropathy or other focal bone abnormality. Overlying  subcutaneus soft tissue edema. Metallic density overlying the lumbar spine likely external to the patient. IMPRESSION: Right femoral neck fracture.  Consider CT for further evaluation. Electronically Signed   By: Tish Frederickson M.D.   On: 01/26/2020 20:02   CT HEAD CODE STROKE WO CONTRAST  Result Date: 01/30/2020 CLINICAL DATA:  Code stroke. 86 year old female with aphasia. Recent right hip surgery. EXAM: CT HEAD WITHOUT CONTRAST TECHNIQUE: Contiguous axial images were obtained from the base of the skull through the vertex without intravenous contrast. COMPARISON:  None. FINDINGS: Brain: Faint dystrophic calcifications in the bilateral basal ganglia. No acute intracranial hemorrhage identified. No midline shift, mass effect, or evidence of intracranial mass lesion. No ventriculomegaly. Largely normal gray-white matter differentiation throughout the brain. There is a small area of cortical hypodensity in the right middle frontal gyrus on series 3, image 26. And there is evidence of a tiny age indeterminate right cerebellar infarct on series 3, image 8. No other changes of acute cortically based infarct. Vascular: Mild for age Calcified atherosclerosis at the skull base. No suspicious intracranial vascular hyperdensity. Skull: Severe TMJ degeneration on the right. No acute osseous abnormality identified. Sinuses/Orbits: Visualized paranasal sinuses and mastoids are clear. Other: Postoperative changes to both globes. No acute orbit or scalp soft tissue finding. ASPECTS [facility] Stroke Program Early CT Score) - Ganglionic level infarction (caudate, lentiform nuclei, internal capsule, insula, M1-M3 cortex): 7 - Supraganglionic infarction (M4-M6 cortex): 2 (right M5). Total score (0-10 with 10 being normal): 9 (on the right). IMPRESSION: 1. No prior study. Possible small right middle frontal gyrus cortical infarct. ASPECTS 9. 2. No intracranial hemorrhage or mass effect. 3. Tiny age indeterminate right cerebellar  infarct. 4. These results were communicated to Dr. Thomasena Edis at 9:24 am on 01/30/2020 by text page via the [facility] messaging system. Electronically Signed   By: Odessa Fleming M.D.   On: 01/30/2020 09:26   VAS [place] LOWER EXTREMITY VENOUS (DVT)  Result Date: 02/01/2020  Lower Venous DVTStudy Indications: Stroke, and PFO.  Risk Factors: None identified. Limitations: Poor ultrasound/tissue interface and patient positioning. Comparison Study: No prior studies. Performing Technologist: Chanda Busing RVT  Examination Guidelines: A complete evaluation includes B-mode imaging, spectral Doppler, color Doppler, and power Doppler as needed of all accessible portions of each vessel. Bilateral testing is considered an integral part of a complete examination. Limited examinations for reoccurring indications may be performed as noted. The reflux portion of the exam is performed with the patient in reverse Trendelenburg.  +---------+---------------+---------+-----------+----------+--------------+ RIGHT    CompressibilityPhasicitySpontaneityPropertiesThrombus Aging +---------+---------------+---------+-----------+----------+--------------+ CFV      Full           Yes      Yes                                 +---------+---------------+---------+-----------+----------+--------------+ SFJ      Full                                                        +---------+---------------+---------+-----------+----------+--------------+  FV Prox  Full                                                        +---------+---------------+---------+-----------+----------+--------------+ FV Mid   Full                                                        +---------+---------------+---------+-----------+----------+--------------+ FV DistalFull                                                        +---------+---------------+---------+-----------+----------+--------------+ PFV      Full                                                         +---------+---------------+---------+-----------+----------+--------------+ POP      Full           Yes      Yes                                 +---------+---------------+---------+-----------+----------+--------------+ PTV      Full                                                        +---------+---------------+---------+-----------+----------+--------------+ PERO     Full                                                        +---------+---------------+---------+-----------+----------+--------------+   +---------+---------------+---------+-----------+----------+--------------+ LEFT     CompressibilityPhasicitySpontaneityPropertiesThrombus Aging +---------+---------------+---------+-----------+----------+--------------+ CFV      Full           Yes      Yes                                 +---------+---------------+---------+-----------+----------+--------------+ SFJ      Full                                                        +---------+---------------+---------+-----------+----------+--------------+ FV Prox  Full                                                        +---------+---------------+---------+-----------+----------+--------------+  FV Mid   Full                                                        +---------+---------------+---------+-----------+----------+--------------+ FV DistalFull                                                        +---------+---------------+---------+-----------+----------+--------------+ PFV      Full                                                        +---------+---------------+---------+-----------+----------+--------------+ POP      Full           Yes      Yes                                 +---------+---------------+---------+-----------+----------+--------------+ PTV      Full                                                         +---------+---------------+---------+-----------+----------+--------------+ PERO                                                  Not visualized +---------+---------------+---------+-----------+----------+--------------+     Summary: RIGHT: - There is no evidence of deep vein thrombosis in the lower extremity.  - No cystic structure found in the popliteal fossa.  LEFT: - There is no evidence of deep vein thrombosis in the lower extremity. However, portions of this examination were limited- see technologist comments above.  - No cystic structure found in the popliteal fossa.  *See table(s) above for measurements and observations. Electronically signed by Sherald Hess MD on 02/01/2020 at 2:28:31 PM.    Final    CT ANGIO NECK CODE STROKE  Result Date: 01/30/2020 CLINICAL DATA:  86 year old female with recent right hip surgery, acute neurologic deficit with aphasia. EXAM: CT ANGIOGRAPHY HEAD AND NECK TECHNIQUE: Multidetector CT imaging of the head and neck was performed using the standard protocol during bolus administration of intravenous contrast. Multiplanar CT image reconstructions and MIPs were obtained to evaluate the vascular anatomy. Carotid stenosis measurements (when applicable) are obtained utilizing NASCET criteria, using the distal internal carotid diameter as the denominator. CONTRAST:  38mL OMNIPAQUE IOHEXOL 350 MG/ML SOLN COMPARISON:  Plain head CT 0909 hours today FINDINGS: CTA NECK Skeleton: Right greater than left TMJ degeneration. Exaggerated cervical lordosis. No acute osseous abnormality identified. Upper chest: Negative. Other neck: No acute findings. Aortic arch: Mildly ectatic aortic arch with 3 vessel arch configuration. No arch atherosclerosis. Right carotid system: Tortuous brachiocephalic artery and proximal right CCA. Capacious right carotid bifurcation.  Tortuous right ICA without plaque or stenosis. Left carotid system: Normal left CCA origin. Highly tortuous left CCA at the  thoracic inlet (series 5, image 127). Capacious left carotid bifurcation. No stenosis. Vertebral arteries: Mild soft and calcified plaque at the right subclavian artery origin without stenosis. Normal right vertebral artery origin. Non dominant right vertebral artery appears patent and normal to the skull base. No proximal left subclavian plaque or stenosis. Normal left vertebral artery origin. Tortuous left V1 segment. Mildly dominant left vertebral artery otherwise patent and normal to the skull base. CTA HEAD Posterior circulation: Mildly dominant left V4 segment. No distal vertebral plaque or stenosis. Normal PICA origins and vertebrobasilar junction. Patent mildly tortuous basilar artery without stenosis. Normal SCA and left PCA origins. Fetal type right PCA origin. Left posterior communicating is diminutive or absent. Bilateral PCA branches are mildly tortuous and otherwise within normal limits. Anterior circulation: Both ICA siphons are patent with only trace calcified plaque on the left and no stenosis. Ectatic right siphon. Ophthalmic and right posterior communicating artery origins are normal. Patent carotid termini. Normal MCA and ACA origins. Anterior communicating artery is within normal limits. ACA branches are within normal limits. Left MCA M1 segment and bifurcation are patent without stenosis. Left MCA branches are within normal limits. Right MCA M1 segment and bifurcation are patent without stenosis. A somewhat diminutive anterior right M2 branch is noted (series 11, image 18), beyond which there is tortuosity and mild focal fusiform enlargement of the continuing posterior M2 (series 12, image 14). A downstream trifurcation is widely patent without stenosis. And no discrete right MCA branch occlusion is identified. Venous sinuses: Patent. Anatomic variants: Mildly dominant left vertebral artery. Fetal type right PCA origin. Review of the MIP images confirms the above findings IMPRESSION: 1.  Negative for large vessel occlusion. 2. Minimal atherosclerosis in the head and neck, but there is generalized arterial tortuosity and ectasia. A mild fusiform enlargement of the dominant posterior Right MCA division in noted, with no discrete intracranial aneurysm. Salient findings reviewed in person with Dr. Marisue Humble on 01/30/2020 at 0938 hours. Electronically Signed   By: Odessa Fleming M.D.   On: 01/30/2020 09:47    Microbiology: Recent Results (from the past 240 hour(s))  Respiratory Panel by RT PCR (Flu A&B, Covid) - Nasopharyngeal Swab     Status: None   Collection Time: 01/26/20 10:50 PM   Specimen: Nasopharyngeal Swab  Result Value Ref Range Status   SARS Coronavirus 2 by RT PCR NEGATIVE NEGATIVE Final    Comment: (NOTE) SARS-CoV-2 target nucleic acids are NOT DETECTED.  The SARS-CoV-2 RNA is generally detectable in upper respiratoy specimens during the acute phase of infection. The lowest concentration of SARS-CoV-2 viral copies this assay can detect is 131 copies/mL. A negative result does not preclude SARS-Cov-2 infection and should not be used as the sole basis for treatment or other patient management decisions. A negative result may occur with  improper specimen collection/handling, submission of specimen other than nasopharyngeal swab, presence of viral mutation(s) within the areas targeted by this assay, and inadequate number of viral copies (<131 copies/mL). A negative result must be combined with clinical observations, patient history, and epidemiological information. The expected result is Negative.  Fact Sheet for Patients:  https://www.moore.com/  Fact Sheet for Healthcare Providers:  https://www.young.biz/  This test is no t yet approved or cleared by the [place] FDA and  has been authorized for detection and/or diagnosis of SARS-CoV-2 by FDA under an Emergency Use  Authorization (EUA). This EUA will remain  in effect  (meaning this test can be used) for the duration of the COVID-19 declaration under Section 564(b)(1) of the Act, 21 U.S.C. section 360bbb-3(b)(1), unless the authorization is terminated or revoked sooner.     Influenza A by PCR NEGATIVE NEGATIVE Final   Influenza B by PCR NEGATIVE NEGATIVE Final    Comment: (NOTE) The Xpert Xpress SARS-CoV-2/FLU/RSV assay is intended as an aid in  the diagnosis of influenza from Nasopharyngeal swab specimens and  should not be used as a sole basis for treatment. Nasal washings and  aspirates are unacceptable for Xpert Xpress SARS-CoV-2/FLU/RSV  testing.  Fact Sheet for Patients: https://www.moore.com/  Fact Sheet for Healthcare Providers: https://www.young.biz/  This test is not yet approved or cleared by the [place] FDA and  has been authorized for detection and/or diagnosis of SARS-CoV-2 by  FDA under an Emergency Use Authorization (EUA). This EUA will remain  in effect (meaning this test can be used) for the duration of the  Covid-19 declaration under Section 564(b)(1) of the Act, 21  U.S.C. section 360bbb-3(b)(1), unless the authorization is  terminated or revoked. Performed at [facility], [address] [place], [place] [address]   Surgical pcr screen     Status: None   Collection Time: 01/27/20  5:25 PM   Specimen: Nasal Mucosa; Nasal Swab  Result Value Ref Range Status   MRSA, PCR NEGATIVE NEGATIVE Final   Staphylococcus aureus NEGATIVE NEGATIVE Final    Comment: (NOTE) The Xpert SA Assay (FDA approved for NASAL specimens in patients 65 years of age and older), is one component of a comprehensive surveillance program. It is not intended to diagnose infection nor to guide or monitor treatment. Performed at [facility], [address], [place], [place] [address]      Labs: Basic Metabolic Panel: Recent Labs  Lab 01/27/20 [phone] 01/27/20 0621 01/28/20 0155 01/29/20 1444  01/30/20 0437 01/31/20 0656 02/02/20 0225  NA 138   < > 136 139 138 139 139  K 4.3   < > 4.2 3.7 3.7 3.7 3.5  CL 103   < > 106 109 107 110 107  CO2 23   < > 21* 20* 21* 17* 21*  GLUCOSE 140*   < > 114* 207* 112* 119* 101*  BUN 13   < > 15 26* 23 17 22   CREATININE 0.61   < > 0.81 0.98 0.70 0.56 0.56  CALCIUM 8.6*   < > 8.3* 7.9* 7.7* 7.8* 7.8*  MG 2.1  --   --   --   --   --   --    < > = values in this interval not displayed.   Liver Function Tests: Recent Labs  Lab 01/27/20 0621 01/31/20 0656  AST 25 32  ALT 23 24  ALKPHOS 44 52  BILITOT 1.1 1.2  PROT 6.9 5.2*  ALBUMIN 4.0 2.7*   No results for input(s): LIPASE, AMYLASE in the last 168 hours. No results for input(s): AMMONIA in the last 168 hours. CBC: Recent Labs  Lab 01/26/20 1908 01/26/20 1908 01/27/20 0621 01/27/20 0621 01/28/20 0155 01/29/20 1444 01/30/20 0437 01/31/20 0656 02/02/20 0225  WBC 14.7*   < > 12.7*   < > 11.4* 10.8* 10.2 9.9 11.8*  NEUTROABS 12.1*  --  9.5*  --  8.0*  --   --   --   --   HGB 14.8   < > 15.3*   < >  14.1 9.9* 8.7* 9.2* 8.6*  HCT 46.0   < > 46.4*   < > 43.7 30.9* 26.9* 28.7* 26.1*  MCV 98.9   < > 96.3   < > 96.9 100.3* 97.8 101.1* 97.8  PLT 243   < > 237   < > 220 192 213 223 264   < > = values in this interval not displayed.   Cardiac Enzymes: No results for input(s): CKTOTAL, CKMB, CKMBINDEX, TROPONINI in the last 168 hours. BNP: BNP (last 3 results) No results for input(s): BNP in the last 8760 hours.  ProBNP (last 3 results) No results for input(s): PROBNP in the last 8760 hours.  CBG: Recent Labs  Lab 01/30/20 0959  GLUCAP 92       Signed:  Rhetta Mura MD   [facility] 02/02/2020, 10:03 AM

## 2020-02-02 NOTE — Care Management Important Message (Signed)
Important Message  Patient Details  Name: Tracey Holmes MRN: [id number] Date of Birth: 1933/10/13   Medicare Important Message Given:  Yes     Kassi Esteve 02/02/2020, 4:20 PM

## 2020-02-02 NOTE — Progress Notes (Signed)
Ordered 30-Day Event Monitor for work-up of stroke at the request of Dr. Mahala Menghini. Of note, patient has never been seen by our office.  Corrin Parker, PA-C 02/02/2020 10:41 AM

## 2020-02-05 ENCOUNTER — Telehealth: Payer: Self-pay | Admitting: *Deleted

## 2020-02-05 ENCOUNTER — Encounter: Payer: Self-pay | Admitting: *Deleted

## 2020-02-05 NOTE — Telephone Encounter (Signed)
Preventice 30 day cardiac event monitor to be shipped to: [facility], Attn: [facility] Nurse [address], [address] [place], [place] [address] C/O Mickeal Needy   Letter with instructions mailed to same.

## 2020-02-15 ENCOUNTER — Ambulatory Visit (INDEPENDENT_AMBULATORY_CARE_PROVIDER_SITE_OTHER): Payer: Medicare Other

## 2020-02-15 DIAGNOSIS — I639 Cerebral infarction, unspecified: Secondary | ICD-10-CM

## 2020-02-15 DIAGNOSIS — I480 Paroxysmal atrial fibrillation: Secondary | ICD-10-CM | POA: Diagnosis not present

## 2020-03-15 ENCOUNTER — Other Ambulatory Visit: Payer: Self-pay

## 2020-03-15 ENCOUNTER — Ambulatory Visit: Payer: Medicare Other | Admitting: Internal Medicine

## 2020-03-15 ENCOUNTER — Encounter: Payer: Self-pay | Admitting: Internal Medicine

## 2020-03-15 VITALS — BP 128/64 | HR 83 | Ht 63.0 in | Wt 114.0 lb

## 2020-03-15 DIAGNOSIS — I6340 Cerebral infarction due to embolism of unspecified cerebral artery: Secondary | ICD-10-CM | POA: Diagnosis not present

## 2020-03-15 DIAGNOSIS — I712 Thoracic aortic aneurysm, without rupture, unspecified: Secondary | ICD-10-CM

## 2020-03-15 DIAGNOSIS — I10 Essential (primary) hypertension: Secondary | ICD-10-CM | POA: Diagnosis not present

## 2020-03-15 DIAGNOSIS — E785 Hyperlipidemia, unspecified: Secondary | ICD-10-CM | POA: Diagnosis not present

## 2020-03-15 NOTE — Patient Instructions (Signed)
Medication Instructions:  Your physician recommends that you continue on your current medications as directed. Please refer to the Current Medication list given to you today.  *If you need a refill on your cardiac medications before your next appointment, please call your pharmacy*  Lab Work: None ordered today  Testing/Procedures: Your physician has requested that you have an limited echocardiogram bubble study. Echocardiography is a painless test that uses sound waves to create images of your heart. It provides your doctor with information about the size and shape of your heart and how well your heart's chambers and valves are working. This procedure takes approximately one hour. There are no restrictions for this procedure.  Follow-Up: At [facility], you and your health needs are our priority.  As part of our continuing mission to provide you with exceptional heart care, we have created designated Provider Care Teams.  These Care Teams include your primary Cardiologist (physician) and Advanced Practice Providers (APPs -  Physician Assistants and Nurse Practitioners) who all work together to provide you with the care you need, when you need it.  Your next appointment:   2 month(s)  The format for your next appointment:   In Person  Provider:   Riley Lam, MD

## 2020-03-15 NOTE — Progress Notes (Signed)
Cardiology Office Note:    Date:  03/15/2020   ID:  Tracey Holmes, DOB 06/25/1933, MRN [id number]  PCP:  Laurann Montana, MD  [facility] Cardiologist:  No primary care provider on file.  [facility] Electrophysiologist:  None   CC Said she had heart problems Consulted for the evaluation of thoracic AAA at the behest of Laurann Montana, MD  History of Present Illness:    Tracey Holmes is a 86 y.o. female with a hx of HTN, HLD, TAAA (mild 4.0 cm) followed by Dr. Alla German, who presents after hospital follow up.   Patient notes that she had an embolic stroke. Discharge 02/02/20 with embolic stroke. Patient had rehab and is now at home.  No chest pain, shortness of breath.  PND, orthopnea.  No palpitations.  Patient was told that she had embolic stroke after hip surgery, no DVT found and not placed on AC at discharge. Patient has her strength back and is back at home.  Past Medical History:  Diagnosis Date  . Aneurysm, descending thoracic aorta    Proximal (1.5 cm)  . Cystic kidney disease   . Dyslipidemia   . HTN (hypertension)   . Personal history of kidney stones     Past Surgical History:  Procedure Laterality Date  . TOTAL HIP ARTHROPLASTY Right 01/28/2020   Procedure: TOTAL HIP ARTHROPLASTY ANTERIOR APPROACH;  Surgeon: Samson Frederic, MD;  Location: [facility] OR;  Service: Orthopedics;  Laterality: Right;    Current Medications: Current Meds  Medication Sig  . amLODipine (NORVASC) 5 MG tablet Take 5 mg by mouth daily.   Marland Kitchen aspirin EC 81 MG tablet Take 81 mg by mouth in the morning and at bedtime.  . brimonidine (ALPHAGAN) 0.2 % ophthalmic solution Place 1 drop into both eyes 3 (three) times daily.  . Cholecalciferol (VITAMIN D) 2000 UNITS tablet Take 4,000 Units by mouth daily.  Marland Kitchen denosumab (PROLIA) 60 MG/ML SOLN injection Inject 60 mg into the skin every 6 (six) months. Administer in upper arm, thigh, or abdomen  . docusate sodium (COLACE) 100 MG capsule Take 1  capsule (100 mg total) by mouth 2 (two) times daily.  . Multiple Vitamin (MULTIVITAMIN WITH MINERALS) TABS tablet Take 1 tablet by mouth daily.  . polyethylene glycol (MIRALAX / GLYCOLAX) 17 g packet Take 17 g by mouth daily as needed for mild constipation.  . pravastatin (PRAVACHOL) 80 MG tablet Take 1 tablet by mouth every evening.  . [DISCONTINUED] enoxaparin (LOVENOX) 40 MG/0.4ML injection Inject 0.4 mLs (40 mg total) into the skin daily.     Allergies:   Penicillins   Social History   Socioeconomic History  . Marital status: Single    Spouse name: Not on file  . Number of children: Not on file  . Years of education: Not on file  . Highest education level: Not on file  Occupational History  . Not on file  Tobacco Use  . Smoking status: Never Smoker  . Smokeless tobacco: Never Used  Substance and Sexual Activity  . Alcohol use: No  . Drug use: No  . Sexual activity: Not on file  Other Topics Concern  . Not on file  Social History Narrative  . Not on file   Social Determinants of Health   Financial Resource Strain:   . Difficulty of Paying Living Expenses: Not on file  Food Insecurity:   . Worried About Programme researcher, broadcasting/film/video in the Last Year: Not on file  . Ran Out  of Food in the Last Year: Not on file  Transportation Needs:   . Lack of Transportation (Medical): Not on file  . Lack of Transportation (Non-Medical): Not on file  Physical Activity:   . Days of Exercise per Week: Not on file  . Minutes of Exercise per Session: Not on file  Stress:   . Feeling of Stress : Not on file  Social Connections:   . Frequency of Communication with Friends and Family: Not on file  . Frequency of Social Gatherings with Friends and Family: Not on file  . Attends Religious Services: Not on file  . Active Member of Clubs or Organizations: Not on file  . Attends Banker Meetings: Not on file  . Marital Status: Not on file     Family History: The patient's family  history is negative for AF.  ROS:   Please see the history of present illness.    Notes discomfort with Preventice monitor and wants it off. All other systems reviewed and are negative.  EKGs/Labs/Other Studies Reviewed:    The following studies were reviewed today:  EKG:  01/30/20- SR 90 low voltage precordial  leads  Recent Labs: 01/27/2020: Magnesium 2.1; TSH 1.039 01/31/2020: ALT 24 02/02/2020: BUN 22; Creatinine, Ser 0.56; Hemoglobin 8.6; Platelets 264; Potassium 3.5; Sodium 139  Recent Lipid Panel    Component Value Date/Time   CHOL 135 01/31/2020 0656   TRIG 84 01/31/2020 0656   HDL 45 01/31/2020 0656   CHOLHDL 3.0 01/31/2020 0656   VLDL 17 01/31/2020 0656   LDLCALC 73 01/31/2020 0656   OSH labs A1c 5.9 01/31/20 BNP 03/07/20- 357 Hgb 14.6, Platelet 307, Wbc 8.3 Creatinine 0.88  Echo  1. There is acceleration of flow through left ventricular outflow tract.  Non obstructive. Left ventricular ejection fraction, by estimation, is 65  to 70%. The left ventricle has normal function. The left ventricle has no  regional wall motion  abnormalities. Left ventricular diastolic parameters are consistent with  Grade I diastolic dysfunction (impaired relaxation).  2. Right ventricular systolic function is normal. The right ventricular  size is normal.  3. The mitral valve is normal in structure. No evidence of mitral valve  regurgitation. No evidence of mitral stenosis.  4. The aortic valve is normal in structure. Aortic valve regurgitation is  not visualized. No aortic stenosis is present.  5. Aortic dilatation noted. There is mild dilatation of the ascending  aorta, measuring 40 mm.  6. The inferior vena cava is normal in size with greater than 50%  respiratory variability, suggesting right atrial pressure of 3 mmHg.   Physical Exam:    VS:  BP 128/64   Pulse 83   Ht 5\' 3"  (1.6 m)   Wt 114 lb (51.7 kg)   SpO2 99%   BMI 20.19 kg/m     Wt Readings from Last 3  Encounters:  03/15/20 114 lb (51.7 kg)  02/01/20 121 lb 4.1 oz (55 kg)  02/01/19 122 lb 12.8 oz (55.7 kg)     GEN: Elderly female, well developed in no acute distress HEENT: Normal NECK: No JVD; No carotid bruits LYMPHATICS: No lymphadenopathy CARDIAC: RRR, no murmurs, rubs, gallops RESPIRATORY:  Clear to auscultation without rales, wheezing or rhonchi  ABDOMEN: Soft, non-tender, non-distended MUSCULOSKELETAL:  No edema; No deformity  SKIN: Warm and dry NEUROLOGIC:  Alert and oriented x 3 PSYCHIATRIC:  Normal affect   ASSESSMENT:    1. Embolic stroke involving cerebral artery (HCC)  2. Essential hypertension   3. Hyperlipidemia, unspecified hyperlipidemia type   4. Thoracic aortic aneurysm without rupture (HCC)    PLAN:    In order of problems listed above:  Hospitalization for acute, embolic stroke HTN HLD Asymptomatic thoracic/abdominal aortic aneurysm - will continue present statin with LDL< 70 - continue ASA presently - Last at 4.0 cm followed by Dr. Sande Brothers - Will get echocardiogram limited with bubble study - will follow up Preventice monitor (will need it sent from Dr. Cliffton Asters or ordering MD) - will see in 2 months after study is done, and may proceed to TEE based on studies planned for today   Shared Decision Making/Informed Consent      Medication Adjustments/Labs and Tests Ordered: Current medicines are reviewed at length with the patient today.  Concerns regarding medicines are outlined above.  Orders Placed This Encounter  Procedures  . ECHOCARDIOGRAM LIMITED BUBBLE STUDY   No orders of the defined types were placed in this encounter.   Patient Instructions  Medication Instructions:  Your physician recommends that you continue on your current medications as directed. Please refer to the Current Medication list given to you today.  *If you need a refill on your cardiac medications before your next appointment, please call your pharmacy*  Lab  Work: None ordered today  Testing/Procedures: Your physician has requested that you have an limited echocardiogram bubble study. Echocardiography is a painless test that uses sound waves to create images of your heart. It provides your doctor with information about the size and shape of your heart and how well your heart's chambers and valves are working. This procedure takes approximately one hour. There are no restrictions for this procedure.  Follow-Up: At [facility], you and your health needs are our priority.  As part of our continuing mission to provide you with exceptional heart care, we have created designated Provider Care Teams.  These Care Teams include your primary Cardiologist (physician) and Advanced Practice Providers (APPs -  Physician Assistants and Nurse Practitioners) who all work together to provide you with the care you need, when you need it.  Your next appointment:   2 month(s)  The format for your next appointment:   In Person  Provider:   Riley Lam, MD     Signed, Christell Constant, MD  03/15/2020 10:20 AM    [facility]

## 2020-03-21 ENCOUNTER — Other Ambulatory Visit: Payer: Self-pay | Admitting: Student

## 2020-03-21 DIAGNOSIS — I480 Paroxysmal atrial fibrillation: Secondary | ICD-10-CM

## 2020-03-21 DIAGNOSIS — I639 Cerebral infarction, unspecified: Secondary | ICD-10-CM

## 2020-03-26 NOTE — Progress Notes (Signed)
Kindly inform the patient that 30-day heart monitoring study showed no evidence of atrial fibrillation or any worrisome heart arrhythmias and was unremarkable.  Nothing to worry about.

## 2020-03-27 ENCOUNTER — Telehealth: Payer: Self-pay | Admitting: Emergency Medicine

## 2020-03-27 NOTE — Telephone Encounter (Signed)
-----   Message from Micki Riley, MD sent at 03/26/2020  7:19 AM EST ----- Kindly inform the patient that 30-day heart monitoring study showed no evidence of atrial fibrillation or any worrisome heart arrhythmias and was unremarkable.  Nothing to worry about.

## 2020-03-27 NOTE — Telephone Encounter (Signed)
Called patient and discussed Dr. Marlis Edelson findings regarding heart monitoring study.  Patient was very happy, denied any questions and expressed appreciation.

## 2020-04-02 ENCOUNTER — Inpatient Hospital Stay: Payer: Self-pay | Admitting: Neurology

## 2020-04-12 ENCOUNTER — Encounter (HOSPITAL_COMMUNITY): Payer: Self-pay | Admitting: Internal Medicine

## 2020-04-12 ENCOUNTER — Other Ambulatory Visit (HOSPITAL_COMMUNITY): Payer: Medicare Other

## 2020-05-16 ENCOUNTER — Ambulatory Visit: Payer: Medicare Other | Admitting: Internal Medicine

## 2020-05-16 ENCOUNTER — Encounter: Payer: Self-pay | Admitting: Internal Medicine

## 2020-05-16 ENCOUNTER — Other Ambulatory Visit: Payer: Self-pay

## 2020-05-16 ENCOUNTER — Encounter (INDEPENDENT_AMBULATORY_CARE_PROVIDER_SITE_OTHER): Payer: Self-pay

## 2020-05-16 VITALS — BP 130/80 | HR 75 | Ht 64.0 in | Wt 113.0 lb

## 2020-05-16 DIAGNOSIS — I712 Thoracic aortic aneurysm, without rupture, unspecified: Secondary | ICD-10-CM

## 2020-05-16 DIAGNOSIS — I10 Essential (primary) hypertension: Secondary | ICD-10-CM | POA: Diagnosis not present

## 2020-05-16 DIAGNOSIS — E782 Mixed hyperlipidemia: Secondary | ICD-10-CM | POA: Insufficient documentation

## 2020-05-16 DIAGNOSIS — I6340 Cerebral infarction due to embolism of unspecified cerebral artery: Secondary | ICD-10-CM | POA: Diagnosis not present

## 2020-05-16 NOTE — Progress Notes (Signed)
Cardiology Office Note:    Date:  05/16/2020   ID:  Tracey Holmes, DOB [birth year], MRN [id number]  PCP:  Laurann Montana, MD  [facility] Cardiologist:  No primary care provider on file.  [facility] Electrophysiologist:  None   CC:  Follow up stroke evaluation  History of Present Illness:    Tracey Holmes is a 87 y.o. female with a hx of HTN, HLD, TAAA (mild 4.0 cm) followed by Dr. Alla German, who presented after hospitalization 03/15/2020.  Since last visit: no evidence of AF on event monitor. Missed repeat echo appointment; scheduled for 05/22/20.  Patient notes that she is doing well.  Since last visit notes  changes.  Relevant interval testing or therapy include heart monitor but missing her 12/10 echo.  There are no interval hospital/ED visit.    No chest pain or pressure .  No SOB/DOE and no PND/Orthopnea.  No weight gain or leg swelling.  No palpitations or syncope.  Past Medical History:  Diagnosis Date  . Aneurysm, descending thoracic aorta    Proximal (1.5 cm)  . Cystic kidney disease   . Dyslipidemia   . HTN (hypertension)   . Personal history of kidney stones     Past Surgical History:  Procedure Laterality Date  . TOTAL HIP ARTHROPLASTY Right 01/28/2020   Procedure: TOTAL HIP ARTHROPLASTY ANTERIOR APPROACH;  Surgeon: Samson Frederic, MD;  Location: [facility] OR;  Service: Orthopedics;  Laterality: Right;    Current Medications: Current Meds  Medication Sig  . amLODipine (NORVASC) 5 MG tablet Take 5 mg by mouth daily.   Marland Kitchen aspirin EC 81 MG tablet Take 81 mg by mouth in the morning and at bedtime.  . brimonidine (ALPHAGAN) 0.2 % ophthalmic solution Place 1 drop into both eyes 3 (three) times daily.  . Cholecalciferol (VITAMIN D) 2000 UNITS tablet Take 4,000 Units by mouth daily.  Marland Kitchen denosumab (PROLIA) 60 MG/ML SOLN injection Inject 60 mg into the skin every 6 (six) months. Administer in upper arm, thigh, or abdomen  . dorzolamide-timolol (COSOPT) 22.3-6.8 MG/ML  ophthalmic solution dorzolamide 22.3 mg-timolol 6.8 mg/mL eye drops  . lactose free nutrition (BOOST) LIQD Take 237 mLs by mouth 3 (three) times daily between meals.  Marland Kitchen loratadine (CLARITIN) 10 MG tablet 1 tablet  . Magnesium 400 MG TABS   . Multiple Vitamin (MULTIVITAMIN WITH MINERALS) TABS tablet Take 1 tablet by mouth daily.  . Netarsudil-Latanoprost (ROCKLATAN) 0.02-0.005 % SOLN 1 drop into affected eye  . niacin 250 MG tablet 1 tablet  . Nutritional Supplements (CARNATION BREAKFAST ESSENTIALS) PACK Take by mouth.  . polyethylene glycol (MIRALAX / GLYCOLAX) 17 g packet Take 17 g by mouth daily as needed for mild constipation.  . pravastatin (PRAVACHOL) 80 MG tablet Take 1 tablet by mouth every evening.     Allergies:   Benicar [olmesartan], Diovan [valsartan], and Penicillins   Social History   Socioeconomic History  . Marital status: Single    Spouse name: Not on file  . Number of children: Not on file  . Years of education: Not on file  . Highest education level: Not on file  Occupational History  . Not on file  Tobacco Use  . Smoking status: Never Smoker  . Smokeless tobacco: Never Used  Substance and Sexual Activity  . Alcohol use: No  . Drug use: No  . Sexual activity: Not on file  Other Topics Concern  . Not on file  Social History Narrative  . Not on file  Social Determinants of Health   Financial Resource Strain: Not on file  Food Insecurity: Not on file  Transportation Needs: Not on file  Physical Activity: Not on file  Stress: Not on file  Social Connections: Not on file     Family History: The patient's family history is negative for AF.  ROS:   Please see the history of present illness.    All other systems reviewed and are negative.  EKGs/Labs/Other Studies Reviewed:    The following studies were reviewed today:  EKG:   01/30/20- SR 90 low voltage precordial  leads  Cardiac Event Monitoring: Date: 03/21/20 Results:  Patient had an avg  HR of 81 bpm.  Predominant underlying rhythm was sinus rhythm.  Isolated PACs were rare (<1.0%).  Isolated PVCs were rare (<1.0%).  No evidence of atrial fibrillation.  Triggered and diary events associated with sinus rhythm.   No malignant arrhythmias or evidence of atrial fibrillation.  Transthoracic Echocardiogram: Date:01/31/20 Results: 1. There is acceleration of flow through left ventricular outflow tract.  Non obstructive. Left ventricular ejection fraction, by estimation, is 65  to 70%. The left ventricle has normal function. The left ventricle has no  regional wall motion  abnormalities. Left ventricular diastolic parameters are consistent with  Grade I diastolic dysfunction (impaired relaxation).  2. Right ventricular systolic function is normal. The right ventricular  size is normal.  3. The mitral valve is normal in structure. No evidence of mitral valve  regurgitation. No evidence of mitral stenosis.  4. The aortic valve is normal in structure. Aortic valve regurgitation is  not visualized. No aortic stenosis is present.  5. Aortic dilatation noted. There is mild dilatation of the ascending  aorta, measuring 40 mm.  6. The inferior vena cava is normal in size with greater than 50%  respiratory variability, suggesting right atrial pressure of 3 mmHg.   Recent Labs: 01/27/2020: Magnesium 2.1; TSH 1.039 01/31/2020: ALT 24 02/02/2020: BUN 22; Creatinine, Ser 0.56; Hemoglobin 8.6; Platelets 264; Potassium 3.5; Sodium 139  Recent Lipid Panel    Component Value Date/Time   CHOL 135 01/31/2020 0656   TRIG 84 01/31/2020 0656   HDL 45 01/31/2020 0656   CHOLHDL 3.0 01/31/2020 0656   VLDL 17 01/31/2020 0656   LDLCALC 73 01/31/2020 0656   OSH labs A1c 5.9 01/31/20 BNP 03/07/20- 357 Hgb 14.6, Platelet 307, Wbc 8.3 Creatinine 0.88  Physical Exam:    VS:  BP 130/80   Pulse 75   Ht 5\' 4"  (1.626 m)   Wt 113 lb (51.3 kg)   SpO2 99%   BMI 19.40 kg/m     Wt  Readings from Last 3 Encounters:  05/16/20 113 lb (51.3 kg)  03/15/20 114 lb (51.7 kg)  02/01/20 121 lb 4.1 oz (55 kg)     GEN: Elderly female, well developed in no acute distress HEENT: Normal NECK: No JVD; No carotid bruits LYMPHATICS: No lymphadenopathy CARDIAC: RRR, no murmurs, rubs, gallops RESPIRATORY:  Good air movement with inspiratory rhonchi ABDOMEN: Soft, non-tender, non-distended MUSCULOSKELETAL:  No edema; No deformity  SKIN: Warm and dry NEUROLOGIC:  Alert and oriented x 3 PSYCHIATRIC:  Normal affect   ASSESSMENT:    1. Embolic stroke involving cerebral artery (HCC)   2. Essential hypertension   3. Mixed hyperlipidemia   4. Thoracic aortic aneurysm without rupture (HCC)    PLAN:    In order of problems listed above:  Embolic Stroke - pending bubble study.  - discussed with patient  and her friend that if we have a positive bubble study that we will need to perform a TEE for evaluation of paradoxical emboli.   After careful review of history and examination, the risks and benefits of transesophageal echocardiogram have been explained including risks of esophageal damage, perforation (1:10,000 risk), bleeding, pharyngeal hematoma as well as other potential complications associated with conscious sedation including aspiration, arrhythmia, respiratory failure and death. Alternatives to treatment were discussed, questions were answered. Patient is willing to proceed.   Essential Hypertension, - At goal, continue home medications - discussed diet (DASH/low sodium), and exercise/weight loss interventions   Hyperlipidemia (mixed) -LDL goal less than 70, near goal -continue current statin - gave education on dietary changes  Asymptomatic thoracic aortic aneurysm - continue ASA presently - Last at 4.0 cm followed by Dr. Sande Brothers  Shared Decision Making/Informed Consent The risks [esophageal damage, perforation (1:10,000 risk), bleeding, pharyngeal hematoma as well  as other potential complications associated with conscious sedation including aspiration, arrhythmia, respiratory failure and death], benefits (treatment guidance and diagnostic support) and alternatives of a transesophageal echocardiogram were discussed in detail with Tracey Holmes and she is willing to proceed.   Six month follow up unless new symptoms or abnormal test results warranting change in plan.  If positive bubble study, patient has been seen and consented for TEE and has no further questions.  Would be reasonable for  Virtual Follow up Would be reasonable for  APP Follow up    Medication Adjustments/Labs and Tests Ordered: Current medicines are reviewed at length with the patient today.  Concerns regarding medicines are outlined above.  No orders of the defined types were placed in this encounter.  No orders of the defined types were placed in this encounter.   Patient Instructions  Medication Instructions:  *If you need a refill on your cardiac medications before your next appointment, please call your pharmacy*  Follow-Up: At [facility], you and your health needs are our priority.  As part of our continuing mission to provide you with exceptional heart care, we have created designated Provider Care Teams.  These Care Teams include your primary Cardiologist (physician) and Advanced Practice Providers (APPs -  Physician Assistants and Nurse Practitioners) who all work together to provide you with the care you need, when you need it.  We recommend signing up for the patient portal called "MyChart".  Sign up information is provided on this After Visit Summary.  MyChart is used to connect with patients for Virtual Visits (Telemedicine).  Patients are able to view lab/test results, encounter notes, upcoming appointments, etc.  Non-urgent messages can be sent to your provider as well.   To learn more about what you can do with MyChart, go to [email or web address].    Your next  appointment:   Your physician recommends that you schedule a follow-up appointment in: 6 MONTHS with Dr. Izora Ribas  The format for your next appointment:   In Person with Riley Lam, MD   -- Please keep your upcoming echocardiogram appointment. If you echocardiogram is positive you will be contacted to schedule a TEE procedure. --      Signed, Christell Constant, MD  05/16/2020 11:55 AM    [facility]

## 2020-05-16 NOTE — Patient Instructions (Signed)
Medication Instructions:  *If you need a refill on your cardiac medications before your next appointment, please call your pharmacy*  Follow-Up: At [facility], you and your health needs are our priority.  As part of our continuing mission to provide you with exceptional heart care, we have created designated Provider Care Teams.  These Care Teams include your primary Cardiologist (physician) and Advanced Practice Providers (APPs -  Physician Assistants and Nurse Practitioners) who all work together to provide you with the care you need, when you need it.  We recommend signing up for the patient portal called "MyChart".  Sign up information is provided on this After Visit Summary.  MyChart is used to connect with patients for Virtual Visits (Telemedicine).  Patients are able to view lab/test results, encounter notes, upcoming appointments, etc.  Non-urgent messages can be sent to your provider as well.   To learn more about what you can do with MyChart, go to [email or web address].    Your next appointment:   Your physician recommends that you schedule a follow-up appointment in: 6 MONTHS with Dr. Izora Ribas  The format for your next appointment:   In Person with Riley Lam, MD   -- Please keep your upcoming echocardiogram appointment. If you echocardiogram is positive you will be contacted to schedule a TEE procedure. --

## 2020-05-20 ENCOUNTER — Telehealth: Payer: Self-pay | Admitting: Internal Medicine

## 2020-05-20 NOTE — Telephone Encounter (Signed)
    Pt would like to r/s her echo bubble due to weather

## 2020-05-22 ENCOUNTER — Other Ambulatory Visit (HOSPITAL_COMMUNITY): Payer: Medicare Other

## 2020-06-06 ENCOUNTER — Other Ambulatory Visit: Payer: Self-pay | Admitting: *Deleted

## 2020-06-06 DIAGNOSIS — I712 Thoracic aortic aneurysm, without rupture, unspecified: Secondary | ICD-10-CM

## 2020-06-11 ENCOUNTER — Ambulatory Visit (HOSPITAL_COMMUNITY): Payer: Medicare Other | Attending: Internal Medicine

## 2020-06-11 ENCOUNTER — Other Ambulatory Visit: Payer: Self-pay

## 2020-06-11 DIAGNOSIS — I6340 Cerebral infarction due to embolism of unspecified cerebral artery: Secondary | ICD-10-CM | POA: Diagnosis not present

## 2020-06-11 DIAGNOSIS — I10 Essential (primary) hypertension: Secondary | ICD-10-CM | POA: Diagnosis not present

## 2020-06-11 DIAGNOSIS — E785 Hyperlipidemia, unspecified: Secondary | ICD-10-CM | POA: Diagnosis not present

## 2020-06-11 DIAGNOSIS — I351 Nonrheumatic aortic (valve) insufficiency: Secondary | ICD-10-CM | POA: Diagnosis not present

## 2020-06-11 DIAGNOSIS — Q211 Atrial septal defect: Secondary | ICD-10-CM | POA: Insufficient documentation

## 2020-06-11 LAB — ECHOCARDIOGRAM LIMITED BUBBLE STUDY
Area-P 1/2: 3.72 cm2
S' Lateral: 2 cm

## 2020-06-12 ENCOUNTER — Encounter: Payer: Self-pay | Admitting: Neurology

## 2020-06-12 ENCOUNTER — Ambulatory Visit: Payer: Medicare Other | Admitting: Neurology

## 2020-06-12 VITALS — BP 145/89 | HR 82 | Ht 62.0 in | Wt 114.8 lb

## 2020-06-12 DIAGNOSIS — I6310 Cerebral infarction due to embolism of unspecified precerebral artery: Secondary | ICD-10-CM

## 2020-06-12 DIAGNOSIS — Q211 Atrial septal defect: Secondary | ICD-10-CM | POA: Diagnosis not present

## 2020-06-12 DIAGNOSIS — G3184 Mild cognitive impairment, so stated: Secondary | ICD-10-CM

## 2020-06-12 DIAGNOSIS — R413 Other amnesia: Secondary | ICD-10-CM

## 2020-06-12 DIAGNOSIS — Q2112 Patent foramen ovale: Secondary | ICD-10-CM

## 2020-06-12 MED ORDER — PREVAGEN 10 MG PO CAPS: 1.0000 | ORAL_CAPSULE | ORAL | 1 refills | Status: AC

## 2020-06-12 NOTE — Progress Notes (Signed)
[facility] [address] [place] [place] [address] [phone]       OFFICE FOLLOW-UP NOTE  [name] Date of Birth:  Jan 23, 1934 Medical Record Number:  [id number]   HPI: [name] is a pleasant 87 year old Caucasian lady seen today for initial office follow-up visit following hospital consultation for strokes in September 2021.  History is obtained from the patient review of electronic medical records and I personally reviewed imaging films in PACS.  She has past medical history of hypertension, hyperlipidemia, cystic kidney disease and aneurysm of the descending aorta.  She was admitted for right femoral fracture and underwent total hip replacement in 01/30/2020 postoperatively she developed sudden onset of acute aphasia while eating breakfast her speech became gibberish and not making sense and code stroke was called.  She is not a TPA candidate due to recent surgery.  NIH stroke scale was 8.  CT angiogram showed minimum atherosclerotic changes in the head and neck without large vessel occlusion.  MRI scan showed left greater than right bilateral multifocal small infarcts largest 1 in the left frontal operculum with some petechial hemorrhage.  2D echo showed normal ejection fraction without cardiac source of embolism.  LDL cholesterol 73 mg percent.  Hemoglobin A1c is 5.6.  Transcranial Doppler bubble study suggested right to left shunt and this was confirmed on 2D echo performed yesterday.  Lower extremity venous Dopplers however were negative for DVT.  Patient is advised to undergo 30-day external heart monitor which she did and no paroxysmal A. fib was found.  She is currently on aspirin which is tolerating well without bleeding or bruising.  Her blood pressure is quite well controlled at home though today it is borderline in office at 145/89.  Patient has been able to ambulate initially with a walker and now she can walk short distances indoors without it also.   She is able to bear full weight on the right leg now.  She states her balance is good she has had no falls.  She states her speech has improved mostly though she still have some word hesitancy but she is more concerned about her short-term memory not being good.  She cannot remember recent information and at times she has to stop midsentence trying to find words.  She has no family history of dementia or Alzheimer's.  She had no prior strokes.  She lives alone and is independent.  ROS:   14 system review of systems is positive for memory loss, difficulty multitasking, gait difficulty, imbalance, word finding difficulty and all other systems negative PMH:  Past Medical History:  Diagnosis Date  . Aneurysm, descending thoracic aorta    Proximal (1.5 cm)  . Cystic kidney disease   . Dyslipidemia   . HTN (hypertension)   . Personal history of kidney stones   . Stroke [facility]) 924/21    Social History:  Social History   Socioeconomic History  . Marital status: Single    Spouse name: Not on file  . Number of children: Not on file  . Years of education: Not on file  . Highest education level: Not on file  Occupational History  . Not on file  Tobacco Use  . Smoking status: Never Smoker  . Smokeless tobacco: Never Used  Substance and Sexual Activity  . Alcohol use: No  . Drug use: No  . Sexual activity: Not on file  Other Topics Concern  . Not on file  Social History Narrative   Lives  alone   Right Handed   Drinks 1 cup caffeine daily   Social Determinants of Health   Financial Resource Strain: Not on file  Food Insecurity: Not on file  Transportation Needs: Not on file  Physical Activity: Not on file  Stress: Not on file  Social Connections: Not on file  Intimate Partner Violence: Not on file    Medications:   Current Outpatient Medications on File Prior to Visit  Medication Sig Dispense Refill  . amLODipine (NORVASC) 5 MG tablet Take 5 mg by mouth daily.     Marland Kitchen aspirin EC  81 MG tablet Take 81 mg by mouth in the morning and at bedtime.    . brimonidine (ALPHAGAN) 0.2 % ophthalmic solution Place 1 drop into both eyes 3 (three) times daily.    . Cholecalciferol (VITAMIN D) 2000 UNITS tablet Take 4,000 Units by mouth daily.    Marland Kitchen denosumab (PROLIA) 60 MG/ML SOLN injection Inject 60 mg into the skin every 6 (six) months. Administer in upper arm, thigh, or abdomen    . dorzolamide-timolol (COSOPT) 22.3-6.8 MG/ML ophthalmic solution dorzolamide 22.3 mg-timolol 6.8 mg/mL eye drops    . lactose free nutrition (BOOST) LIQD Take 237 mLs by mouth 3 (three) times daily between meals.    Marland Kitchen loratadine (CLARITIN) 10 MG tablet 1 tablet    . Magnesium 400 MG TABS     . Multiple Vitamin (MULTIVITAMIN WITH MINERALS) TABS tablet Take 1 tablet by mouth daily.    . Netarsudil-Latanoprost (ROCKLATAN) 0.02-0.005 % SOLN 1 drop into affected eye    . niacin 250 MG tablet 1 tablet    . Nutritional Supplements (CARNATION BREAKFAST ESSENTIALS) PACK Take by mouth.    . polyethylene glycol (MIRALAX / GLYCOLAX) 17 g packet Take 17 g by mouth daily as needed for mild constipation. 14 each 0  . pravastatin (PRAVACHOL) 80 MG tablet Take 1 tablet by mouth every evening.     No current facility-administered medications on file prior to visit.    Allergies:   Allergies  Allergen Reactions  . Benicar [Olmesartan]     rash  . Diovan [Valsartan]     Rash  . Penicillins Rash    Physical Exam General: Frail elderly Caucasian lady, seated, in no evident distress Head: head normocephalic and atraumatic.  Neck: supple with no carotid or supraclavicular bruits Cardiovascular: regular rate and rhythm, no murmurs Musculoskeletal: no deformity Skin:  no rash/petichiae Vascular:  Normal pulses all extremities Vitals:   06/12/20 1109  BP: (!) 145/89  Pulse: 82   Neurologic Exam Mental Status: Awake and fully alert. Oriented to place and time. Recent and remote memory intact. Attention span,  concentration and fund of knowledge appropriate. Mood and affect appropriate.  Diminished recall 1/3.  Able to name only 4 animals which can walk on 4 legs.  Clock drawing 4/4. Cranial Nerves: Fundoscopic exam reveals sharp disc margins. Pupils equal, briskly reactive to light. Extraocular movements full without nystagmus. Visual fields full to confrontation. Hearing diminished bilaterally. Facial sensation intact. Face, tongue, palate moves normally and symmetrically.  Motor: Normal bulk and tone. Normal strength in all tested extremity muscles.  Diminished fine finger movements on the right.  Orbits left over right upper extremity. Sensory.: intact to touch ,pinprick .position and vibratory sensation.  Coordination: Rapid alternating movements normal in all extremities. Finger-to-nose and heel-to-shin performed accurately bilaterally. Gait and Station: Arises from chair without difficulty. Stance is stooped.  Uses a walker with slightly broad-based ataxic gait.  Reflexes: 1+ and symmetric. Toes downgoing.   NIHSS  0 Modified Rankin  2   ASSESSMENT: 87 year old Caucasian lady with bilateral embolic infarcts following hip surgery in September 2021 from which she is made good physical recovery but does have memory loss and mild cognitive impairment.  She also has a patent foramen ovale which may have contributed to paradoxical embolism in her stroke but given advanced age and low ROPE score needs to be managed conservatively     PLAN: I had a long d/w patient about her recent strokes and memory loss, risk for recurrent stroke/TIAs, personally independently reviewed imaging studies and stroke evaluation results and answered questions.Continue aspirin 81 mg daily  for secondary stroke prevention and maintain strict control of hypertension with blood pressure goal below 130/90, diabetes with hemoglobin A1c goal below 6.5% and lipids with LDL cholesterol goal below 70 mg/dL. I also advised the patient  to eat a healthy diet with plenty of whole grains, cereals, fruits and vegetables, exercise regularly and maintain ideal body weight.  Recommend conservative follow-up for her PFO given her..  I recommend we check memory panel labs and EEG.  She was advised to start taking Privigen 10 mg capsule daily for her memory as well as increase participation in cognitively challenging activities like solving crossword puzzles, playing bridge and sodoku.  We also discussed fall prevention precautions.  Followup in the future with my nurse practitioner Shanda Bumps in 3 months or call earlier if necessary. Greater than 50% of time during this 25 minute visit was spent on counseling,explanation of diagnosis of embolic strokes and PFO, planning of further management, discussion with patient and family and coordination of care Delia Heady, MD  [facility] [address] [place], [place] [address]  Phone [phone] Fax [phone] Note: This document was prepared with digital dictation and possible smart phrase technology. Any transcriptional errors that result from this process are unintentional

## 2020-06-12 NOTE — Patient Instructions (Signed)
I had a long d/w patient about her recent strokes and memory loss, risk for recurrent stroke/TIAs, personally independently reviewed imaging studies and stroke evaluation results and answered questions.Continue aspirin 81 mg daily  for secondary stroke prevention and maintain strict control of hypertension with blood pressure goal below 130/90, diabetes with hemoglobin A1c goal below 6.5% and lipids with LDL cholesterol goal below 70 mg/dL. I also advised the patient to eat a healthy diet with plenty of whole grains, cereals, fruits and vegetables, exercise regularly and maintain ideal body weight.  I recommend we check memory panel labs and EEG.  She was advised to start taking Privigen 10 mg capsule daily for her memory as well as increase participation in cognitively challenging activities like solving crossword puzzles, playing bridge and sodoku.  We also discussed fall prevention precautions.  Followup in the future with my nurse practitioner Shanda Bumps in 3 months or call earlier if necessary. Memory Compensation Strategies  1. Use "WARM" strategy.  W= write it down  A= associate it  R= repeat it  M= make a mental note  2.   You can keep a Glass blower/designer.  Use a 3-ring notebook with sections for the following: calendar, important names and phone numbers,  medications, doctors' names/phone numbers, lists/reminders, and a section to journal what you did  each day.   3.    Use a calendar to write appointments down.  4.    Write yourself a schedule for the day.  This can be placed on the calendar or in a separate section of the Memory Notebook.  Keeping a  regular schedule can help memory.  5.    Use medication organizer with sections for each day or morning/evening pills.  You may need help loading it  6.    Keep a basket, or pegboard by the door.  Place items that you need to take out with you in the basket or on the pegboard.  You may also want to  include a message board for reminders.  7.     Use sticky notes.  Place sticky notes with reminders in a place where the task is performed.  For example: " turn off the  stove" placed by the stove, "lock the door" placed on the door at eye level, " take your medications" on  the bathroom mirror or by the place where you normally take your medications.  8.    Use alarms/timers.  Use while cooking to remind yourself to check on food or as a reminder to take your medicine, or as a  reminder to make a call, or as a reminder to perform another task, etc.  Fall Prevention in the Home, Adult Falls can cause injuries and can happen to people of all ages. There are many things you can do to make your home safe and to help prevent falls. Ask for help when making these changes. What actions can I take to prevent falls? General Instructions  Use good lighting in all rooms. Replace any light bulbs that burn out.  Turn on the lights in dark areas. Use night-lights.  Keep items that you use often in easy-to-reach places. Lower the shelves around your home if needed.  Set up your furniture so you have a clear path. Avoid moving your furniture around.  Do not have throw rugs or other things on the floor that can make you trip.  Avoid walking on wet floors.  If any of your floors are uneven, fix them.  Add color or contrast paint or tape to clearly mark and help you see: ? Grab bars or handrails. ? First and last steps of staircases. ? Where the edge of each step is.  If you use a stepladder: ? Make sure that it is fully opened. Do not climb a closed stepladder. ? Make sure the sides of the stepladder are locked in place. ? Ask someone to hold the stepladder while you use it.  Know where your pets are when moving through your home. What can I do in the bathroom?  Keep the floor dry. Clean up any water on the floor right away.  Remove soap buildup in the tub or shower.  Use nonskid mats or decals on the floor of the tub or  shower.  Attach bath mats securely with double-sided, nonslip rug tape.  If you need to sit down in the shower, use a plastic, nonslip stool.  Install grab bars by the toilet and in the tub and shower. Do not use towel bars as grab bars.      What can I do in the bedroom?  Make sure that you have a light by your bed that is easy to reach.  Do not use any sheets or blankets for your bed that hang to the floor.  Have a firm chair with side arms that you can use for support when you get dressed. What can I do in the kitchen?  Clean up any spills right away.  If you need to reach something above you, use a step stool with a grab bar.  Keep electrical cords out of the way.  Do not use floor polish or wax that makes floors slippery. What can I do with my stairs?  Do not leave any items on the stairs.  Make sure that you have a light switch at the top and the bottom of the stairs.  Make sure that there are handrails on both sides of the stairs. Fix handrails that are broken or loose.  Install nonslip stair treads on all your stairs.  Avoid having throw rugs at the top or bottom of the stairs.  Choose a carpet that does not hide the edge of the steps on the stairs.  Check carpeting to make sure that it is firmly attached to the stairs. Fix carpet that is loose or worn. What can I do on the outside of my home?  Use bright outdoor lighting.  Fix the edges of walkways and driveways and fix any cracks.  Remove anything that might make you trip as you walk through a door, such as a raised step or threshold.  Trim any bushes or trees on paths to your home.  Check to see if handrails are loose or broken and that both sides of all steps have handrails.  Install guardrails along the edges of any raised decks and porches.  Clear paths of anything that can make you trip, such as tools or rocks.  Have leaves, snow, or ice cleared regularly.  Use sand or salt on paths during  winter.  Clean up any spills in your garage right away. This includes grease or oil spills. What other actions can I take?  Wear shoes that: ? Have a low heel. Do not wear high heels. ? Have rubber bottoms. ? Feel good on your feet and fit well. ? Are closed at the toe. Do not wear open-toe sandals.  Use tools that help you move around if needed. These include: ?  Canes. ? Walkers. ? Scooters. ? Crutches.  Review your medicines with your doctor. Some medicines can make you feel dizzy. This can increase your chance of falling. Ask your doctor what else you can do to help prevent falls. Where to find more information  Centers for Disease Control and Prevention, STEADI: FootballExhibition.com.br  [employer] on Aging: https://walker.com/ Contact a doctor if:  You are afraid of falling at home.  You feel weak, drowsy, or dizzy at home.  You fall at home. Summary  There are many simple things that you can do to make your home safe and to help prevent falls.  Ways to make your home safe include removing things that can make you trip and installing grab bars in the bathroom.  Ask for help when making these changes in your home. This information is not intended to replace advice given to you by your health care provider. Make sure you discuss any questions you have with your health care provider. Document Revised: 11/22/2019 Document Reviewed: 11/22/2019 Elsevier Patient Education  2021 [employer].

## 2020-06-13 LAB — DEMENTIA PANEL
Homocysteine: 10.4 umol/L (ref 0.0–21.3)
RPR Ser Ql: NONREACTIVE
TSH: 1.59 u[IU]/mL (ref 0.450–4.500)
Vitamin B-12: 675 pg/mL (ref 232–1245)

## 2020-06-18 NOTE — Progress Notes (Signed)
Kindly inform the patient that lab work for reversible causes of memory loss was all normal

## 2020-06-19 ENCOUNTER — Other Ambulatory Visit: Payer: Self-pay

## 2020-06-19 ENCOUNTER — Encounter: Payer: Self-pay | Admitting: Internal Medicine

## 2020-06-19 ENCOUNTER — Ambulatory Visit: Payer: Medicare Other | Admitting: Internal Medicine

## 2020-06-19 VITALS — BP 142/74 | HR 80 | Ht 63.0 in | Wt 106.0 lb

## 2020-06-19 DIAGNOSIS — I10 Essential (primary) hypertension: Secondary | ICD-10-CM

## 2020-06-19 DIAGNOSIS — Q211 Atrial septal defect: Secondary | ICD-10-CM | POA: Diagnosis not present

## 2020-06-19 DIAGNOSIS — E782 Mixed hyperlipidemia: Secondary | ICD-10-CM | POA: Diagnosis not present

## 2020-06-19 DIAGNOSIS — I712 Thoracic aortic aneurysm, without rupture, unspecified: Secondary | ICD-10-CM

## 2020-06-19 DIAGNOSIS — Q2112 Patent foramen ovale: Secondary | ICD-10-CM

## 2020-06-19 DIAGNOSIS — I6340 Cerebral infarction due to embolism of unspecified cerebral artery: Secondary | ICD-10-CM | POA: Diagnosis not present

## 2020-06-19 NOTE — Progress Notes (Signed)
Cardiology Office Note:    Date:  06/19/2020   ID:  Tracey Holmes, DOB 10/05/1933, MRN [id number]  PCP:  White, Cynthia, MD  [facility] Cardiologist:  No primary care provider on file.  [facility] Electrophysiologist:  None   CC:  Follow up stroke evaluation  History of Present Illness:    Tracey Holmes is a 87 y.o. female with a hx of HTN, HLD, TAAA (mild 4.0 cm) followed by Dr. VanTright, who presented after hospitalization 03/15/2020.  Since last visit: no evidence of AF on event monitor. Missed repeat echo appointment; scheduled for 05/22/20. In interim of this visit, patient had positive bubble study.    Patient notes that she is doing well.  Since last visit notes no changes.  Relevant interval testing or therapy include positive bubble study.  There are no interval hospital/ED visit.    No chest pain or pressure .  No SOB/DOE and no PND/Orthopnea.  No weight gain or leg swelling.  No palpitations or syncope .  No dysphagia or swallowing issues.  Past Medical History:  Diagnosis Date  . Aneurysm, descending thoracic aorta    Proximal (1.5 cm)  . Cystic kidney disease   . Dyslipidemia   . HTN (hypertension)   . Personal history of kidney stones   . Stroke (HCC) 924/21    Past Surgical History:  Procedure Laterality Date  . TOTAL HIP ARTHROPLASTY Right 01/28/2020   Procedure: TOTAL HIP ARTHROPLASTY ANTERIOR APPROACH;  Surgeon: Swinteck, Brian, MD;  Location: [facility] OR;  Service: Orthopedics;  Laterality: Right;    Current Medications: Current Meds  Medication Sig  . amLODipine (NORVASC) 5 MG tablet Take 5 mg by mouth daily.   . aspirin EC 81 MG tablet Take 81 mg by mouth in the morning and at bedtime.  . brimonidine (ALPHAGAN) 0.2 % ophthalmic solution Place 1 drop into both eyes 3 (three) times daily.  . Cholecalciferol (VITAMIN D) 2000 UNITS tablet Take 4,000 Units by mouth daily.  . denosumab (PROLIA) 60 MG/ML SOLN injection Inject 60 mg into the skin every  6 (six) months. Administer in upper arm, thigh, or abdomen  . dorzolamide-timolol (COSOPT) 22.3-6.8 MG/ML ophthalmic solution dorzolamide 22.3 mg-timolol 6.8 mg/mL eye drops  . lactose free nutrition (BOOST) LIQD Take 237 mLs by mouth 3 (three) times daily between meals.  . loratadine (CLARITIN) 10 MG tablet 1 tablet  . Magnesium 400 MG TABS   . Multiple Vitamin (MULTIVITAMIN WITH MINERALS) TABS tablet Take 1 tablet by mouth daily.  . Netarsudil-Latanoprost (ROCKLATAN) 0.02-0.005 % SOLN 1 drop into affected eye  . niacin 250 MG tablet 1 tablet  . Nutritional Supplements (CARNATION BREAKFAST ESSENTIALS) PACK Take by mouth.  . polyethylene glycol (MIRALAX / GLYCOLAX) 17 g packet Take 17 g by mouth daily as needed for mild constipation.  . pravastatin (PRAVACHOL) 80 MG tablet Take 1 tablet by mouth every evening.     Allergies:   Benicar [olmesartan], Diovan [valsartan], and Penicillins   Social History   Socioeconomic History  . Marital status: Single    Spouse name: Not on file  . Number of children: Not on file  . Years of education: Not on file  . Highest education level: Not on file  Occupational History  . Not on file  Tobacco Use  . Smoking status: Never Smoker  . Smokeless tobacco: Never Used  Substance and Sexual Activity  . Alcohol use: No  . Drug use: No  . Sexual activity: Not   on file  Other Topics Concern  . Not on file  Social History Narrative   Lives alone   Right Handed   Drinks 1 cup caffeine daily   Social Determinants of Health   Financial Resource Strain: Not on file  Food Insecurity: Not on file  Transportation Needs: Not on file  Physical Activity: Not on file  Stress: Not on file  Social Connections: Not on file     Family History: The patient's family history is negative for AF.  ROS:   Please see the history of present illness.    All other systems reviewed and are negative.  EKGs/Labs/Other Studies Reviewed:    The following studies  were reviewed today:  EKG:   01/30/20- SR 90 low voltage precordial  leads  Cardiac Event Monitoring: Date: 03/21/20 Results:  Patient had an avg HR of 81 bpm.  Predominant underlying rhythm was sinus rhythm.  Isolated PACs were rare (<1.0%).  Isolated PVCs were rare (<1.0%).  No evidence of atrial fibrillation.  Triggered and diary events associated with sinus rhythm.   No malignant arrhythmias or evidence of atrial fibrillation.  Transthoracic Echocardiogram: Date:01/31/20 Results: 1. There is acceleration of flow through left ventricular outflow tract.  Non obstructive. Left ventricular ejection fraction, by estimation, is 65  to 70%. The left ventricle has normal function. The left ventricle has no  regional wall motion  abnormalities. Left ventricular diastolic parameters are consistent with  Grade I diastolic dysfunction (impaired relaxation).  2. Right ventricular systolic function is normal. The right ventricular  size is normal.  3. The mitral valve is normal in structure. No evidence of mitral valve  regurgitation. No evidence of mitral stenosis.  4. The aortic valve is normal in structure. Aortic valve regurgitation is  not visualized. No aortic stenosis is present.  5. Aortic dilatation noted. There is mild dilatation of the ascending  aorta, measuring 40 mm.  6. The inferior vena cava is normal in size with greater than 50%  respiratory variability, suggesting right atrial pressure of 3 mmHg.   Limited Repeat 06/11/20: 1. Left ventricular ejection fraction, by estimation, is 65 to 70%. The  left ventricle has normal function. The left ventricle has no regional  wall motion abnormalities.  2. The aortic valve is tricuspid. Aortic valve regurgitation is trivial.  3. Aortic dilatation noted. There is mild dilatation of the ascending  aorta, measuring 40 mm.  4. Evidence of atrial level shunting detected by color flow Doppler.  Agitated saline  contrast bubble study was positive with shunting observed  within 3-6 cardiac cycles suggestive of interatrial shunt. There is a  moderately sized patent foramen ovale  with predominantly right to left shunting across the atrial septum.  Conclusion(s)/Recommendation(s): Moderate-sized PFO wtih right to left  flow is noted.   Recent Labs: 01/27/2020: Magnesium 2.1 01/31/2020: ALT 24 02/02/2020: BUN 22; Creatinine, Ser 0.56; Hemoglobin 8.6; Platelets 264; Potassium 3.5; Sodium 139 06/12/2020: TSH 1.590  Recent Lipid Panel    Component Value Date/Time   CHOL 135 01/31/2020 0656   TRIG 84 01/31/2020 0656   HDL 45 01/31/2020 0656   CHOLHDL 3.0 01/31/2020 0656   VLDL 17 01/31/2020 0656   LDLCALC 73 01/31/2020 0656   OSH labs A1c 5.9 01/31/20 BNP 03/07/20- 357 Hgb 14.6, Platelet 307, Wbc 8.3 Creatinine 0.88  Physical Exam:    VS:  BP (!) 142/74   Pulse 80   Ht 5\' 3"  (1.6 m)   Wt 106 lb (48.1 kg)  SpO2 98%   BMI 18.78 kg/m     Wt Readings from Last 3 Encounters:  06/19/20 106 lb (48.1 kg)  06/12/20 114 lb 12.8 oz (52.1 kg)  05/16/20 113 lb (51.3 kg)     GEN: Elderly female, well developed in no acute distress HEENT: Mallampati II; no hearing aids today, no loose teeth NECK: No JVD; No carotid bruits LYMPHATICS: No lymphadenopathy CARDIAC: RRR, no murmurs, rubs, gallops RESPIRATORY:  Good air movement with inspiratory rhonchi ABDOMEN: Soft, non-tender, non-distended MUSCULOSKELETAL:  No edema; No deformity  SKIN: Warm and dry NEUROLOGIC:  Alert and oriented x 3 PSYCHIATRIC:  Normal affect   ASSESSMENT:    1. PFO (patent foramen ovale)   2. Embolic stroke involving cerebral artery (HCC)   3. Mixed hyperlipidemia   4. Essential hypertension   5. Thoracic aortic aneurysm without rupture (HCC)    PLAN:    In order of problems listed above:  Embolic Stroke - pending bubble study.    Essential Hypertension, - At goal, continue home medications - discussed diet  (DASH/low sodium), and exercise/weight loss interventions   Hyperlipidemia (mixed) -LDL goal less than 70, near goal -continue current statin - gave education on dietary changes  Asymptomatic thoracic aortic aneurysm - continue ASA presently - Last at 4.0 cm followed by CT Surgery - Will evaluation with TEE - Discussed the risks and benefits of imaging with no plans for future open heart surgery; will defer definitive therapy until after imaging testing Likely will see structural imaging team after procedure.  Five months (and TEE)  follow up unless new symptoms or abnormal test results warranting change in plan (or turned down by structural team)  Would be reasonable for  APP Follow up   Shared Decision Making/Informed Consent The risks [esophageal damage, perforation (1:10,000 risk), bleeding, pharyngeal hematoma as well as other potential complications associated with conscious sedation including aspiration, arrhythmia, respiratory failure and death], benefits (treatment guidance and diagnostic support) and alternatives of a transesophageal echocardiogram were discussed in detail with [name] and she is willing to proceed.    Medication Adjustments/Labs and Tests Ordered: Current medicines are reviewed at length with the patient today.  Concerns regarding medicines are outlined above.  No orders of the defined types were placed in this encounter.  No orders of the defined types were placed in this encounter.   Patient Instructions  Medication Instructions:  Your physician recommends that you continue on your current medications as directed. Please refer to the Current Medication list given to you today.  *If you need a refill on your cardiac medications before your next appointment, please call your pharmacy*   Lab Work: NONE If you have labs (blood work) drawn today and your tests are completely normal, you will receive your results only by: Marland Kitchen MyChart Message (if you  have MyChart) OR . A paper copy in the mail If you have any lab test that is abnormal or we need to change your treatment, we will call you to review the results.   Testing/Procedures: Your physician has requested that you have a TEE. During a TEE, sound waves are used to create images of your heart. It provides your doctor with information about the size and shape of your heart and how well your heart's chambers and valves are working. In this test, a transducer is attached to the end of a flexible tube that's guided down your throat and into your esophagus (the tube leading from you mouth to your  stomach) to get a more detailed image of your heart. You are not awake for the procedure. Please see the instruction sheet given to you today.    Follow-Up: At [facility], you and your health needs are our priority.  As part of our continuing mission to provide you with exceptional heart care, we have created designated Provider Care Teams.  These Care Teams include your primary Cardiologist (physician) and Advanced Practice Providers (APPs -  Physician Assistants and Nurse Practitioners) who all work together to provide you with the care you need, when you need it.  We recommend signing up for the patient portal called "MyChart".  Sign up information is provided on this After Visit Summary.  MyChart is used to connect with patients for Virtual Visits (Telemedicine).  Patients are able to view lab/test results, encounter notes, upcoming appointments, etc.  Non-urgent messages can be sent to your provider as well.   To learn more about what you can do with MyChart, go to [email or web address].    Your next appointment:   4-5 month(s)  The format for your next appointment:   In Person  Provider:   You may see Izora Ribas, MD or one of the following Advanced Practice Providers on your designated Care Team:    Ronie Spies, PA-C  Jacolyn Reedy, PA-C    Dear, Jovita Gamma You are  scheduled for a TEE on July 03, 2020 at 10 AM with Dr. Izora Ribas.  Please arrive at the [facility] (Main Entrance A) at [facility]: [address] [place], [place] [address] at 8:30AM .   DIET: Nothing to eat or drink after midnight except a sip of water with medications    Labs: To be drawn prior to procedure at the hospital   You must have a responsible person to drive you home and stay in the waiting area during your procedure. Failure to do so could result in cancellation.  Bring your insurance cards.  *Special Note: Every effort is made to have your procedure done on time. Occasionally there are emergencies that occur at the hospital that may cause delays. Please be patient if a delay does occur.        Signed, Christell Constant, MD  06/19/2020 3:49 PM    [facility]

## 2020-06-19 NOTE — Patient Instructions (Addendum)
Medication Instructions:  Your physician recommends that you continue on your current medications as directed. Please refer to the Current Medication list given to you today.  *If you need a refill on your cardiac medications before your next appointment, please call your pharmacy*   Lab Work: NONE If you have labs (blood work) drawn today and your tests are completely normal, you will receive your results only by: Marland Kitchen MyChart Message (if you have MyChart) OR . A paper copy in the mail If you have any lab test that is abnormal or we need to change your treatment, we will call you to review the results.   Testing/Procedures: Your physician has requested that you have a TEE. During a TEE, sound waves are used to create images of your heart. It provides your doctor with information about the size and shape of your heart and how well your heart's chambers and valves are working. In this test, a transducer is attached to the end of a flexible tube that's guided down your throat and into your esophagus (the tube leading from you mouth to your stomach) to get a more detailed image of your heart. You are not awake for the procedure. Please see the instruction sheet given to you today.    Follow-Up: At [facility], you and your health needs are our priority.  As part of our continuing mission to provide you with exceptional heart care, we have created designated Provider Care Teams.  These Care Teams include your primary Cardiologist (physician) and Advanced Practice Providers (APPs -  Physician Assistants and Nurse Practitioners) who all work together to provide you with the care you need, when you need it.  We recommend signing up for the patient portal called "MyChart".  Sign up information is provided on this After Visit Summary.  MyChart is used to connect with patients for Virtual Visits (Telemedicine).  Patients are able to view lab/test results, encounter notes, upcoming appointments, etc.   Non-urgent messages can be sent to your provider as well.   To learn more about what you can do with MyChart, go to [email or web address].    Your next appointment:   4-5 month(s)  The format for your next appointment:   In Person  Provider:   You may see Izora Ribas, MD or one of the following Advanced Practice Providers on your designated Care Team:    Ronie Spies, PA-C  Jacolyn Reedy, PA-C    Dear, Tracey Holmes You are scheduled for a TEE on July 03, 2020 at 10 AM with Dr. Izora Ribas.  Please arrive at the [facility] (Main Entrance A) at [facility]: [address] [place], [place] [address] at 8:30AM .   DIET: Nothing to eat or drink after midnight except a sip of water with medications    Labs: To be drawn prior to procedure at the hospital   You must have a responsible person to drive you home and stay in the waiting area during your procedure. Failure to do so could result in cancellation.  Bring your insurance cards.  *Special Note: Every effort is made to have your procedure done on time. Occasionally there are emergencies that occur at the hospital that may cause delays. Please be patient if a delay does occur.

## 2020-06-19 NOTE — H&P (View-Only) (Signed)
Cardiology Office Note:    Date:  06/19/2020   ID:  Tracey CrockerShirley A Holmes, DOB January 19, 1934, MRN [id number]  PCP:  Laurann MontanaWhite, Cynthia, MD  [facility] Cardiologist:  No primary care provider on file.  [facility] Electrophysiologist:  None   CC:  Follow up stroke evaluation  History of Present Illness:    Tracey Holmes is a 87 y.o. female with a hx of HTN, HLD, TAAA (mild 4.0 cm) followed by Dr. Alla GermanVanTright, who presented after hospitalization 03/15/2020.  Since last visit: no evidence of AF on event monitor. Missed repeat echo appointment; scheduled for 05/22/20. In interim of this visit, patient had positive bubble study.    Patient notes that she is doing well.  Since last visit notes no changes.  Relevant interval testing or therapy include positive bubble study.  There are no interval hospital/ED visit.    No chest pain or pressure .  No SOB/DOE and no PND/Orthopnea.  No weight gain or leg swelling.  No palpitations or syncope .  No dysphagia or swallowing issues.  Past Medical History:  Diagnosis Date  . Aneurysm, descending thoracic aorta    Proximal (1.5 cm)  . Cystic kidney disease   . Dyslipidemia   . HTN (hypertension)   . Personal history of kidney stones   . Stroke [facility](HCC) 924/21    Past Surgical History:  Procedure Laterality Date  . TOTAL HIP ARTHROPLASTY Right 01/28/2020   Procedure: TOTAL HIP ARTHROPLASTY ANTERIOR APPROACH;  Surgeon: Samson FredericSwinteck, Brian, MD;  Location: [facility] OR;  Service: Orthopedics;  Laterality: Right;    Current Medications: Current Meds  Medication Sig  . amLODipine (NORVASC) 5 MG tablet Take 5 mg by mouth daily.   Marland Kitchen. aspirin EC 81 MG tablet Take 81 mg by mouth in the morning and at bedtime.  . brimonidine (ALPHAGAN) 0.2 % ophthalmic solution Place 1 drop into both eyes 3 (three) times daily.  . Cholecalciferol (VITAMIN D) 2000 UNITS tablet Take 4,000 Units by mouth daily.  Marland Kitchen. denosumab (PROLIA) 60 MG/ML SOLN injection Inject 60 mg into the skin every  6 (six) months. Administer in upper arm, thigh, or abdomen  . dorzolamide-timolol (COSOPT) 22.3-6.8 MG/ML ophthalmic solution dorzolamide 22.3 mg-timolol 6.8 mg/mL eye drops  . lactose free nutrition (BOOST) LIQD Take 237 mLs by mouth 3 (three) times daily between meals.  Marland Kitchen. loratadine (CLARITIN) 10 MG tablet 1 tablet  . Magnesium 400 MG TABS   . Multiple Vitamin (MULTIVITAMIN WITH MINERALS) TABS tablet Take 1 tablet by mouth daily.  . Netarsudil-Latanoprost (ROCKLATAN) 0.02-0.005 % SOLN 1 drop into affected eye  . niacin 250 MG tablet 1 tablet  . Nutritional Supplements (CARNATION BREAKFAST ESSENTIALS) PACK Take by mouth.  . polyethylene glycol (MIRALAX / GLYCOLAX) 17 g packet Take 17 g by mouth daily as needed for mild constipation.  . pravastatin (PRAVACHOL) 80 MG tablet Take 1 tablet by mouth every evening.     Allergies:   Benicar [olmesartan], Diovan [valsartan], and Penicillins   Social History   Socioeconomic History  . Marital status: Single    Spouse name: Not on file  . Number of children: Not on file  . Years of education: Not on file  . Highest education level: Not on file  Occupational History  . Not on file  Tobacco Use  . Smoking status: Never Smoker  . Smokeless tobacco: Never Used  Substance and Sexual Activity  . Alcohol use: No  . Drug use: No  . Sexual activity: Not  on file  Other Topics Concern  . Not on file  Social History Narrative   Lives alone   Right Handed   Drinks 1 cup caffeine daily   Social Determinants of Health   Financial Resource Strain: Not on file  Food Insecurity: Not on file  Transportation Needs: Not on file  Physical Activity: Not on file  Stress: Not on file  Social Connections: Not on file     Family History: The patient's family history is negative for AF.  ROS:   Please see the history of present illness.    All other systems reviewed and are negative.  EKGs/Labs/Other Studies Reviewed:    The following studies  were reviewed today:  EKG:   01/30/20- SR 90 low voltage precordial  leads  Cardiac Event Monitoring: Date: 03/21/20 Results:  Patient had an avg HR of 81 bpm.  Predominant underlying rhythm was sinus rhythm.  Isolated PACs were rare (<1.0%).  Isolated PVCs were rare (<1.0%).  No evidence of atrial fibrillation.  Triggered and diary events associated with sinus rhythm.   No malignant arrhythmias or evidence of atrial fibrillation.  Transthoracic Echocardiogram: Date:01/31/20 Results: 1. There is acceleration of flow through left ventricular outflow tract.  Non obstructive. Left ventricular ejection fraction, by estimation, is 65  to 70%. The left ventricle has normal function. The left ventricle has no  regional wall motion  abnormalities. Left ventricular diastolic parameters are consistent with  Grade I diastolic dysfunction (impaired relaxation).  2. Right ventricular systolic function is normal. The right ventricular  size is normal.  3. The mitral valve is normal in structure. No evidence of mitral valve  regurgitation. No evidence of mitral stenosis.  4. The aortic valve is normal in structure. Aortic valve regurgitation is  not visualized. No aortic stenosis is present.  5. Aortic dilatation noted. There is mild dilatation of the ascending  aorta, measuring 40 mm.  6. The inferior vena cava is normal in size with greater than 50%  respiratory variability, suggesting right atrial pressure of 3 mmHg.   Limited Repeat 06/11/20: 1. Left ventricular ejection fraction, by estimation, is 65 to 70%. The  left ventricle has normal function. The left ventricle has no regional  wall motion abnormalities.  2. The aortic valve is tricuspid. Aortic valve regurgitation is trivial.  3. Aortic dilatation noted. There is mild dilatation of the ascending  aorta, measuring 40 mm.  4. Evidence of atrial level shunting detected by color flow Doppler.  Agitated saline  contrast bubble study was positive with shunting observed  within 3-6 cardiac cycles suggestive of interatrial shunt. There is a  moderately sized patent foramen ovale  with predominantly right to left shunting across the atrial septum.  Conclusion(s)/Recommendation(s): Moderate-sized PFO wtih right to left  flow is noted.   Recent Labs: 01/27/2020: Magnesium 2.1 01/31/2020: ALT 24 02/02/2020: BUN 22; Creatinine, Ser 0.56; Hemoglobin 8.6; Platelets 264; Potassium 3.5; Sodium 139 06/12/2020: TSH 1.590  Recent Lipid Panel    Component Value Date/Time   CHOL 135 01/31/2020 0656   TRIG 84 01/31/2020 0656   HDL 45 01/31/2020 0656   CHOLHDL 3.0 01/31/2020 0656   VLDL 17 01/31/2020 0656   LDLCALC 73 01/31/2020 0656   OSH labs A1c 5.9 01/31/20 BNP 03/07/20- 357 Hgb 14.6, Platelet 307, Wbc 8.3 Creatinine 0.88  Physical Exam:    VS:  BP (!) 142/74   Pulse 80   Ht 5\' 3"  (1.6 m)   Wt 106 lb (48.1 kg)  SpO2 98%   BMI 18.78 kg/m     Wt Readings from Last 3 Encounters:  06/19/20 106 lb (48.1 kg)  06/12/20 114 lb 12.8 oz (52.1 kg)  05/16/20 113 lb (51.3 kg)     GEN: Elderly female, well developed in no acute distress HEENT: Mallampati II; no hearing aids today, no loose teeth NECK: No JVD; No carotid bruits LYMPHATICS: No lymphadenopathy CARDIAC: RRR, no murmurs, rubs, gallops RESPIRATORY:  Good air movement with inspiratory rhonchi ABDOMEN: Soft, non-tender, non-distended MUSCULOSKELETAL:  No edema; No deformity  SKIN: Warm and dry NEUROLOGIC:  Alert and oriented x 3 PSYCHIATRIC:  Normal affect   ASSESSMENT:    1. PFO (patent foramen ovale)   2. Embolic stroke involving cerebral artery (HCC)   3. Mixed hyperlipidemia   4. Essential hypertension   5. Thoracic aortic aneurysm without rupture (HCC)    PLAN:    In order of problems listed above:  Embolic Stroke - pending bubble study.    Essential Hypertension, - At goal, continue home medications - discussed diet  (DASH/low sodium), and exercise/weight loss interventions   Hyperlipidemia (mixed) -LDL goal less than 70, near goal -continue current statin - gave education on dietary changes  Asymptomatic thoracic aortic aneurysm - continue ASA presently - Last at 4.0 cm followed by CT Surgery - Will evaluation with TEE - Discussed the risks and benefits of imaging with no plans for future open heart surgery; will defer definitive therapy until after imaging testing Likely will see structural imaging team after procedure.  Five months (and TEE)  follow up unless new symptoms or abnormal test results warranting change in plan (or turned down by structural team)  Would be reasonable for  APP Follow up   Shared Decision Making/Informed Consent The risks [esophageal damage, perforation (1:10,000 risk), bleeding, pharyngeal hematoma as well as other potential complications associated with conscious sedation including aspiration, arrhythmia, respiratory failure and death], benefits (treatment guidance and diagnostic support) and alternatives of a transesophageal echocardiogram were discussed in detail with [name] and she is willing to proceed.    Medication Adjustments/Labs and Tests Ordered: Current medicines are reviewed at length with the patient today.  Concerns regarding medicines are outlined above.  No orders of the defined types were placed in this encounter.  No orders of the defined types were placed in this encounter.   Patient Instructions  Medication Instructions:  Your physician recommends that you continue on your current medications as directed. Please refer to the Current Medication list given to you today.  *If you need a refill on your cardiac medications before your next appointment, please call your pharmacy*   Lab Work: NONE If you have labs (blood work) drawn today and your tests are completely normal, you will receive your results only by: Marland Kitchen MyChart Message (if you  have MyChart) OR . A paper copy in the mail If you have any lab test that is abnormal or we need to change your treatment, we will call you to review the results.   Testing/Procedures: Your physician has requested that you have a TEE. During a TEE, sound waves are used to create images of your heart. It provides your doctor with information about the size and shape of your heart and how well your heart's chambers and valves are working. In this test, a transducer is attached to the end of a flexible tube that's guided down your throat and into your esophagus (the tube leading from you mouth to your  stomach) to get a more detailed image of your heart. You are not awake for the procedure. Please see the instruction sheet given to you today.    Follow-Up: At [facility], you and your health needs are our priority.  As part of our continuing mission to provide you with exceptional heart care, we have created designated Provider Care Teams.  These Care Teams include your primary Cardiologist (physician) and Advanced Practice Providers (APPs -  Physician Assistants and Nurse Practitioners) who all work together to provide you with the care you need, when you need it.  We recommend signing up for the patient portal called "MyChart".  Sign up information is provided on this After Visit Summary.  MyChart is used to connect with patients for Virtual Visits (Telemedicine).  Patients are able to view lab/test results, encounter notes, upcoming appointments, etc.  Non-urgent messages can be sent to your provider as well.   To learn more about what you can do with MyChart, go to [email or web address].    Your next appointment:   4-5 month(s)  The format for your next appointment:   In Person  Provider:   You may see Izora Ribas, MD or one of the following Advanced Practice Providers on your designated Care Team:    Ronie Spies, PA-C  Jacolyn Reedy, PA-C    Dear, Jovita Gamma You are  scheduled for a TEE on July 03, 2020 at 10 AM with Dr. Izora Ribas.  Please arrive at the [facility] (Main Entrance A) at [facility]: [address] [place], [place] [address] at 8:30AM .   DIET: Nothing to eat or drink after midnight except a sip of water with medications    Labs: To be drawn prior to procedure at the hospital   You must have a responsible person to drive you home and stay in the waiting area during your procedure. Failure to do so could result in cancellation.  Bring your insurance cards.  *Special Note: Every effort is made to have your procedure done on time. Occasionally there are emergencies that occur at the hospital that may cause delays. Please be patient if a delay does occur.        Signed, Christell Constant, MD  06/19/2020 3:49 PM    [facility]

## 2020-06-26 ENCOUNTER — Telehealth: Payer: Self-pay | Admitting: *Deleted

## 2020-06-26 NOTE — Telephone Encounter (Signed)
I spoke to the patient and provided her with the lab results. 

## 2020-06-26 NOTE — Telephone Encounter (Signed)
-----   Message from Micki Riley, MD sent at 06/18/2020  4:15 PM EST ----- Kindly inform the patient that lab work for reversible causes of memory loss was all normal

## 2020-06-28 DIAGNOSIS — M810 Age-related osteoporosis without current pathological fracture: Secondary | ICD-10-CM | POA: Diagnosis not present

## 2020-06-28 DIAGNOSIS — I10 Essential (primary) hypertension: Secondary | ICD-10-CM | POA: Diagnosis not present

## 2020-06-28 DIAGNOSIS — Q211 Atrial septal defect: Secondary | ICD-10-CM | POA: Diagnosis not present

## 2020-06-28 DIAGNOSIS — Z23 Encounter for immunization: Secondary | ICD-10-CM | POA: Diagnosis not present

## 2020-06-28 DIAGNOSIS — E785 Hyperlipidemia, unspecified: Secondary | ICD-10-CM | POA: Diagnosis not present

## 2020-07-03 ENCOUNTER — Ambulatory Visit (HOSPITAL_COMMUNITY): Payer: Medicare Other | Admitting: Anesthesiology

## 2020-07-03 ENCOUNTER — Ambulatory Visit (HOSPITAL_COMMUNITY)
Admission: RE | Admit: 2020-07-03 | Discharge: 2020-07-03 | Disposition: A | Discharge status: Home / Self Care | Payer: Medicare Other | Attending: Internal Medicine | Admitting: Internal Medicine

## 2020-07-03 ENCOUNTER — Encounter (HOSPITAL_COMMUNITY): Payer: Self-pay | Admitting: Internal Medicine

## 2020-07-03 ENCOUNTER — Other Ambulatory Visit: Payer: Medicare Other

## 2020-07-03 ENCOUNTER — Encounter (HOSPITAL_COMMUNITY)
Admission: RE | Disposition: A | Discharge status: Home / Self Care | Payer: Self-pay | Source: Home / Self Care | Attending: Internal Medicine

## 2020-07-03 ENCOUNTER — Other Ambulatory Visit: Payer: Self-pay

## 2020-07-03 ENCOUNTER — Ambulatory Visit (HOSPITAL_BASED_OUTPATIENT_CLINIC_OR_DEPARTMENT_OTHER)
Admission: RE | Admit: 2020-07-03 | Discharge: 2020-07-03 | Disposition: A | Discharge status: Home / Self Care | Payer: Medicare Other | Source: Ambulatory Visit | Attending: Internal Medicine | Admitting: Internal Medicine

## 2020-07-03 DIAGNOSIS — I712 Thoracic aortic aneurysm, without rupture: Secondary | ICD-10-CM | POA: Insufficient documentation

## 2020-07-03 DIAGNOSIS — I6340 Cerebral infarction due to embolism of unspecified cerebral artery: Secondary | ICD-10-CM | POA: Insufficient documentation

## 2020-07-03 DIAGNOSIS — Z888 Allergy status to other drugs, medicaments and biological substances status: Secondary | ICD-10-CM | POA: Insufficient documentation

## 2020-07-03 DIAGNOSIS — Z79899 Other long term (current) drug therapy: Secondary | ICD-10-CM | POA: Diagnosis not present

## 2020-07-03 DIAGNOSIS — Z7982 Long term (current) use of aspirin: Secondary | ICD-10-CM | POA: Insufficient documentation

## 2020-07-03 DIAGNOSIS — Q211 Atrial septal defect: Secondary | ICD-10-CM | POA: Diagnosis not present

## 2020-07-03 DIAGNOSIS — I082 Rheumatic disorders of both aortic and tricuspid valves: Secondary | ICD-10-CM | POA: Diagnosis not present

## 2020-07-03 DIAGNOSIS — Z880 Allergy status to penicillin: Secondary | ICD-10-CM | POA: Diagnosis not present

## 2020-07-03 DIAGNOSIS — I10 Essential (primary) hypertension: Secondary | ICD-10-CM | POA: Diagnosis not present

## 2020-07-03 DIAGNOSIS — E782 Mixed hyperlipidemia: Secondary | ICD-10-CM | POA: Insufficient documentation

## 2020-07-03 DIAGNOSIS — Z20822 Contact with and (suspected) exposure to covid-19: Secondary | ICD-10-CM | POA: Diagnosis not present

## 2020-07-03 DIAGNOSIS — I340 Nonrheumatic mitral (valve) insufficiency: Secondary | ICD-10-CM | POA: Diagnosis not present

## 2020-07-03 DIAGNOSIS — Q2112 Patent foramen ovale: Secondary | ICD-10-CM

## 2020-07-03 DIAGNOSIS — S72001A Fracture of unspecified part of neck of right femur, initial encounter for closed fracture: Secondary | ICD-10-CM | POA: Diagnosis not present

## 2020-07-03 HISTORY — PX: BUBBLE STUDY: SHX6837

## 2020-07-03 HISTORY — PX: TEE WITHOUT CARDIOVERSION: SHX5443

## 2020-07-03 LAB — RESP PANEL BY RT-PCR (FLU A&B, COVID) ARPGX2
Influenza A by PCR: NEGATIVE
Influenza B by PCR: NEGATIVE
SARS Coronavirus 2 by RT PCR: NEGATIVE

## 2020-07-03 SURGERY — ECHOCARDIOGRAM, TRANSESOPHAGEAL
Anesthesia: Monitor Anesthesia Care

## 2020-07-03 MED ORDER — BUTAMBEN-TETRACAINE-BENZOCAINE 2-2-14 % EX AERO
INHALATION_SPRAY | CUTANEOUS | Status: DC | PRN
  Administered 2020-07-03: 2 via TOPICAL

## 2020-07-03 MED ORDER — LIDOCAINE HCL (CARDIAC) PF 100 MG/5ML IV SOSY
PREFILLED_SYRINGE | INTRAVENOUS | Status: DC | PRN
  Administered 2020-07-03: 20 mg via INTRATRACHEAL

## 2020-07-03 MED ORDER — SODIUM CHLORIDE 0.9 % IV SOLN: INTRAVENOUS | Status: DC

## 2020-07-03 MED ORDER — PROPOFOL 10 MG/ML IV BOLUS
INTRAVENOUS | Status: DC | PRN
  Administered 2020-07-03: 20 mg via INTRAVENOUS

## 2020-07-03 MED ORDER — PROPOFOL 500 MG/50ML IV EMUL
INTRAVENOUS | Status: DC | PRN
  Administered 2020-07-03: 75 ug/kg/min via INTRAVENOUS

## 2020-07-03 NOTE — CV Procedure (Addendum)
    TRANSESOPHAGEAL ECHOCARDIOGRAM   NAME:  Tracey Holmes    MRN: [id number] DOB:  [birth year]    ADMIT DATE: 07/03/2020  INDICATIONS: Embolic Stroke  PROCEDURE:   Informed consent was obtained prior to the procedure. The risks, benefits and alternatives for the procedure were discussed and the patient comprehended these risks.  Risks include, but are not limited to, cough, sore throat, vomiting, nausea, somnolence, esophageal and stomach trauma or perforation, bleeding, low blood pressure, aspiration, pneumonia, infection, trauma to the teeth and death.    Procedural time out performed. The oropharynx was anesthetized with topical 1% benzocaine.    Anesthesia was administered by Dr. Malen Gauze and his team.  The patient was administered a total of propofol (90 mg) and lidocaine (20 mg) to achieve and maintain moderate conscious sedation.  The patient's heart rate, blood pressure, and oxygen saturation are monitored continuously during the procedure. The period of conscious sedation is 20 minutes, of which I was present face-to-face 100% of this time.   The transesophageal probe was inserted in the esophagus and stomach without difficulty and multiple views were obtained.   COMPLICATIONS:    There were no immediate complications.  KEY FINDINGS:  1. Patent Foramen Ovale Noted.  2. Full report to follow. 3. Further management per primary team.   Riley Lam, MD [place]  [facility]  11:56 AM  Addendum:  Along with patient, left a message summarizing key findings to her friend; Consuella Lose.

## 2020-07-03 NOTE — Anesthesia Procedure Notes (Signed)
Procedure Name: MAC Date/Time: 07/03/2020 11:31 AM Performed by: Lavell Luster, CRNA Pre-anesthesia Checklist: Patient identified, Emergency Drugs available, Suction available, Patient being monitored and Timeout performed Patient Re-evaluated:Patient Re-evaluated prior to induction Oxygen Delivery Method: Nasal cannula Preoxygenation: Pre-oxygenation with 100% oxygen Induction Type: IV induction Placement Confirmation: breath sounds checked- equal and bilateral and positive ETCO2 Dental Injury: Teeth and Oropharynx as per pre-operative assessment

## 2020-07-03 NOTE — Transfer of Care (Signed)
Immediate Anesthesia Transfer of Care Note  Patient: Tracey Holmes  Procedure(s) Performed: TRANSESOPHAGEAL ECHOCARDIOGRAM (TEE) (N/A ) BUBBLE STUDY  Patient Location: Endoscopy Unit  Anesthesia Type:MAC  Level of Consciousness: sedated  Airway & Oxygen Therapy: Patient connected to nasal cannula oxygen  Post-op Assessment: Post -op Vital signs reviewed and stable  Post vital signs: stable  Last Vitals:  Vitals Value Taken Time  BP    Temp    Pulse    Resp    SpO2      Last Pain:  Vitals:   07/03/20 1019  TempSrc: Oral  PainSc: 0-No pain         Complications: No complications documented.

## 2020-07-03 NOTE — Progress Notes (Signed)
  Echocardiogram 2D Echocardiogram has been performed.  Tracey Holmes 07/03/2020, 12:36 PM

## 2020-07-03 NOTE — Anesthesia Postprocedure Evaluation (Signed)
Anesthesia Post Note  Patient: Tracey Holmes  Procedure(s) Performed: TRANSESOPHAGEAL ECHOCARDIOGRAM (TEE) (N/A ) BUBBLE STUDY     Patient location during evaluation: PACU Anesthesia Type: MAC Level of consciousness: awake and alert and oriented Pain management: pain level controlled Vital Signs Assessment: post-procedure vital signs reviewed and stable Respiratory status: spontaneous breathing, nonlabored ventilation and respiratory function stable Cardiovascular status: stable and blood pressure returned to baseline Postop Assessment: no apparent nausea or vomiting Anesthetic complications: no   No complications documented.  Last Vitals:  Vitals:   07/03/20 1155 07/03/20 1200  BP: (!) 117/54 (!) 144/69  Pulse: 74 77  Resp: 15 13  Temp: 36.7 C   SpO2: 99% 100%    Last Pain:  Vitals:   07/03/20 1155  TempSrc: Oral  PainSc: 0-No pain                 FOSTER,MICHAEL A.

## 2020-07-03 NOTE — Discharge Instructions (Signed)

## 2020-07-03 NOTE — Anesthesia Preprocedure Evaluation (Addendum)
Anesthesia Evaluation  Patient identified by MRN, date of birth, ID band Patient awake    Reviewed: Allergy & Precautions, NPO status , Patient's Chart, lab work & pertinent test results  Airway Mallampati: II  TM Distance: >3 FB     Dental  (+) Poor Dentition, Missing, Dental Advisory Given, Caps,    Pulmonary neg pulmonary ROS,    Pulmonary exam normal breath sounds clear to auscultation       Cardiovascular hypertension, Pt. on medications Normal cardiovascular exam Rhythm:Regular Rate:Normal  PFO   Echo 06/11/20 1. Left ventricular ejection fraction, by estimation, is 65 to 70%. The left ventricle has normal function. The left ventricle has no regional wall motion abnormalities.  2. The aortic valve is tricuspid. Aortic valve regurgitation is trivial.  3. Aortic dilatation noted. There is mild dilatation of the ascending aorta, measuring 40 mm.  4. Evidence of atrial level shunting detected by color flow Doppler. Agitated saline contrast bubble study was positive with shunting observed within 3-6 cardiac cycles suggestive of interatrial shunt. There is a moderately sized patent foramen ovale with predominantly right to left shunting across the atrial septum.      Neuro/Psych Hx/o embolic strokes 01/1790 Aphasia @ time of stroke recovered Residual memory loss and mild cognitive impairment CVA, Residual Symptoms negative psych ROS   GI/Hepatic negative GI ROS, Neg liver ROS,   Endo/Other  negative endocrine ROS  Renal/GU Renal diseaseHx/o renal calculi  negative genitourinary   Musculoskeletal negative musculoskeletal ROS (+)   Abdominal   Peds  Hematology negative hematology ROS (+)   Anesthesia Other Findings   Reproductive/Obstetrics                           Anesthesia Physical Anesthesia Plan  ASA: III  Anesthesia Plan: MAC   Post-op Pain Management:    Induction:  Intravenous  PONV Risk Score and Plan: Propofol infusion and Treatment may vary due to age or medical condition  Airway Management Planned: Natural Airway and Nasal Cannula  Additional Equipment:   Intra-op Plan:   Post-operative Plan:   Informed Consent: I have reviewed the patients History and Physical, chart, labs and discussed the procedure including the risks, benefits and alternatives for the proposed anesthesia with the patient or authorized representative who has indicated his/her understanding and acceptance.     Dental advisory given  Plan Discussed with: CRNA and Anesthesiologist  Anesthesia Plan Comments:        Anesthesia Quick Evaluation

## 2020-07-03 NOTE — Interval H&P Note (Signed)
History and Physical Interval Note:  07/03/2020 10:16 AM  Tracey Holmes  has presented today for surgery, with the diagnosis of PFO.  The various methods of treatment have been discussed with the patient and family. After consideration of risks, benefits and other options for treatment, the patient has consented to  Procedure(s): TRANSESOPHAGEAL ECHOCARDIOGRAM (TEE) (N/A) as a surgical intervention.  The patient's history has been reviewed, patient examined, no change in status, stable for surgery.  I have reviewed the patient's chart and labs.  Questions were answered to the patient's satisfaction.     Paxon Propes A Farrah Skoda

## 2020-07-04 ENCOUNTER — Encounter (HOSPITAL_COMMUNITY): Payer: Self-pay | Admitting: Internal Medicine

## 2020-07-24 ENCOUNTER — Ambulatory Visit: Payer: Medicare Other | Admitting: Neurology

## 2020-07-24 DIAGNOSIS — R413 Other amnesia: Secondary | ICD-10-CM

## 2020-07-24 DIAGNOSIS — R410 Disorientation, unspecified: Secondary | ICD-10-CM

## 2020-07-26 DIAGNOSIS — S72031D Displaced midcervical fracture of right femur, subsequent encounter for closed fracture with routine healing: Secondary | ICD-10-CM | POA: Diagnosis not present

## 2020-07-29 ENCOUNTER — Telehealth: Payer: Self-pay

## 2020-07-29 ENCOUNTER — Telehealth: Payer: Self-pay | Admitting: Internal Medicine

## 2020-07-29 NOTE — Telephone Encounter (Signed)
Missed Calls to patient regarding PFO.  Busy signal; so unable to leave message.   In summary:  Reviewed case with Drs Excell Seltzer and Pearlean Brownie, who reviewed her case and recommended that she would not need closure of her PFO.  Have been attempted to call her.  If she doesn't call back; we may need to call her friend to let her know the news.  Riley Lam, MD Cardiologist [facility]  [address] [place], [address] [place], [place] [address] [phone]  2:52 PM

## 2020-07-29 NOTE — Telephone Encounter (Signed)
Missed Calls to patient regarding PFO.  Busy signal; so unable to leave message.   In summary:  Reviewed case with Drs Excell Seltzer and Pearlean Brownie, who reviewed her case and recommended that she would not need closure of her PFO.  Have been attempted to call her.  If she doesn't call back; we may need to call her friend to let her know the news.  Tracey Lam, MD Cardiologist [facility]  [address] [place], [address] [place], [place] [address] [phone]  2:52 PM  Called patient notified her of MD recommendations and answered all questions.

## 2020-08-01 ENCOUNTER — Ambulatory Visit: Payer: Medicare Other | Admitting: Cardiothoracic Surgery

## 2020-08-01 ENCOUNTER — Ambulatory Visit
Admission: RE | Admit: 2020-08-01 | Discharge: 2020-08-01 | Disposition: A | Discharge status: Home / Self Care | Payer: Medicare Other | Source: Ambulatory Visit | Attending: Cardiothoracic Surgery | Admitting: Cardiothoracic Surgery

## 2020-08-01 ENCOUNTER — Other Ambulatory Visit: Payer: Self-pay

## 2020-08-01 VITALS — BP 150/83 | HR 71 | Resp 20 | Ht 63.0 in | Wt 110.0 lb

## 2020-08-01 DIAGNOSIS — I2510 Atherosclerotic heart disease of native coronary artery without angina pectoris: Secondary | ICD-10-CM | POA: Diagnosis not present

## 2020-08-01 DIAGNOSIS — J9811 Atelectasis: Secondary | ICD-10-CM | POA: Diagnosis not present

## 2020-08-01 DIAGNOSIS — I712 Thoracic aortic aneurysm, without rupture, unspecified: Secondary | ICD-10-CM

## 2020-08-01 DIAGNOSIS — I700 Atherosclerosis of aorta: Secondary | ICD-10-CM | POA: Diagnosis not present

## 2020-08-01 NOTE — Progress Notes (Signed)
Chief complaint: Asymptomatic ascending aortic aneurysm   History of present illness: 87 year old lady presents for surveillance of ascending arctic aneurysm.  She has been followed for nearly a decade by Dr. Donata Clay for a proximal descending penetrating ulcer and the interval development of a small ascending aortic aneurysm.  She states that her blood pressure is generally well controlled at home.  She recently underwent right hip replacement and suffered from a stroke.  Her follow-up evaluation demonstrated a PFO.  She denies any symptoms of chest pain back pain or heart failure.  Past Medical History:  Diagnosis Date  . Aneurysm, descending thoracic aorta    Proximal (1.5 cm)  . Cystic kidney disease   . Dyslipidemia   . HTN (hypertension)   . Personal history of kidney stones   . Stroke [facility]) 924/21    Current Outpatient Medications  Medication Instructions  . amLODipine (NORVASC) 5 mg, Oral, Daily  . aspirin EC 81 mg, Oral, Daily  . brimonidine (ALPHAGAN) 0.2 % ophthalmic solution 1 drop, Both Eyes, 3 times daily  . denosumab (PROLIA) 60 mg, Subcutaneous, Every 6 months, Administer in upper arm, thigh, or abdomen   . dorzolamide-timolol (COSOPT) 22.3-6.8 MG/ML ophthalmic solution 1 drop, Both Eyes, 2 times daily  . lactose free nutrition (BOOST) LIQD 237 mLs, Oral, Every other day  . loratadine (CLARITIN) 10 mg, Oral, Daily at bedtime  . Multiple Minerals (CALCIUM-MAGNESIUM-ZINC) TABS 1 tablet, Oral, Daily  . Multiple Vitamin (MULTIVITAMIN WITH MINERALS) TABS tablet 1 tablet, Oral, Daily  . Netarsudil-Latanoprost (ROCKLATAN) 0.02-0.005 % SOLN 1 drop, Both Eyes, Daily  . niacin 250 mg, Oral, Daily  . Nutritional Supplements (CARNATION BREAKFAST ESSENTIALS) PACK 1 packet, Oral, Every other day  . polyethylene glycol (MIRALAX / GLYCOLAX) 17 g, Oral, Daily PRN  . pravastatin (PRAVACHOL) 80 mg, Oral, Every evening  . Vitamin D 4,000 Units, Oral, Daily    Physical exam:  BP (!)  150/83   Pulse 71   Resp 20   Ht 5\' 3"  (1.6 m)   Wt 49.9 kg   SpO2 98% Comment: RA  BMI 19.49 kg/m  Elderly lady in no acute distress HEENT: Normocephalic atraumatic, no JVD no carotid bruits Chest clear to auscultation bilaterally Cardiac regular rate and rhythm Extremities frail but no edema  Imaging:  I have personally reviewed her available CT scan from today which demonstrates a stable ascending aortic aneurysm measuring less than 4.5 cm in maximal diameter.  She has a new right lower lobe nodule.  Impression: 87 year old lady who is quite frail with a stable ascending aortic aneurysm.  There is no longer any evidence for penetrating ulcer the proximal descending.  In addition, the descending aorta is of normal caliber.  Given her frailty she would not be likely to ever undergo elective or urgent repair of an ascending aortic aneurysm.  However we will refer to pulmonology for a new pulmonary nodule  Plan: Refer to Dr. 83 for assessment of new right lower lobe nodule Follow-up with thoracic surgery as needed for aneurysm; there is little need for continued surveillance given the fact that she would never be offered elective surgery Follow-up with cardiology for PFO  Kaiyu Mirabal Z. Tonia Brooms, MD [phone]

## 2020-08-01 NOTE — Progress Notes (Signed)
Kindly inform the patient that EEG study showed mild slowing of brainwave activity which is a nonspecific finding and may be a result of her previous strokes.  No definite seizure activity or any worrisome finding.

## 2020-08-04 NOTE — Telephone Encounter (Signed)
Called patient:  Phone was picked up, no words were spoken or responded to on receiver end and the hung up.  Have attempted to reach her multiple times in follow up.  Can we send a letter to [name] denoting the following?   We have attempt to reach to you to follow up after your procedure (TEE).  After reviewing the case with our PFO Closure team, Dr. Excell Seltzer and Dr. Pearlean Brownie, we will not have to do a future procedure to clip the small hole in your heart.  This is good news.  Please reach out to [place] if you or your friends and family have questions.  Best,  Tracey Lam, MD Cardiologist [facility]  [address] [place], [place] [address] [phone]  3:57 PM

## 2020-08-05 NOTE — Telephone Encounter (Signed)
Letter created and mailed.

## 2020-08-07 ENCOUNTER — Telehealth: Payer: Self-pay | Admitting: Neurology

## 2020-08-07 NOTE — Telephone Encounter (Signed)
-----   Message from Pramod S Sethi, MD sent at 08/01/2020  4:36 PM EDT ----- Kindly inform the patient that EEG study showed mild slowing of brainwave activity which is a nonspecific finding and may be a result of her previous strokes.  No definite seizure activity or any worrisome finding. 

## 2020-08-07 NOTE — Telephone Encounter (Signed)
Attempted to contact the pt. There was no answer. There was no VM set up.   **If patient calls back please advise that Dr Pearlean Brownie reviewed the EEG results and there was no seizure activity noted or any worrisome finding.

## 2020-08-21 ENCOUNTER — Telehealth: Payer: Self-pay | Admitting: Emergency Medicine

## 2020-08-21 NOTE — Telephone Encounter (Signed)
-----   Message from Micki Riley, MD sent at 08/01/2020  4:36 PM EDT ----- Joneen Roach inform the patient that EEG study showed mild slowing of brainwave activity which is a nonspecific finding and may be a result of her previous strokes.  No definite seizure activity or any worrisome finding.

## 2020-08-21 NOTE — Telephone Encounter (Signed)
Reached patient by phone and went over Dr. Marlis Edelson findings of EEG study.  Patient denied further questions, verbalized understanding and expressed appreciation for the phone call.

## 2020-08-29 DIAGNOSIS — X32XXXD Exposure to sunlight, subsequent encounter: Secondary | ICD-10-CM | POA: Diagnosis not present

## 2020-08-29 DIAGNOSIS — L570 Actinic keratosis: Secondary | ICD-10-CM | POA: Diagnosis not present

## 2020-09-04 ENCOUNTER — Ambulatory Visit: Payer: Medicare Other | Admitting: Pulmonary Disease

## 2020-09-04 ENCOUNTER — Encounter: Payer: Self-pay | Admitting: Pulmonary Disease

## 2020-09-04 ENCOUNTER — Telehealth: Payer: Self-pay | Admitting: Pulmonary Disease

## 2020-09-04 ENCOUNTER — Other Ambulatory Visit: Payer: Self-pay

## 2020-09-04 VITALS — BP 120/70 | HR 72 | Temp 98.3°F | Ht 63.0 in | Wt 108.4 lb

## 2020-09-04 DIAGNOSIS — Z789 Other specified health status: Secondary | ICD-10-CM

## 2020-09-04 DIAGNOSIS — Q211 Atrial septal defect: Secondary | ICD-10-CM

## 2020-09-04 DIAGNOSIS — I6340 Cerebral infarction due to embolism of unspecified cerebral artery: Secondary | ICD-10-CM | POA: Diagnosis not present

## 2020-09-04 DIAGNOSIS — R911 Solitary pulmonary nodule: Secondary | ICD-10-CM

## 2020-09-04 DIAGNOSIS — Q2112 Patent foramen ovale: Secondary | ICD-10-CM

## 2020-09-04 NOTE — H&P (View-Only) (Signed)
Synopsis: Referred in November 2022 for right lower lobe lung nodule by Linden Dolin, MD  Subjective:   PATIENT ID: Tracey Holmes GENDER: female DOB: 1933-08-28, MRN: [id number]  Chief Complaint  Patient presents with  . Follow-up    Pt is being referred by Dr. Vickey Sages due to lung nodule. Pt denies any complaints of cough or SOB.    This is a 87 year old female with a descending thoracic aneurysm, dyslipidemia, hypertension, history of stroke.  Patient was being evaluated by cardiothoracic surgery for aneurysm and on her follow-up CT imaging was found to have a right lower lobe pulmonary nodule.  This nodule is new in comparison to CT imaging that was completed in September 2020.  Her follow-up images in August 01, 2020 revealed a new 1.5 centimeter right lower lobe peripheral pulmonary nodule.  Patient was referred to [place] for evaluation and considerations for next steps.  Patient is a lifelong non-smoker.  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  She lives alone and able to take care of herself and manage her household.  She does have a neighbor with her today which helps bring her to her appointments as well as mowing her yard.   Past Medical History:  Diagnosis Date  . Aneurysm, descending thoracic aorta    Proximal (1.5 cm)  . Cystic kidney disease   . Dyslipidemia   . HTN (hypertension)   . Personal history of kidney stones   . Stroke [facility]) 924/21     No family history on file.   Past Surgical History:  Procedure Laterality Date  . BUBBLE STUDY  07/03/2020   Procedure: BUBBLE STUDY;  Surgeon: Christell Constant, MD;  Location: [facility] ENDOSCOPY;  Service: Cardiovascular;;  . TEE WITHOUT CARDIOVERSION N/A 07/03/2020   Procedure: TRANSESOPHAGEAL ECHOCARDIOGRAM (TEE);  Surgeon: Christell Constant, MD;  Location: [facility] ENDOSCOPY;  Service: Cardiovascular;  Laterality: N/A;  . TOTAL HIP ARTHROPLASTY Right  01/28/2020   Procedure: TOTAL HIP ARTHROPLASTY ANTERIOR APPROACH;  Surgeon: Samson Frederic, MD;  Location: [facility] OR;  Service: Orthopedics;  Laterality: Right;    Social History   Socioeconomic History  . Marital status: Single    Spouse name: Not on file  . Number of children: Not on file  . Years of education: Not on file  . Highest education level: Not on file  Occupational History  . Not on file  Tobacco Use  . Smoking status: Never Smoker  . Smokeless tobacco: Never Used  Substance and Sexual Activity  . Alcohol use: No  . Drug use: No  . Sexual activity: Not on file  Other Topics Concern  . Not on file  Social History Narrative   Lives alone   Right Handed   Drinks 1 cup caffeine daily   Social Determinants of Health   Financial Resource Strain: Not on file  Food Insecurity: Not on file  Transportation Needs: Not on file  Physical Activity: Not on file  Stress: Not on file  Social Connections: Not on file  Intimate Partner Violence: Not on file     Allergies  Allergen Reactions  . Benicar [Olmesartan] Rash  . Diovan [Valsartan] Rash  . Penicillins Rash     Outpatient Medications Prior to Visit  Medication Sig Dispense Refill  . amLODipine (NORVASC) 5 MG tablet Take 5 mg by mouth daily.     Marland Kitchen aspirin EC 81 MG tablet Take 81  mg by mouth daily.    . brimonidine (ALPHAGAN) 0.2 % ophthalmic solution Place 1 drop into both eyes 3 (three) times daily.    . Cholecalciferol (VITAMIN D) 2000 UNITS tablet Take 4,000 Units by mouth daily.    Marland Kitchen denosumab (PROLIA) 60 MG/ML SOLN injection Inject 60 mg into the skin every 6 (six) months. Administer in upper arm, thigh, or abdomen    . dorzolamide-timolol (COSOPT) 22.3-6.8 MG/ML ophthalmic solution Place 1 drop into both eyes 2 (two) times daily.    Marland Kitchen lactose free nutrition (BOOST) LIQD Take 237 mLs by mouth every other day.    . loratadine (CLARITIN) 10 MG tablet Take 10 mg by mouth at bedtime.    . Multiple Vitamin  (MULTIVITAMIN WITH MINERALS) TABS tablet Take 1 tablet by mouth daily.    . Netarsudil-Latanoprost (ROCKLATAN) 0.02-0.005 % SOLN Place 1 drop into both eyes daily.    . niacin 250 MG tablet Take 250 mg by mouth daily.    . Nutritional Supplements (CARNATION BREAKFAST ESSENTIALS) PACK Take 1 packet by mouth every other day.    . polyethylene glycol (MIRALAX / GLYCOLAX) 17 g packet Take 17 g by mouth daily as needed for mild constipation. 14 each 0  . pravastatin (PRAVACHOL) 80 MG tablet Take 80 mg by mouth every evening.    . Multiple Minerals (CALCIUM-MAGNESIUM-ZINC) TABS Take 1 tablet by mouth daily.     No facility-administered medications prior to visit.    Review of Systems  Constitutional: Negative for chills, fever, malaise/fatigue and weight loss.  HENT: Negative for hearing loss, sore throat and tinnitus.   Eyes: Negative for blurred vision and double vision.  Respiratory: Negative for cough, hemoptysis, sputum production, shortness of breath, wheezing and stridor.   Cardiovascular: Negative for chest pain, palpitations, orthopnea, leg swelling and PND.  Gastrointestinal: Negative for abdominal pain, constipation, diarrhea, heartburn, nausea and vomiting.  Genitourinary: Negative for dysuria, hematuria and urgency.  Musculoskeletal: Negative for joint pain and myalgias.  Skin: Negative for itching and rash.  Neurological: Negative for dizziness, tingling, weakness and headaches.  Endo/Heme/Allergies: Negative for environmental allergies. Does not bruise/bleed easily.  Psychiatric/Behavioral: Negative for depression. The patient is not nervous/anxious and does not have insomnia.   All other systems reviewed and are negative.    Objective:  Physical Exam Vitals reviewed.  Constitutional:      General: She is not in acute distress.    Appearance: She is well-developed.  HENT:     Head: Normocephalic and atraumatic.  Eyes:     General: No scleral icterus.     Conjunctiva/sclera: Conjunctivae normal.     Pupils: Pupils are equal, round, and reactive to light.  Neck:     Vascular: No JVD.     Trachea: No tracheal deviation.  Cardiovascular:     Rate and Rhythm: Normal rate and regular rhythm.     Heart sounds: Normal heart sounds. No murmur heard.   Pulmonary:     Effort: Pulmonary effort is normal. No tachypnea, accessory muscle usage or respiratory distress.     Breath sounds: Normal breath sounds. No stridor. No wheezing, rhonchi or rales.  Abdominal:     General: Bowel sounds are normal. There is no distension.     Palpations: Abdomen is soft.     Tenderness: There is no abdominal tenderness.  Musculoskeletal:        General: No tenderness.     Cervical back: Neck supple.     Comments: Kyphosis  Lymphadenopathy:     Cervical: No cervical adenopathy.  Skin:    General: Skin is warm and dry.     Capillary Refill: Capillary refill takes less than 2 seconds.     Findings: No rash.  Neurological:     Mental Status: She is alert and oriented to person, place, and time.  Psychiatric:        Behavior: Behavior normal.      Vitals:   09/04/20 1137  BP: 120/70  Pulse: 72  Temp: 98.3 F (36.8 C)  TempSrc: Temporal  SpO2: 98%  Weight: 108 lb 6.4 oz (49.2 kg)  Height: 5\' 3"  (1.6 m)   98% on RA BMI Readings from Last 3 Encounters:  09/04/20 19.20 kg/m  08/01/20 19.49 kg/m  07/03/20 18.78 kg/m   Wt Readings from Last 3 Encounters:  09/04/20 108 lb 6.4 oz (49.2 kg)  08/01/20 110 lb (49.9 kg)  07/03/20 106 lb (48.1 kg)    CBC    Component Value Date/Time   WBC 11.8 (H) 02/02/2020 0225   RBC 2.67 (L) 02/02/2020 0225   HGB 8.6 (L) 02/02/2020 0225   HCT 26.1 (L) 02/02/2020 0225   PLT 264 02/02/2020 0225   MCV 97.8 02/02/2020 0225   MCH 32.2 02/02/2020 0225   MCHC 33.0 02/02/2020 0225   RDW 13.0 02/02/2020 0225   LYMPHSABS 1.6 01/28/2020 0155   MONOABS 1.6 (H) 01/28/2020 0155   EOSABS 0.1 01/28/2020 0155   BASOSABS  0.0 01/28/2020 0155     Chest Imaging: March 2022: CT scan of the chest with 1.5 cm right lower lobe pulmonary nodule concerning for a primary bronchogenic carcinoma. The patient's images have been independently reviewed by me.     Pulmonary Functions Testing Results: No flowsheet data found.  FeNO:   Pathology:   Echocardiogram:   Heart Catheterization:     Assessment & Plan:     ICD-10-CM   1. Lung nodule  R91.1 NM PET Image Initial (PI) Skull Base To Thigh    CT Super D Chest Wo Contrast    Procedural/ Surgical Case Request: VIDEO BRONCHOSCOPY WITH ENDOBRONCHIAL NAVIGATION    Ambulatory referral to Pulmonology  2. Non-smoker  Z78.9   3. PFO (patent foramen ovale)  Q21.1   4. Embolic stroke involving cerebral artery (HCC)  I63.40     Discussion:  This is an 87 year old female referred from thoracic surgery with a right lower lobe 1.5 cm pulmonary nodule that was not present on a CT scan in 2020.  She is a lifelong non-smoker.  We discussed the potential risk for this being a malignancy.  I think the next best steps for evaluation would include a nuclear medicine pet image.  If the pet image is positive and concerning for malignancy then I think we should consider moving forward with video bronchoscopy with navigation possible fiducial placement for a treatment with SBRT.  Plan: We discussed this plan as stated above with the patient today. We discussed the risk benefits and alternatives of navigational bronchoscopy to include bleeding and pneumothorax. We reviewed the patient's images from 2020 and compared them to her 2022 images. I have tentatively scheduled a bronchoscopy for 09/20/2020. Patient's caregiver can only bring her to the hospital on Wednesday Thursday or Friday during the week.  Therefore we will tentatively schedule this next available Friday date. If for some reason the patient's PET scan is considered benign with no uptake we can easily cancel the  procedure. We will go ahead  and move forward with tentative schedule and try to get her PET scan done as soon as possible. We have also ordered a noncontrasted CT chest, super D format for planning of the navigational case.    Current Outpatient Medications:  .  amLODipine (NORVASC) 5 MG tablet, Take 5 mg by mouth daily. , Disp: , Rfl:  .  aspirin EC 81 MG tablet, Take 81 mg by mouth daily., Disp: , Rfl:  .  brimonidine (ALPHAGAN) 0.2 % ophthalmic solution, Place 1 drop into both eyes 3 (three) times daily., Disp: , Rfl:  .  Cholecalciferol (VITAMIN D) 2000 UNITS tablet, Take 4,000 Units by mouth daily., Disp: , Rfl:  .  denosumab (PROLIA) 60 MG/ML SOLN injection, Inject 60 mg into the skin every 6 (six) months. Administer in upper arm, thigh, or abdomen, Disp: , Rfl:  .  dorzolamide-timolol (COSOPT) 22.3-6.8 MG/ML ophthalmic solution, Place 1 drop into both eyes 2 (two) times daily., Disp: , Rfl:  .  lactose free nutrition (BOOST) LIQD, Take 237 mLs by mouth every other day., Disp: , Rfl:  .  loratadine (CLARITIN) 10 MG tablet, Take 10 mg by mouth at bedtime., Disp: , Rfl:  .  Multiple Vitamin (MULTIVITAMIN WITH MINERALS) TABS tablet, Take 1 tablet by mouth daily., Disp: , Rfl:  .  Netarsudil-Latanoprost (ROCKLATAN) 0.02-0.005 % SOLN, Place 1 drop into both eyes daily., Disp: , Rfl:  .  niacin 250 MG tablet, Take 250 mg by mouth daily., Disp: , Rfl:  .  Nutritional Supplements (CARNATION BREAKFAST ESSENTIALS) PACK, Take 1 packet by mouth every other day., Disp: , Rfl:  .  polyethylene glycol (MIRALAX / GLYCOLAX) 17 g packet, Take 17 g by mouth daily as needed for mild constipation., Disp: 14 each, Rfl: 0 .  pravastatin (PRAVACHOL) 80 MG tablet, Take 80 mg by mouth every evening., Disp: , Rfl:   I spent 62 minutes dedicated to the care of this patient on the date of this encounter to include pre-visit review of records, face-to-face time with the patient discussing conditions above, post visit  ordering of testing, clinical documentation with the electronic health record, making appropriate referrals as documented, and communicating necessary findings to members of the patients care team.   Josephine Igo, DO [facility] 09/04/2020 12:05 PM

## 2020-09-04 NOTE — Telephone Encounter (Signed)
Pt's guardian, Unk Pinto, informed of the following:  PET Scan 5/12 at 3:00pm; 2:30pm arrival time. NPO 6 hours prior. Last meal/no carbs @ [facility]  CT 5/12 at 5:00pm after PET @ [facility]  Covid Test 5/17 at 11:30am   Bronch scheduled for 5/20 at 7:30am; 5:30am arrival time @ [facility] ENDO

## 2020-09-04 NOTE — Progress Notes (Signed)
Synopsis: Referred in November 2022 for right lower lobe lung nodule by Linden Dolin, MD  Subjective:   PATIENT ID: Tracey Holmes GENDER: female DOB: 1933-08-28, MRN: [id number]  Chief Complaint  Patient presents with  . Follow-up    Pt is being referred by Dr. Vickey Sages due to lung nodule. Pt denies any complaints of cough or SOB.    This is a 87 year old female with a descending thoracic aneurysm, dyslipidemia, hypertension, history of stroke.  Patient was being evaluated by cardiothoracic surgery for aneurysm and on her follow-up CT imaging was found to have a right lower lobe pulmonary nodule.  This nodule is new in comparison to CT imaging that was completed in September 2020.  Her follow-up images in August 01, 2020 revealed a new 1.5 centimeter right lower lobe peripheral pulmonary nodule.  Patient was referred to [place] for evaluation and considerations for next steps.  Patient is a lifelong non-smoker.  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  She lives alone and able to take care of herself and manage her household.  She does have a neighbor with her today which helps bring her to her appointments as well as mowing her yard.   Past Medical History:  Diagnosis Date  . Aneurysm, descending thoracic aorta    Proximal (1.5 cm)  . Cystic kidney disease   . Dyslipidemia   . HTN (hypertension)   . Personal history of kidney stones   . Stroke [facility]) 924/21     No family history on file.   Past Surgical History:  Procedure Laterality Date  . BUBBLE STUDY  07/03/2020   Procedure: BUBBLE STUDY;  Surgeon: Christell Constant, MD;  Location: [facility] ENDOSCOPY;  Service: Cardiovascular;;  . TEE WITHOUT CARDIOVERSION N/A 07/03/2020   Procedure: TRANSESOPHAGEAL ECHOCARDIOGRAM (TEE);  Surgeon: Christell Constant, MD;  Location: [facility] ENDOSCOPY;  Service: Cardiovascular;  Laterality: N/A;  . TOTAL HIP ARTHROPLASTY Right  01/28/2020   Procedure: TOTAL HIP ARTHROPLASTY ANTERIOR APPROACH;  Surgeon: Samson Frederic, MD;  Location: [facility] OR;  Service: Orthopedics;  Laterality: Right;    Social History   Socioeconomic History  . Marital status: Single    Spouse name: Not on file  . Number of children: Not on file  . Years of education: Not on file  . Highest education level: Not on file  Occupational History  . Not on file  Tobacco Use  . Smoking status: Never Smoker  . Smokeless tobacco: Never Used  Substance and Sexual Activity  . Alcohol use: No  . Drug use: No  . Sexual activity: Not on file  Other Topics Concern  . Not on file  Social History Narrative   Lives alone   Right Handed   Drinks 1 cup caffeine daily   Social Determinants of Health   Financial Resource Strain: Not on file  Food Insecurity: Not on file  Transportation Needs: Not on file  Physical Activity: Not on file  Stress: Not on file  Social Connections: Not on file  Intimate Partner Violence: Not on file     Allergies  Allergen Reactions  . Benicar [Olmesartan] Rash  . Diovan [Valsartan] Rash  . Penicillins Rash     Outpatient Medications Prior to Visit  Medication Sig Dispense Refill  . amLODipine (NORVASC) 5 MG tablet Take 5 mg by mouth daily.     Marland Kitchen aspirin EC 81 MG tablet Take 81  mg by mouth daily.    . brimonidine (ALPHAGAN) 0.2 % ophthalmic solution Place 1 drop into both eyes 3 (three) times daily.    . Cholecalciferol (VITAMIN D) 2000 UNITS tablet Take 4,000 Units by mouth daily.    Marland Kitchen denosumab (PROLIA) 60 MG/ML SOLN injection Inject 60 mg into the skin every 6 (six) months. Administer in upper arm, thigh, or abdomen    . dorzolamide-timolol (COSOPT) 22.3-6.8 MG/ML ophthalmic solution Place 1 drop into both eyes 2 (two) times daily.    Marland Kitchen lactose free nutrition (BOOST) LIQD Take 237 mLs by mouth every other day.    . loratadine (CLARITIN) 10 MG tablet Take 10 mg by mouth at bedtime.    . Multiple Vitamin  (MULTIVITAMIN WITH MINERALS) TABS tablet Take 1 tablet by mouth daily.    . Netarsudil-Latanoprost (ROCKLATAN) 0.02-0.005 % SOLN Place 1 drop into both eyes daily.    . niacin 250 MG tablet Take 250 mg by mouth daily.    . Nutritional Supplements (CARNATION BREAKFAST ESSENTIALS) PACK Take 1 packet by mouth every other day.    . polyethylene glycol (MIRALAX / GLYCOLAX) 17 g packet Take 17 g by mouth daily as needed for mild constipation. 14 each 0  . pravastatin (PRAVACHOL) 80 MG tablet Take 80 mg by mouth every evening.    . Multiple Minerals (CALCIUM-MAGNESIUM-ZINC) TABS Take 1 tablet by mouth daily.     No facility-administered medications prior to visit.    Review of Systems  Constitutional: Negative for chills, fever, malaise/fatigue and weight loss.  HENT: Negative for hearing loss, sore throat and tinnitus.   Eyes: Negative for blurred vision and double vision.  Respiratory: Negative for cough, hemoptysis, sputum production, shortness of breath, wheezing and stridor.   Cardiovascular: Negative for chest pain, palpitations, orthopnea, leg swelling and PND.  Gastrointestinal: Negative for abdominal pain, constipation, diarrhea, heartburn, nausea and vomiting.  Genitourinary: Negative for dysuria, hematuria and urgency.  Musculoskeletal: Negative for joint pain and myalgias.  Skin: Negative for itching and rash.  Neurological: Negative for dizziness, tingling, weakness and headaches.  Endo/Heme/Allergies: Negative for environmental allergies. Does not bruise/bleed easily.  Psychiatric/Behavioral: Negative for depression. The patient is not nervous/anxious and does not have insomnia.   All other systems reviewed and are negative.    Objective:  Physical Exam Vitals reviewed.  Constitutional:      General: She is not in acute distress.    Appearance: She is well-developed.  HENT:     Head: Normocephalic and atraumatic.  Eyes:     General: No scleral icterus.     Conjunctiva/sclera: Conjunctivae normal.     Pupils: Pupils are equal, round, and reactive to light.  Neck:     Vascular: No JVD.     Trachea: No tracheal deviation.  Cardiovascular:     Rate and Rhythm: Normal rate and regular rhythm.     Heart sounds: Normal heart sounds. No murmur heard.   Pulmonary:     Effort: Pulmonary effort is normal. No tachypnea, accessory muscle usage or respiratory distress.     Breath sounds: Normal breath sounds. No stridor. No wheezing, rhonchi or rales.  Abdominal:     General: Bowel sounds are normal. There is no distension.     Palpations: Abdomen is soft.     Tenderness: There is no abdominal tenderness.  Musculoskeletal:        General: No tenderness.     Cervical back: Neck supple.     Comments: Kyphosis  Lymphadenopathy:     Cervical: No cervical adenopathy.  Skin:    General: Skin is warm and dry.     Capillary Refill: Capillary refill takes less than 2 seconds.     Findings: No rash.  Neurological:     Mental Status: She is alert and oriented to person, place, and time.  Psychiatric:        Behavior: Behavior normal.      Vitals:   09/04/20 1137  BP: 120/70  Pulse: 72  Temp: 98.3 F (36.8 C)  TempSrc: Temporal  SpO2: 98%  Weight: 108 lb 6.4 oz (49.2 kg)  Height: 5' 3" (1.6 m)   98% on RA BMI Readings from Last 3 Encounters:  09/04/20 19.20 kg/m  08/01/20 19.49 kg/m  07/03/20 18.78 kg/m   Wt Readings from Last 3 Encounters:  09/04/20 108 lb 6.4 oz (49.2 kg)  08/01/20 110 lb (49.9 kg)  07/03/20 106 lb (48.1 kg)    CBC    Component Value Date/Time   WBC 11.8 (H) 02/02/2020 0225   RBC 2.67 (L) 02/02/2020 0225   HGB 8.6 (L) 02/02/2020 0225   HCT 26.1 (L) 02/02/2020 0225   PLT 264 02/02/2020 0225   MCV 97.8 02/02/2020 0225   MCH 32.2 02/02/2020 0225   MCHC 33.0 02/02/2020 0225   RDW 13.0 02/02/2020 0225   LYMPHSABS 1.6 01/28/2020 0155   MONOABS 1.6 (H) 01/28/2020 0155   EOSABS 0.1 01/28/2020 0155   BASOSABS  0.0 01/28/2020 0155     Chest Imaging: March 2022: CT scan of the chest with 1.5 cm right lower lobe pulmonary nodule concerning for a primary bronchogenic carcinoma. The patient's images have been independently reviewed by me.     Pulmonary Functions Testing Results: No flowsheet data found.  FeNO:   Pathology:   Echocardiogram:   Heart Catheterization:     Assessment & Plan:     ICD-10-CM   1. Lung nodule  R91.1 NM PET Image Initial (PI) Skull Base To Thigh    CT Super D Chest Wo Contrast    Procedural/ Surgical Case Request: VIDEO BRONCHOSCOPY WITH ENDOBRONCHIAL NAVIGATION    Ambulatory referral to Pulmonology  2. Non-smoker  Z78.9   3. PFO (patent foramen ovale)  Q21.1   4. Embolic stroke involving cerebral artery (HCC)  I63.40     Discussion:  This is an 87-year-old female referred from thoracic surgery with a right lower lobe 1.5 cm pulmonary nodule that was not present on a CT scan in 2020.  She is a lifelong non-smoker.  We discussed the potential risk for this being a malignancy.  I think the next best steps for evaluation would include a nuclear medicine pet image.  If the pet image is positive and concerning for malignancy then I think we should consider moving forward with video bronchoscopy with navigation possible fiducial placement for a treatment with SBRT.  Plan: We discussed this plan as stated above with the patient today. We discussed the risk benefits and alternatives of navigational bronchoscopy to include bleeding and pneumothorax. We reviewed the patient's images from 2020 and compared them to her 2022 images. I have tentatively scheduled a bronchoscopy for 09/20/2020. Patient's caregiver can only bring her to the hospital on Wednesday Thursday or Friday during the week.  Therefore we will tentatively schedule this next available Friday date. If for some reason the patient's PET scan is considered benign with no uptake we can easily cancel the  procedure. We will go ahead   and move forward with tentative schedule and try to get her PET scan done as soon as possible. We have also ordered a noncontrasted CT chest, super D format for planning of the navigational case.    Current Outpatient Medications:  .  amLODipine (NORVASC) 5 MG tablet, Take 5 mg by mouth daily. , Disp: , Rfl:  .  aspirin EC 81 MG tablet, Take 81 mg by mouth daily., Disp: , Rfl:  .  brimonidine (ALPHAGAN) 0.2 % ophthalmic solution, Place 1 drop into both eyes 3 (three) times daily., Disp: , Rfl:  .  Cholecalciferol (VITAMIN D) 2000 UNITS tablet, Take 4,000 Units by mouth daily., Disp: , Rfl:  .  denosumab (PROLIA) 60 MG/ML SOLN injection, Inject 60 mg into the skin every 6 (six) months. Administer in upper arm, thigh, or abdomen, Disp: , Rfl:  .  dorzolamide-timolol (COSOPT) 22.3-6.8 MG/ML ophthalmic solution, Place 1 drop into both eyes 2 (two) times daily., Disp: , Rfl:  .  lactose free nutrition (BOOST) LIQD, Take 237 mLs by mouth every other day., Disp: , Rfl:  .  loratadine (CLARITIN) 10 MG tablet, Take 10 mg by mouth at bedtime., Disp: , Rfl:  .  Multiple Vitamin (MULTIVITAMIN WITH MINERALS) TABS tablet, Take 1 tablet by mouth daily., Disp: , Rfl:  .  Netarsudil-Latanoprost (ROCKLATAN) 0.02-0.005 % SOLN, Place 1 drop into both eyes daily., Disp: , Rfl:  .  niacin 250 MG tablet, Take 250 mg by mouth daily., Disp: , Rfl:  .  Nutritional Supplements (CARNATION BREAKFAST ESSENTIALS) PACK, Take 1 packet by mouth every other day., Disp: , Rfl:  .  polyethylene glycol (MIRALAX / GLYCOLAX) 17 g packet, Take 17 g by mouth daily as needed for mild constipation., Disp: 14 each, Rfl: 0 .  pravastatin (PRAVACHOL) 80 MG tablet, Take 80 mg by mouth every evening., Disp: , Rfl:   I spent 62 minutes dedicated to the care of this patient on the date of this encounter to include pre-visit review of records, face-to-face time with the patient discussing conditions above, post visit  ordering of testing, clinical documentation with the electronic health record, making appropriate referrals as documented, and communicating necessary findings to members of the patients care team.   Tracey Igo, DO [facility] 09/04/2020 12:05 PM

## 2020-09-04 NOTE — Patient Instructions (Signed)
Thank you for visiting Dr. Tonia Brooms at [facility]. Today we recommend the following:  Orders Placed This Encounter  Procedures  . Procedural/ Surgical Case Request: VIDEO BRONCHOSCOPY WITH ENDOBRONCHIAL NAVIGATION  . NM PET Image Initial (PI) Skull Base To Thigh  . CT Super D Chest Wo Contrast  . Ambulatory referral to Pulmonology   Return in about 2 months (around 11/04/2020).    Please do your part to reduce the spread of COVID-19.

## 2020-09-10 ENCOUNTER — Ambulatory Visit: Payer: Medicare Other | Admitting: Adult Health

## 2020-09-12 ENCOUNTER — Other Ambulatory Visit: Payer: Self-pay

## 2020-09-12 ENCOUNTER — Ambulatory Visit (HOSPITAL_COMMUNITY)
Admission: RE | Admit: 2020-09-12 | Discharge: 2020-09-12 | Disposition: A | Discharge status: Home / Self Care | Payer: Medicare Other | Source: Ambulatory Visit | Attending: Pulmonary Disease | Admitting: Pulmonary Disease

## 2020-09-12 ENCOUNTER — Encounter (HOSPITAL_COMMUNITY): Payer: Self-pay

## 2020-09-12 DIAGNOSIS — R911 Solitary pulmonary nodule: Secondary | ICD-10-CM

## 2020-09-12 DIAGNOSIS — J439 Emphysema, unspecified: Secondary | ICD-10-CM | POA: Diagnosis not present

## 2020-09-12 DIAGNOSIS — I2510 Atherosclerotic heart disease of native coronary artery without angina pectoris: Secondary | ICD-10-CM | POA: Insufficient documentation

## 2020-09-12 DIAGNOSIS — K449 Diaphragmatic hernia without obstruction or gangrene: Secondary | ICD-10-CM | POA: Insufficient documentation

## 2020-09-12 DIAGNOSIS — I700 Atherosclerosis of aorta: Secondary | ICD-10-CM | POA: Insufficient documentation

## 2020-09-12 LAB — GLUCOSE, CAPILLARY: Glucose-Capillary: 100 mg/dL — ABNORMAL HIGH (ref 70–99)

## 2020-09-12 MED ORDER — FLUDEOXYGLUCOSE F - 18 (FDG) INJECTION
5.8700 | Freq: Once | INTRAVENOUS | Status: AC
  Administered 2020-09-12: 5.87 via INTRAVENOUS

## 2020-09-13 ENCOUNTER — Telehealth: Payer: Self-pay | Admitting: Pulmonary Disease

## 2020-09-13 NOTE — Telephone Encounter (Signed)
Noted.  Will close encounter.  

## 2020-09-13 NOTE — Telephone Encounter (Signed)
Results are back. I called her. We will plan for case as scheduled.  Josephine Igo, DO  Pulmonary Critical Care 09/13/2020 4:37 PM

## 2020-09-13 NOTE — Telephone Encounter (Signed)
Called and spoke with pt letting her know the info stated by Dr. Tonia Brooms and stated to her that we would call her once we had the results. Pt verbalized understanding.

## 2020-09-13 NOTE — Telephone Encounter (Signed)
Called and spoke with pt who is requesting to know if we have received the results yet on pt's CT and PET that she had done. Pt is currently scheduled for the bronch 5/20 and wants to know if that is still what is going to be the plan.  Pt is scheduled for the covid test 5/17 but at last OV, it was stated if the PET was negative that the bronch might be able to be cancelled.  Dr. Tonia Brooms, please advise on this for pt.

## 2020-09-16 ENCOUNTER — Telehealth: Payer: Self-pay | Admitting: *Deleted

## 2020-09-16 NOTE — Telephone Encounter (Signed)
Per chart appointment for pre procedure she is to have Covid testing Tuesday morning at the [address] location.

## 2020-09-17 ENCOUNTER — Other Ambulatory Visit (HOSPITAL_COMMUNITY)
Admission: RE | Admit: 2020-09-17 | Discharge: 2020-09-17 | Disposition: A | Discharge status: Home / Self Care | Payer: Medicare Other | Source: Ambulatory Visit | Attending: Pulmonary Disease | Admitting: Pulmonary Disease

## 2020-09-17 DIAGNOSIS — Z01812 Encounter for preprocedural laboratory examination: Secondary | ICD-10-CM | POA: Insufficient documentation

## 2020-09-17 DIAGNOSIS — Z20822 Contact with and (suspected) exposure to covid-19: Secondary | ICD-10-CM | POA: Diagnosis not present

## 2020-09-17 LAB — SARS CORONAVIRUS 2 (TAT 6-24 HRS): SARS Coronavirus 2: NEGATIVE

## 2020-09-18 ENCOUNTER — Encounter (HOSPITAL_COMMUNITY): Payer: Self-pay | Admitting: Pulmonary Disease

## 2020-09-18 ENCOUNTER — Other Ambulatory Visit: Payer: Self-pay

## 2020-09-18 NOTE — Progress Notes (Signed)
PCP - Laurann Montana Cardiologist - Lanier Prude  PPM/ICD - denies   Chest x-ray - n/a EKG - 01/30/20 Stress Test - denies ECHO - 07/03/20 Cardiac Cath - denies  CPAP - denies  No Diabetes  Patient instructed to hold all Aspirin, NSAID's, herbal medications, fish oil and vitamins 7 days prior to surgery.   ERAS Protcol - no  COVID TEST- 09/17/20- negative  Anesthesia review: yes, cardiac history  Patient verbally denies any shortness of breath, fever, cough and chest pain during phone call   -------------  SDW INSTRUCTIONS given:  Your procedure is scheduled on 09/20/20.  Report to [facility] Main Entrance "A" at 05:30 A.M., and check in at the Admitting office.  Call this number if you have problems the morning of surgery:  [phone]   Remember:  Do not eat or drink after midnight the night before your surgery    Take these medicines the morning of surgery with A SIP OF WATER  acetaminophen (TYLENOL)  amLODipine (NORVASC)  brimonidine (ALPHAGAN)  dorzolamide-timolol (COSOPT)    As of today, STOP taking any Aspirin (unless otherwise instructed by your surgeon) Aleve, Naproxen, Ibuprofen, Motrin, Advil, Goody's, BC's, all herbal medications, fish oil, and all vitamins.                      Do not wear jewelry, make up, or nail polish            Do not wear lotions, powders, perfumes/colognes, or deodorant.            Do not shave 48 hours prior to surgery.              Do not bring valuables to the hospital.            [facility] is not responsible for any belongings or valuables.  Do NOT Smoke (Tobacco/Vaping) or drink Alcohol 24 hours prior to your procedure If you use a CPAP at night, you may bring all equipment for your overnight stay.   Contacts, glasses, dentures or bridgework may not be worn into surgery.      For patients admitted to the hospital, discharge time will be determined by your treatment team.   Patients discharged the day of  surgery will not be allowed to drive home, and someone needs to stay with them for 24 hours.    Special instructions:   [facility]- Preparing For Surgery  Before surgery, you can play an important role. Because skin is not sterile, your skin needs to be as free of germs as possible. You can reduce the number of germs on your skin by washing with CHG (chlorahexidine gluconate) Soap before surgery.  CHG is an antiseptic cleaner which kills germs and bonds with the skin to continue killing germs even after washing.    Oral Hygiene is also important to reduce your risk of infection.  Remember - BRUSH YOUR TEETH THE MORNING OF SURGERY WITH YOUR REGULAR TOOTHPASTE  Please do not use if you have an allergy to CHG or antibacterial soaps. If your skin becomes reddened/irritated stop using the CHG.  Do not shave (including legs and underarms) for at least 48 hours prior to first CHG shower. It is OK to shave your face.  Please follow these instructions carefully.   1. Shower the NIGHT BEFORE SURGERY and the MORNING OF SURGERY with DIAL Soap.   2. Pat yourself dry with a CLEAN TOWEL.  3. Wear CLEAN PAJAMAS  to bed the night before surgery  4. Place CLEAN SHEETS on your bed the night of your first shower and DO NOT SLEEP WITH PETS.   Day of Surgery: Please shower morning of surgery  Wear Clean/Comfortable clothing the morning of surgery Do not apply any deodorants/lotions.   Remember to brush your teeth WITH YOUR REGULAR TOOTHPASTE.   Questions were answered. Patient verbalized understanding of instructions.

## 2020-09-20 ENCOUNTER — Encounter (HOSPITAL_COMMUNITY)
Admission: RE | Disposition: A | Discharge status: Home / Self Care | Payer: Self-pay | Source: Home / Self Care | Attending: Pulmonary Disease

## 2020-09-20 ENCOUNTER — Ambulatory Visit (HOSPITAL_COMMUNITY): Payer: Medicare Other | Admitting: Certified Registered Nurse Anesthetist

## 2020-09-20 ENCOUNTER — Ambulatory Visit (HOSPITAL_COMMUNITY): Payer: Medicare Other

## 2020-09-20 ENCOUNTER — Ambulatory Visit (HOSPITAL_COMMUNITY)
Admission: RE | Admit: 2020-09-20 | Discharge: 2020-09-20 | Disposition: A | Discharge status: Home / Self Care | Payer: Medicare Other | Attending: Pulmonary Disease | Admitting: Pulmonary Disease

## 2020-09-20 ENCOUNTER — Encounter (HOSPITAL_COMMUNITY): Payer: Self-pay | Admitting: Pulmonary Disease

## 2020-09-20 DIAGNOSIS — Q211 Atrial septal defect: Secondary | ICD-10-CM | POA: Insufficient documentation

## 2020-09-20 DIAGNOSIS — I10 Essential (primary) hypertension: Secondary | ICD-10-CM | POA: Insufficient documentation

## 2020-09-20 DIAGNOSIS — R918 Other nonspecific abnormal finding of lung field: Secondary | ICD-10-CM | POA: Diagnosis not present

## 2020-09-20 DIAGNOSIS — R911 Solitary pulmonary nodule: Secondary | ICD-10-CM | POA: Diagnosis not present

## 2020-09-20 DIAGNOSIS — Z9889 Other specified postprocedural states: Secondary | ICD-10-CM | POA: Diagnosis not present

## 2020-09-20 DIAGNOSIS — Z7982 Long term (current) use of aspirin: Secondary | ICD-10-CM | POA: Insufficient documentation

## 2020-09-20 DIAGNOSIS — E785 Hyperlipidemia, unspecified: Secondary | ICD-10-CM | POA: Insufficient documentation

## 2020-09-20 DIAGNOSIS — Z79899 Other long term (current) drug therapy: Secondary | ICD-10-CM | POA: Insufficient documentation

## 2020-09-20 DIAGNOSIS — Z419 Encounter for procedure for purposes other than remedying health state, unspecified: Secondary | ICD-10-CM | POA: Diagnosis not present

## 2020-09-20 DIAGNOSIS — I712 Thoracic aortic aneurysm, without rupture: Secondary | ICD-10-CM | POA: Insufficient documentation

## 2020-09-20 DIAGNOSIS — Z8673 Personal history of transient ischemic attack (TIA), and cerebral infarction without residual deficits: Secondary | ICD-10-CM | POA: Insufficient documentation

## 2020-09-20 DIAGNOSIS — Z96641 Presence of right artificial hip joint: Secondary | ICD-10-CM | POA: Diagnosis not present

## 2020-09-20 DIAGNOSIS — E782 Mixed hyperlipidemia: Secondary | ICD-10-CM | POA: Diagnosis not present

## 2020-09-20 HISTORY — DX: Unspecified osteoarthritis, unspecified site: M19.90

## 2020-09-20 HISTORY — PX: BRONCHIAL NEEDLE ASPIRATION BIOPSY: SHX5106

## 2020-09-20 HISTORY — PX: BRONCHIAL WASHINGS: SHX5105

## 2020-09-20 HISTORY — PX: BRONCHIAL BIOPSY: SHX5109

## 2020-09-20 HISTORY — DX: Personal history of urinary calculi: Z87.442

## 2020-09-20 HISTORY — PX: BRONCHIAL BRUSHINGS: SHX5108

## 2020-09-20 HISTORY — PX: VIDEO BRONCHOSCOPY WITH ENDOBRONCHIAL NAVIGATION: SHX6175

## 2020-09-20 SURGERY — VIDEO BRONCHOSCOPY WITH ENDOBRONCHIAL NAVIGATION
Anesthesia: General | Laterality: Right

## 2020-09-20 MED ORDER — PHENYLEPHRINE HCL-NACL 10-0.9 MG/250ML-% IV SOLN
INTRAVENOUS | Status: DC | PRN
  Administered 2020-09-20: 25 ug/min via INTRAVENOUS

## 2020-09-20 MED ORDER — LACTATED RINGERS IV SOLN: INTRAVENOUS | Status: DC

## 2020-09-20 MED ORDER — PROPOFOL 10 MG/ML IV BOLUS
INTRAVENOUS | Status: DC | PRN
  Administered 2020-09-20: 90 mg via INTRAVENOUS

## 2020-09-20 MED ORDER — CHLORHEXIDINE GLUCONATE 0.12 % MT SOLN
OROMUCOSAL | Status: AC
  Administered 2020-09-20: 15 mL
  Filled 2020-09-20: qty 15

## 2020-09-20 MED ORDER — ROCURONIUM BROMIDE 10 MG/ML (PF) SYRINGE
PREFILLED_SYRINGE | INTRAVENOUS | Status: DC | PRN
  Administered 2020-09-20: 40 mg via INTRAVENOUS

## 2020-09-20 MED ORDER — FENTANYL CITRATE (PF) 250 MCG/5ML IJ SOLN
INTRAMUSCULAR | Status: DC | PRN
  Administered 2020-09-20: 50 ug via INTRAVENOUS

## 2020-09-20 MED ORDER — DEXAMETHASONE SODIUM PHOSPHATE 10 MG/ML IJ SOLN
INTRAMUSCULAR | Status: DC | PRN
  Administered 2020-09-20: 10 mg via INTRAVENOUS

## 2020-09-20 MED ORDER — LIDOCAINE 2% (20 MG/ML) 5 ML SYRINGE
INTRAMUSCULAR | Status: DC | PRN
  Administered 2020-09-20: 60 mg via INTRAVENOUS

## 2020-09-20 MED ORDER — ONDANSETRON HCL 4 MG/2ML IJ SOLN
INTRAMUSCULAR | Status: DC | PRN
  Administered 2020-09-20: 4 mg via INTRAVENOUS

## 2020-09-20 MED ORDER — PHENYLEPHRINE 40 MCG/ML (10ML) SYRINGE FOR IV PUSH (FOR BLOOD PRESSURE SUPPORT)
PREFILLED_SYRINGE | INTRAVENOUS | Status: DC | PRN
  Administered 2020-09-20: 80 ug via INTRAVENOUS

## 2020-09-20 MED ORDER — SUGAMMADEX SODIUM 200 MG/2ML IV SOLN
INTRAVENOUS | Status: DC | PRN
  Administered 2020-09-20: 100 mg via INTRAVENOUS

## 2020-09-20 SURGICAL SUPPLY — 45 items

## 2020-09-20 NOTE — Anesthesia Postprocedure Evaluation (Signed)
Anesthesia Post Note  Patient: Tracey Holmes  Procedure(s) Performed: VIDEO BRONCHOSCOPY WITH ENDOBRONCHIAL NAVIGATION (Right ) BRONCHIAL BIOPSIES BRONCHIAL BRUSHINGS BRONCHIAL NEEDLE ASPIRATION BIOPSIES BRONCHIAL WASHINGS     Patient location during evaluation: PACU Anesthesia Type: General Level of consciousness: awake and alert Pain management: pain level controlled Vital Signs Assessment: post-procedure vital signs reviewed and stable Respiratory status: spontaneous breathing, nonlabored ventilation, respiratory function stable and patient connected to nasal cannula oxygen Cardiovascular status: blood pressure returned to baseline and stable Postop Assessment: no apparent nausea or vomiting Anesthetic complications: no   No complications documented.  Last Vitals:  Vitals:   09/20/20 0915 09/20/20 0930  BP: (!) 154/80 (!) 155/91  Pulse: 71 76  Resp: 16 18  Temp: 36.4 C   SpO2: 99% 99%    Last Pain:  Vitals:   09/20/20 0915  TempSrc:   PainSc: 0-No pain                 Kennieth Rad

## 2020-09-20 NOTE — Transfer of Care (Signed)
Immediate Anesthesia Transfer of Care Note  Patient: Tracey Holmes  Procedure(s) Performed: VIDEO BRONCHOSCOPY WITH ENDOBRONCHIAL NAVIGATION (Right ) BRONCHIAL BIOPSIES BRONCHIAL BRUSHINGS BRONCHIAL NEEDLE ASPIRATION BIOPSIES BRONCHIAL WASHINGS  Patient Location: PACU  Anesthesia Type:General  Level of Consciousness: drowsy  Airway & Oxygen Therapy: Patient Spontanous Breathing and Patient connected to face mask oxygen  Post-op Assessment: Report given to RN and Post -op Vital signs reviewed and stable  Post vital signs: Reviewed and stable  Last Vitals:  Vitals Value Taken Time  BP 151/74 09/20/20 0844  Temp    Pulse 67 09/20/20 0845  Resp 15 09/20/20 0845  SpO2 100 % 09/20/20 0845  Vitals shown include unvalidated device data.  Last Pain:  Vitals:   09/20/20 0600  TempSrc: Oral         Complications: No complications documented.

## 2020-09-20 NOTE — Op Note (Signed)
Video Bronchoscopy with Electromagnetic Navigation Procedure Note  Date of Operation: 09/20/2020  Pre-op Diagnosis: Right lower lobe lung nodule  Post-op Diagnosis: Right lower lobe lung nodule  Surgeon: Garner Nash, DO   Assistants: None   Anesthesia: General endotracheal anesthesia  Operation: Flexible video fiberoptic bronchoscopy with electromagnetic navigation and biopsies.  Estimated Blood Loss: Minimal  Complications: None   Indications and History: Tracey Holmes is a 87 y.o. female with right lower lobe lung nodule.  The risks, benefits, complications, treatment options and expected outcomes were discussed with the patient.  The possibilities of pneumothorax, pneumonia, reaction to medication, pulmonary aspiration, perforation of a viscus, bleeding, failure to diagnose a condition and creating a complication requiring transfusion or operation were discussed with the patient who freely signed the consent.   Description of Procedure: The patient was seen in the Preoperative Area, was examined and was deemed appropriate to proceed.  The patient was taken to [facility] endoscopy room 2, identified as Claiborne Rigg and the procedure verified as Flexible Video Fiberoptic Bronchoscopy.  A Time Out was held and the above information confirmed.   Prior to the date of the procedure a high-resolution CT scan of the chest was performed. Utilizing [place] a virtual tracheobronchial tree was generated to allow the creation of distinct navigation pathways to the patient's parenchymal abnormalities. After being taken to the operating room general anesthesia was initiated and the patient  was orally intubated. The video fiberoptic bronchoscope was introduced via the endotracheal tube and a general inspection was performed which showed normal right and left lung anatomy no evidence of endobronchial lesion. The extendable working channel and locator guide were introduced into the  bronchoscope. The distinct navigation pathways prepared prior to this procedure were then utilized to navigate to within 0.6 cm of patient's lesion(s) identified on CT scan.  Full fluoroscopic sleep was obtained from RAO 25 degrees to LAO 25 degrees with inspiratory breath-hold APL at 20 cm water. The extendable working channel was secured into place and the locator guide was withdrawn. Under fluoroscopic guidance transbronchial needle brushings, transbronchial Wang needle biopsies, and transbronchial forceps biopsies were performed to be sent for cytology and pathology. A bronchioalveolar lavage was performed in the right lower lobe and sent for cytology and microbiology (bacterial, fungal, AFB smears and cultures). At the end of the procedure a general airway inspection was performed and there was no evidence of active bleeding.  Standard therapeutic bronchoscope was used for aspiration of the bilateral mainstem's removal and remaining blood clots and secretions or debris.  All distal subsegments were patent at the termination of procedure and there was no evidence of active bleeding. The bronchoscope was removed.  The patient tolerated the procedure well. There was no significant blood loss and there were no obvious complications. A post-procedural chest x-ray is pending.  Samples: 1. Transbronchial needle brushings from RLL 2. Transbronchial Wang needle biopsies from RLL 3. Transbronchial forceps biopsies from RLL 4. Bronchoalveolar lavage from RLL  Plans:  The patient will be discharged from the PACU to home when recovered from anesthesia and after chest x-ray is reviewed. We will review the cytology, pathology and microbiology results with the patient when they become available. Outpatient followup will be with Garner Nash, [place] Vasil Juhasz, DO [facility] 09/20/2020 8:39 AM

## 2020-09-20 NOTE — Anesthesia Preprocedure Evaluation (Signed)
Anesthesia Evaluation  Patient identified by MRN, date of birth, ID band Patient awake    Reviewed: Allergy & Precautions, NPO status , Patient's Chart, lab work & pertinent test results  Airway Mallampati: II  TM Distance: >3 FB Neck ROM: Full    Dental  (+) Dental Advisory Given   Pulmonary  Lung nodule   breath sounds clear to auscultation       Cardiovascular hypertension, Pt. on medications + Peripheral Vascular Disease   Rhythm:Regular Rate:Normal  Normal EF. Trivial AI, Asc AO mildly dilated at 4.0cm. Moderate sized PFO with right to left.   Neuro/Psych CVA    GI/Hepatic negative GI ROS, Neg liver ROS,   Endo/Other  negative endocrine ROS  Renal/GU Renal disease     Musculoskeletal  (+) Arthritis ,   Abdominal   Peds  Hematology negative hematology ROS (+)   Anesthesia Other Findings   Reproductive/Obstetrics                             Anesthesia Physical Anesthesia Plan  ASA: III  Anesthesia Plan: General   Post-op Pain Management:    Induction: Intravenous  PONV Risk Score and Plan: 3 and Ondansetron, Dexamethasone and Treatment may vary due to age or medical condition  Airway Management Planned: Oral ETT  Additional Equipment: None  Intra-op Plan:   Post-operative Plan: Extubation in OR  Informed Consent: I have reviewed the patients History and Physical, chart, labs and discussed the procedure including the risks, benefits and alternatives for the proposed anesthesia with the patient or authorized representative who has indicated his/her understanding and acceptance.     Dental advisory given  Plan Discussed with: CRNA  Anesthesia Plan Comments:         Anesthesia Quick Evaluation

## 2020-09-20 NOTE — Discharge Instructions (Signed)
Flexible Bronchoscopy, Care After This sheet gives you information about how to care for yourself after your test. Your doctor may also give you more specific instructions. If you have problems or questions, contact your doctor. Follow these instructions at home: Eating and drinking  Do not eat or drink anything (not even water) for 2 hours after your test, or until your numbing medicine (local anesthetic) wears off.  When your numbness is gone and your cough and gag reflexes have come back, you may: ? Eat only soft foods. ? Slowly drink liquids.  The day after the test, go back to your normal diet. Driving  Do not drive for 24 hours if you were given a medicine to help you relax (sedative).  Do not drive or use heavy machinery while taking prescription pain medicine. General instructions   Take over-the-counter and prescription medicines only as told by your doctor.  Return to your normal activities as told. Ask what activities are safe for you.  Do not use any products that have nicotine or tobacco in them. This includes cigarettes and e-cigarettes. If you need help quitting, ask your doctor.  Keep all follow-up visits as told by your doctor. This is important. It is very important if you had a tissue sample (biopsy) taken. Get help right away if:  You have shortness of breath that gets worse.  You get light-headed.  You feel like you are going to pass out (faint).  You have chest pain.  You cough up: ? More than a little blood. ? More blood than before. Summary  Do not eat or drink anything (not even water) for 2 hours after your test, or until your numbing medicine wears off.  Do not use cigarettes. Do not use e-cigarettes.  Get help right away if you have chest pain.   This information is not intended to replace advice given to you by your health care provider. Make sure you discuss any questions you have with your health care provider. Document Released:  02/15/2009 Document Revised: 04/02/2017 Document Reviewed: 05/08/2016 Elsevier Patient Education  2020 [employer].

## 2020-09-20 NOTE — Interval H&P Note (Signed)
History and Physical Interval Note:  09/20/2020 7:01 AM  Tracey Holmes  has presented today for surgery, with the diagnosis of lung nodule.  The various methods of treatment have been discussed with the patient and family. After consideration of risks, benefits and other options for treatment, the patient has consented to  Procedure(s): VIDEO BRONCHOSCOPY WITH ENDOBRONCHIAL NAVIGATION (Right) as a surgical intervention.  The patient's history has been reviewed, patient examined, no change in status, stable for surgery.  I have reviewed the patient's chart and labs.  Questions were answered to the patient's satisfaction.     Rachel Bo Laloni Rowton

## 2020-09-20 NOTE — Anesthesia Procedure Notes (Signed)
Procedure Name: Intubation Date/Time: 09/20/2020 7:37 AM Performed by: Candis Shine, CRNA Pre-anesthesia Checklist: Patient identified, Emergency Drugs available, Suction available and Patient being monitored Patient Re-evaluated:Patient Re-evaluated prior to induction Oxygen Delivery Method: Circle System Utilized Preoxygenation: Pre-oxygenation with 100% oxygen Induction Type: IV induction Ventilation: Mask ventilation without difficulty Laryngoscope Size: Mac and 3 Grade View: Grade I Tube type: Oral Tube size: 8.0 mm Number of attempts: 1 Airway Equipment and Method: Stylet and Oral airway Placement Confirmation: ETT inserted through vocal cords under direct vision,  positive ETCO2 and breath sounds checked- equal and bilateral Secured at: 20 cm Tube secured with: Tape Dental Injury: Teeth and Oropharynx as per pre-operative assessment

## 2020-09-21 LAB — ACID FAST SMEAR (AFB, MYCOBACTERIA): Acid Fast Smear: NEGATIVE

## 2020-09-22 ENCOUNTER — Encounter (HOSPITAL_COMMUNITY): Payer: Self-pay | Admitting: Pulmonary Disease

## 2020-09-22 LAB — CULTURE, BAL-QUANTITATIVE W GRAM STAIN: Culture: NO GROWTH

## 2020-09-23 LAB — CYTOLOGY - NON PAP

## 2020-09-25 LAB — AEROBIC/ANAEROBIC CULTURE W GRAM STAIN (SURGICAL/DEEP WOUND): Culture: NO GROWTH

## 2020-09-27 ENCOUNTER — Telehealth: Payer: Self-pay | Admitting: Pulmonary Disease

## 2020-09-27 NOTE — Telephone Encounter (Signed)
Called and spoke with Patient.  Patient is concerned with her bronchoscopy results.  Advised that some testing results do take longer to come back then others. Patient is concerned she may have cancer. Advised Patient I would send Dr. Tonia Brooms a message to let him know she is concerned and worried about bronchoscopy results.   Message routed to Dr. Tonia Brooms to advise

## 2020-09-27 NOTE — Telephone Encounter (Signed)
Noted! Thank you

## 2020-09-27 NOTE — Telephone Encounter (Signed)
PCCM:  I called and spoke with patient regarding results.  Josephine Igo, DO [facility] 09/27/2020 11:47 AM

## 2020-10-01 NOTE — Progress Notes (Signed)
Cardiology Office Note    Date:  10/15/2020   ID:  Erle Crocker, DOB Feb 07, 1934, MRN [id number]   PCP:  Laurann Montana, MD   [facility]  Cardiologist:  Christell Constant, MD  Advanced Practice Provider:  No care team member to display Electrophysiologist:  None   [phone]   Chief Complaint  Patient presents with   Follow-up    History of Present Illness:  Tracey Holmes is a 87 y.o. female with a hx of HTN, HLD, TAAA (mild 4.0 cm) followed by Dr. Alla German, embolic stroke 03/2020 and was found to have a PFO.  She underwent TEE 07/03/2020 and this was reviewed with the PFO closure team, Dr. Excell Seltzer and Dr. Pearlean Brownie who did not feel she needed this closed.  Patient comes in with a friend. Denies chest pain, dyspnea, dizziness or presyncope. Lives alone, gaining strength back, doing cooking and housework. Had a lung biopsy and no malignant cells on path.     Past Medical History:  Diagnosis Date   Aneurysm, descending thoracic aorta    Proximal (1.5 cm)   Arthritis    right knee   Cystic kidney disease    Dyslipidemia    History of kidney stones    HTN (hypertension)    Personal history of kidney stones    Stroke [facility]) 924/21    Past Surgical History:  Procedure Laterality Date   BRONCHIAL BIOPSY  09/20/2020   Procedure: BRONCHIAL BIOPSIES;  Surgeon: Josephine Igo, DO;  Location: [facility] ENDOSCOPY;  Service: Pulmonary;;   BRONCHIAL BRUSHINGS  09/20/2020   Procedure: BRONCHIAL BRUSHINGS;  Surgeon: Josephine Igo, DO;  Location: [facility] ENDOSCOPY;  Service: Pulmonary;;   BRONCHIAL NEEDLE ASPIRATION BIOPSY  09/20/2020   Procedure: BRONCHIAL NEEDLE ASPIRATION BIOPSIES;  Surgeon: Josephine Igo, DO;  Location: [facility]OS[facility];  Service: Pulmonary;;   BRONCHIAL WASHINGS  09/20/2020   Procedure: BRONCHIAL WASHINGS;  Surgeon: Josephine Igo, DO;  Location: [facility] ENDOS[facility];  Service: Pulmonary;;   BUBBLE STUDY  07/03/2020   Procedure: BUBBLE STUDY;   Surgeon: Christell Constant, MD;  Location: [facility] ENDOSCOPY;  Service: Cardiovascular;;   JOINT REPLACEMENT     TEE WITHOUT CARDIOVERSION N/A 07/03/2020   Procedure: TRANSESOPHAGEAL ECHOCARDIOGRAM (TEE);  Surgeon: Christell Constant, MD;  Location: [facility] ENDOSCOPY;  Service: Cardiovascular;  Laterality: N/A;   TONSILLECTOMY     age 35 per patient   TOTAL HIP ARTHROPLASTY Right 01/28/2020   Procedure: TOTAL HIP ARTHROPLASTY ANTERIOR APPROACH;  Surgeon: Samson Frederic, MD;  Location: [facility] OR;  Service: Orthopedics;  Laterality: Right;   VIDEO BRONCHOSCOPY WITH ENDOBRONCHIAL NAVIGATION Right 09/20/2020   Procedure: VIDEO BRONCHOSCOPY WITH ENDOBRONCHIAL NAVIGATION;  Surgeon: Josephine Igo, DO;  Location: [facility] ENDOS[facility];  Service: Pulmonary;  Laterality: Right;    Current Medications: Current Meds  Medication Sig   acetaminophen (TYLENOL) 500 MG tablet Take 500 mg by mouth 2 (two) times daily as needed (pain.).   amLODipine (NORVASC) 5 MG tablet Take 5 mg by mouth in the morning.   aspirin EC 81 MG tablet Take 81 mg by mouth in the morning.   brimonidine (ALPHAGAN) 0.2 % ophthalmic solution Place 1 drop into both eyes 3 (three) times daily.   Cholecalciferol (VITAMIN D) 2000 UNITS tablet Take 2,000 Units by mouth in the morning.   denosumab (PROLIA) 60 MG/ML SOLN injection Inject 60 mg into the skin every 6 (six) months. Administer in upper arm, thigh, or abdomen   dorzolamide-timolol (  COSOPT) 22.3-6.8 MG/ML ophthalmic solution Place 1 drop into both eyes 2 (two) times daily.   lactose free nutrition (BOOST) LIQD Take 237 mLs by mouth every other day.   loratadine (CLARITIN) 10 MG tablet Take 10 mg by mouth at bedtime.   Multiple Vitamin (MULTIVITAMIN WITH MINERALS) TABS tablet Take 1 tablet by mouth in the morning. One-A-Day   Netarsudil-Latanoprost (ROCKLATAN) 0.02-0.005 % SOLN Place 1 drop into both eyes at bedtime.   niacin 250 MG tablet Take 250 mg by mouth in the morning.   Nutritional  Supplements (CARNATION BREAKFAST ESSENTIALS) PACK Take 1 packet by mouth every other day.   polyethylene glycol (MIRALAX / GLYCOLAX) 17 g packet Take 17 g by mouth daily as needed for mild constipation.   pravastatin (PRAVACHOL) 80 MG tablet Take 80 mg by mouth every evening.     Allergies:   Benicar [olmesartan], Diovan [valsartan], and Penicillins   Social History   Socioeconomic History   Marital status: Single    Spouse name: Not on file   Number of children: Not on file   Years of education: Not on file   Highest education level: Not on file  Occupational History   Not on file  Tobacco Use   Smoking status: Never   Smokeless tobacco: Never  Vaping Use   Vaping Use: Never used  Substance and Sexual Activity   Alcohol use: No   Drug use: No   Sexual activity: Not on file  Other Topics Concern   Not on file  Social History Narrative   Lives alone   Right Handed   Drinks 1 cup caffeine daily   Social Determinants of Health   Financial Resource Strain: Not on file  Food Insecurity: Not on file  Transportation Needs: Not on file  Physical Activity: Not on file  Stress: Not on file  Social Connections: Not on file     Family History:  The patient's family history is not on file.   ROS:   Please see the history of present illness.    ROS All other systems reviewed and are negative.   PHYSICAL EXAM:   VS:  BP 118/68   Pulse 70   Ht 5\' 3"  (1.6 m)   Wt 107 lb 12.8 oz (48.9 kg)   SpO2 99%   BMI 19.10 kg/m   Physical Exam  GEN: Thin, in no acute distress  Neck: no JVD, carotid bruits, or masses Cardiac:RRR; no murmurs, rubs, or gallops  Respiratory: Reduced breath sounds at the left lung base with some wheezing otherwise clear  GI: soft, nontender, nondistended, + BS Ext: without cyanosis, clubbing, or edema, Good distal pulses bilaterally Neuro:  Alert and Oriented x 3, Psych: euthymic mood, full affect  Wt Readings from Last 3 Encounters:  10/15/20 107  lb 12.8 oz (48.9 kg)  09/20/20 108 lb 0.4 oz (49 kg)  09/04/20 108 lb 6.4 oz (49.2 kg)      Studies/Labs Reviewed:   EKG:  EKG is ordered today.  The ekg ordered today demonstrates normal sinus rhythm,, no acute change  Recent Labs: 01/27/2020: Magnesium 2.1 01/31/2020: ALT 24 02/02/2020: BUN 22; Creatinine, Ser 0.56; Hemoglobin 8.6; Platelets 264; Potassium 3.5; Sodium 139 06/12/2020: TSH 1.590   Lipid Panel    Component Value Date/Time   CHOL 135 01/31/2020 0656   TRIG 84 01/31/2020 0656   HDL 45 01/31/2020 0656   CHOLHDL 3.0 01/31/2020 0656   VLDL 17 01/31/2020 0656   LDLCALC 73 01/31/2020  [phone]    Additional studies/ records that were reviewed today include:  TEE 07/03/2020 IMPRESSIONS     1. Left ventricular ejection fraction, by estimation, is 60 to 65%. The  left ventricle has normal function. The left ventricle has no regional  wall motion abnormalities.   2. Possible echodensity in the mid esophageal views; when viewing biplane  and deep gastric images this is most consistent with prominent  trabeculations (morphologically normal). Right ventricular systolic  function is normal. The right ventricular size  is normal.   3. No left atrial/left atrial appendage thrombus was detected. The LAA  emptying velocity was 60 cm/s.   4. The mitral valve is grossly normal. No evidence of mitral valve  regurgitation. There is mild prolapse of of the mitral valve.   5. The aortic valve is tricuspid. Aortic valve regurgitation is trivial.   6. Ascending aortic height index 2.43 cm/m; increasing the  characterization of dilation from borderline to mild. There is mild  dilatation of the ascending aorta, measuring 39 mm. There is mild (Grade  II) plaque involving the descending aorta.   7. Evidence of atrial level shunting detected by color flow Doppler.  Agitated saline contrast bubble study was positive with shunting observed  within 3-6 cardiac cycles suggestive of interatrial  shunt. There is a  large patent foramen ovale with  predominantly left to right shunting across the atrial septum.   Comparison(s): Compared with 06/11/20- study confirms the shunt is from PFO.     Risk Assessment/Calculations:         ASSESSMENT:    1. PFO (patent foramen ovale)   2. Embolic stroke involving cerebral artery (HCC)   3. Essential hypertension   4. Mixed hyperlipidemia   5. Thoracic aortic aneurysm without rupture [facility](HCC)      PLAN:  In order of problems listed above:  PFO Dr. Excell Seltzerooper and Dr. Pearlean BrownieSethi reviewed her case and did not feel this needed to be closed  Embolic stroke involving cerebral artery 03/2020 she is recovering nicely.  Hypertension well-controlled  Hyperlipidemia on Pravachol  Thoracic aortic aneurysm followed by Dr. Maren BeachVantrigt  Shared Decision Making/Informed Consent        Medication Adjustments/Labs and Tests Ordered: Current medicines are reviewed at length with the patient today.  Concerns regarding medicines are outlined above.  Medication changes, Labs and Tests ordered today are listed in the Patient Instructions below. Patient Instructions  Medication Instructions:  Your physician recommends that you continue on your current medications as directed. Please refer to the Current Medication list given to you today.  *If you need a refill on your cardiac medications before your next appointment, please call your pharmacy*   Lab Work: none If you have labs (blood work) drawn today and your tests are completely normal, you will receive your results only by: MyChart Message (if you have MyChart) OR A paper copy in the mail If you have any lab test that is abnormal or we need to change your treatment, we will call you to review the results.   Follow-Up: At [facility], you and your health needs are our priority.  As part of our continuing mission to provide you with exceptional heart care, we have created designated Provider Care  Teams.  These Care Teams include your primary Cardiologist (physician) and Advanced Practice Providers (APPs -  Physician Assistants and Nurse Practitioners) who all work together to provide you with the care you need, when you need it.  We recommend signing  up for the patient portal called "MyChart".  Sign up information is provided on this After Visit Summary.  MyChart is used to connect with patients for Virtual Visits (Telemedicine).  Patients are able to view lab/test results, encounter notes, upcoming appointments, etc.  Non-urgent messages can be sent to your provider as well.   To learn more about what you can do with MyChart, go to [email or web address].    Your next appointment:   1 year(s)  The format for your next appointment:   In Person  Provider:   You may see Christell Constant, MD or one of the following Advanced Practice Providers on your designated Care Team:   Ronie Spies, PA-C Jacolyn Reedy, PA-C     Signed, Jacolyn Reedy, PA-C  10/15/2020 11:20 AM    [facility] [address] [place], [place], [place]  [address] Phone: [phone]; Fax: [phone]

## 2020-10-08 ENCOUNTER — Telehealth: Payer: Self-pay | Admitting: Pulmonary Disease

## 2020-10-08 NOTE — Telephone Encounter (Signed)
Nothing new to go over.  I will go over her culture results at her next visit We should have more of them back by then  Thanks  BLI    Josephine Igo, DO [facility] 10/08/2020 3:05 PM

## 2020-10-08 NOTE — Telephone Encounter (Signed)
Called and spoke with patient to let her know of message from Dr. Tonia Brooms. She expressed understanding. Confirmed upcoming appointment next week with her. Nothing further needed at this time.

## 2020-10-08 NOTE — Telephone Encounter (Signed)
Called and spoke with patient who states she is calling to go over Biopsy results. I see that you called her on 09/27/20 not sure if that is what she is referencing.   Please advise

## 2020-10-15 ENCOUNTER — Encounter: Payer: Self-pay | Admitting: Physician Assistant

## 2020-10-15 ENCOUNTER — Ambulatory Visit: Payer: Medicare Other | Admitting: Physician Assistant

## 2020-10-15 ENCOUNTER — Other Ambulatory Visit: Payer: Self-pay

## 2020-10-15 VITALS — BP 118/68 | HR 70 | Ht 63.0 in | Wt 107.8 lb

## 2020-10-15 DIAGNOSIS — I10 Essential (primary) hypertension: Secondary | ICD-10-CM | POA: Diagnosis not present

## 2020-10-15 DIAGNOSIS — Q2112 Patent foramen ovale: Secondary | ICD-10-CM

## 2020-10-15 DIAGNOSIS — Q211 Atrial septal defect: Secondary | ICD-10-CM | POA: Diagnosis not present

## 2020-10-15 DIAGNOSIS — E782 Mixed hyperlipidemia: Secondary | ICD-10-CM

## 2020-10-15 DIAGNOSIS — I712 Thoracic aortic aneurysm, without rupture, unspecified: Secondary | ICD-10-CM

## 2020-10-15 DIAGNOSIS — I6340 Cerebral infarction due to embolism of unspecified cerebral artery: Secondary | ICD-10-CM

## 2020-10-15 NOTE — Patient Instructions (Signed)
Medication Instructions:  Your physician recommends that you continue on your current medications as directed. Please refer to the Current Medication list given to you today.  *If you need a refill on your cardiac medications before your next appointment, please call your pharmacy*   Lab Work: none If you have labs (blood work) drawn today and your tests are completely normal, you will receive your results only by: MyChart Message (if you have MyChart) OR A paper copy in the mail If you have any lab test that is abnormal or we need to change your treatment, we will call you to review the results.   Follow-Up: At [facility], you and your health needs are our priority.  As part of our continuing mission to provide you with exceptional heart care, we have created designated Provider Care Teams.  These Care Teams include your primary Cardiologist (physician) and Advanced Practice Providers (APPs -  Physician Assistants and Nurse Practitioners) who all work together to provide you with the care you need, when you need it.  We recommend signing up for the patient portal called "MyChart".  Sign up information is provided on this After Visit Summary.  MyChart is used to connect with patients for Virtual Visits (Telemedicine).  Patients are able to view lab/test results, encounter notes, upcoming appointments, etc.  Non-urgent messages can be sent to your provider as well.   To learn more about what you can do with MyChart, go to [email or web address].    Your next appointment:   1 year(s)  The format for your next appointment:   In Person  Provider:   You may see Christell Constant, MD or one of the following Advanced Practice Providers on your designated Care Team:   Ronie Spies, PA-C Jacolyn Reedy, PA-C

## 2020-10-16 ENCOUNTER — Ambulatory Visit: Payer: Medicare Other | Admitting: Pulmonary Disease

## 2020-10-16 VITALS — BP 144/80 | HR 71 | Ht 63.0 in | Wt 112.0 lb

## 2020-10-16 DIAGNOSIS — R911 Solitary pulmonary nodule: Secondary | ICD-10-CM

## 2020-10-16 DIAGNOSIS — Z789 Other specified health status: Secondary | ICD-10-CM | POA: Diagnosis not present

## 2020-10-16 NOTE — Patient Instructions (Signed)
Thank you for visiting Dr. Tonia Brooms at [facility]. Today we recommend the following:  Orders Placed This Encounter  Procedures   CT Super D Chest Wo Contrast   Repeat CT chest in 1 year  Follow up with Dr. Cliffton Asters regarding incidental finding in right pelvis on PET Scan   Return in about 1 year (around 10/16/2021) for with APP or Dr. Tonia Brooms.    Please do your part to reduce the spread of COVID-19.

## 2020-10-16 NOTE — Progress Notes (Signed)
Synopsis: Referred in November 2022 for right lower lobe lung nodule by Laurann Montana, MD  Subjective:   PATIENT ID: Tracey Holmes GENDER: female DOB: 1933/07/19, MRN: [id number]  Chief Complaint  Patient presents with   Follow-up    Discuss biopsy results    This is a 87 year old female with a descending thoracic aneurysm, dyslipidemia, hypertension, history of stroke.  Patient was being evaluated by cardiothoracic surgery for aneurysm and on her follow-up CT imaging was found to have a right lower lobe pulmonary nodule.  This nodule is new in comparison to CT imaging that was completed in September 2020.  Her follow-up images in August 01, 2020 revealed a new 1.5 centimeter right lower lobe peripheral pulmonary nodule.  Patient was referred to [place] for evaluation and considerations for next steps.  Patient is a lifelong non-smoker.  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  She lives alone and able to take care of herself and manage her household.  She does have a neighbor with her today which helps bring her to her appointments as well as mowing her yard.  OV 10/16/2020: Here today for follow-up post bronchoscopy.Patient was taken for bronchoscopy on 09/20/2020.  Bronchoscopy results revealed cytology that was negative, hemosiderin laden macrophages.  No malignant cells identified.  Cultures were also negative.  She still has an acid-fast culture that is pending but nothing on smears or stains.  Patient has no respiratory complaints at this time.  PET scan that was completed prior to the navigational bronchoscopy did show an incidental within the right lower pelvis.  Small area of focal PET avid uptake could not exclude a lesion present.   Past Medical History:  Diagnosis Date   Aneurysm, descending thoracic aorta    Proximal (1.5 cm)   Arthritis    right knee   Cystic kidney disease    Dyslipidemia    History of kidney  stones    HTN (hypertension)    Personal history of kidney stones    Stroke [facility]) 924/21     No family history on file.   Past Surgical History:  Procedure Laterality Date   BRONCHIAL BIOPSY  09/20/2020   Procedure: BRONCHIAL BIOPSIES;  Surgeon: Josephine Igo, DO;  Location: [facility] ENDOSCOPY;  Service: Pulmonary;;   BRONCHIAL BRUSHINGS  09/20/2020   Procedure: BRONCHIAL BRUSHINGS;  Surgeon: Josephine Igo, DO;  Location: [facility]OS[facility];  Service: Pulmonary;;   BRONCHIAL NEEDLE ASPIRATION BIOPSY  09/20/2020   Procedure: BRONCHIAL NEEDLE ASPIRATION BIOPSIES;  Surgeon: Josephine Igo, DO;  Location: [facility];  Service: Pulmonary;;   BRONCHIAL WASHINGS  09/20/2020   Procedure: BRONCHIAL WASHINGS;  Surgeon: Josephine Igo, DO;  Location: [facility];  Service: Pulmonary;;   BUBBLE STUDY  07/03/2020   Procedure: BUBBLE STUDY;  Surgeon: Christell Constant, MD;  Location: [facility] ENDOS[facility];  Service: Cardiovascular;;   JOINT REPLACEMENT     TEE WITHOUT CARDIOVERSION N/A 07/03/2020   Procedure: TRANSESOPHAGEAL ECHOCARDIOGRAM (TEE);  Surgeon: Christell Constant, MD;  Location: [facility] ENDOSCOPY;  Service: Cardiovascular;  Laterality: N/A;   TONSILLECTOMY     age 44 per patient   TOTAL HIP ARTHROPLASTY Right 01/28/2020   Procedure: TOTAL HIP ARTHROPLASTY ANTERIOR APPROACH;  Surgeon: Samson Frederic, MD;  Location: [facility] OR;  Service: Orthopedics;  Laterality: Right;   VIDEO BRONCHOSCOPY WITH ENDOBRONCHIAL NAVIGATION Right 09/20/2020   Procedure: VIDEO BRONCHOSCOPY WITH ENDOBRONCHIAL NAVIGATION;  Surgeon: Tonia Brooms,  Rachel BoBradley L, DO;  Location: [facility] ENDOSCOPY;  Service: Pulmonary;  Laterality: Right;    Social History   Socioeconomic History   Marital status: Single    Spouse name: Not on file   Number of children: Not on file   Years of education: Not on file   Highest education level: Not on file  Occupational History   Not on file  Tobacco Use   Smoking status: Never   Smokeless tobacco: Never   Vaping Use   Vaping Use: Never used  Substance and Sexual Activity   Alcohol use: No   Drug use: No   Sexual activity: Not on file  Other Topics Concern   Not on file  Social History Narrative   Lives alone   Right Handed   Drinks 1 cup caffeine daily   Social Determinants of Health   Financial Resource Strain: Not on file  Food Insecurity: Not on file  Transportation Needs: Not on file  Physical Activity: Not on file  Stress: Not on file  Social Connections: Not on file  Intimate Partner Violence: Not on file     Allergies  Allergen Reactions   Benicar [Olmesartan] Rash   Diovan [Valsartan] Rash   Penicillins Rash     Outpatient Medications Prior to Visit  Medication Sig Dispense Refill   acetaminophen (TYLENOL) 500 MG tablet Take 500 mg by mouth 2 (two) times daily as needed (pain.).     amLODipine (NORVASC) 5 MG tablet Take 5 mg by mouth in the morning.     aspirin EC 81 MG tablet Take 81 mg by mouth in the morning.     brimonidine (ALPHAGAN) 0.2 % ophthalmic solution Place 1 drop into both eyes 3 (three) times daily.     Cholecalciferol (VITAMIN D) 2000 UNITS tablet Take 2,000 Units by mouth in the morning.     denosumab (PROLIA) 60 MG/ML SOLN injection Inject 60 mg into the skin every 6 (six) months. Administer in upper arm, thigh, or abdomen     dorzolamide-timolol (COSOPT) 22.3-6.8 MG/ML ophthalmic solution Place 1 drop into both eyes 2 (two) times daily.     lactose free nutrition (BOOST) LIQD Take 237 mLs by mouth every other day.     loratadine (CLARITIN) 10 MG tablet Take 10 mg by mouth at bedtime.     Multiple Vitamin (MULTIVITAMIN WITH MINERALS) TABS tablet Take 1 tablet by mouth in the morning. One-A-Day     Netarsudil-Latanoprost (ROCKLATAN) 0.02-0.005 % SOLN Place 1 drop into both eyes at bedtime.     niacin 250 MG tablet Take 250 mg by mouth in the morning.     Nutritional Supplements (CARNATION BREAKFAST ESSENTIALS) PACK Take 1 packet by mouth every  other day.     polyethylene glycol (MIRALAX / GLYCOLAX) 17 g packet Take 17 g by mouth daily as needed for mild constipation. 14 each 0   pravastatin (PRAVACHOL) 80 MG tablet Take 80 mg by mouth every evening.     No facility-administered medications prior to visit.    Review of Systems  Constitutional:  Negative for chills, fever, malaise/fatigue and weight loss.  HENT:  Negative for hearing loss, sore throat and tinnitus.   Eyes:  Negative for blurred vision and double vision.  Respiratory:  Negative for cough, hemoptysis, sputum production, shortness of breath, wheezing and stridor.   Cardiovascular:  Negative for chest pain, palpitations, orthopnea, leg swelling and PND.  Gastrointestinal:  Negative for abdominal pain, constipation, diarrhea, heartburn, nausea and vomiting.  Genitourinary:  Negative for dysuria, hematuria and urgency.  Musculoskeletal:  Negative for joint pain and myalgias.  Skin:  Negative for itching and rash.  Neurological:  Negative for dizziness, tingling, weakness and headaches.  Endo/Heme/Allergies:  Negative for environmental allergies. Does not bruise/bleed easily.  Psychiatric/Behavioral:  Negative for depression. The patient is not nervous/anxious and does not have insomnia.   All other systems reviewed and are negative.   Objective:  Physical Exam Vitals reviewed.  Constitutional:      General: She is not in acute distress.    Appearance: She is well-developed.  HENT:     Head: Normocephalic and atraumatic.  Eyes:     General: No scleral icterus.    Conjunctiva/sclera: Conjunctivae normal.     Pupils: Pupils are equal, round, and reactive to light.  Neck:     Vascular: No JVD.     Trachea: No tracheal deviation.  Cardiovascular:     Rate and Rhythm: Normal rate and regular rhythm.     Heart sounds: Normal heart sounds. No murmur heard. Pulmonary:     Effort: Pulmonary effort is normal. No tachypnea, accessory muscle usage or respiratory  distress.     Breath sounds: No stridor. No wheezing, rhonchi or rales.     Comments: Diminished breath sounds bilaterally Abdominal:     General: Bowel sounds are normal. There is no distension.     Palpations: Abdomen is soft.     Tenderness: There is no abdominal tenderness.  Musculoskeletal:        General: No tenderness.     Cervical back: Neck supple.     Comments: Moderate to severe kyphosis  Lymphadenopathy:     Cervical: No cervical adenopathy.  Skin:    General: Skin is warm and dry.     Capillary Refill: Capillary refill takes less than 2 seconds.     Findings: No rash.  Neurological:     Mental Status: She is alert and oriented to person, place, and time.  Psychiatric:        Behavior: Behavior normal.     Vitals:   10/16/20 1348  BP: (!) 144/80  Pulse: 71  SpO2: 97%  Weight: 112 lb (50.8 kg)  Height: 5\' 3"  (1.6 m)   97% on RA BMI Readings from Last 3 Encounters:  10/16/20 19.84 kg/m  10/15/20 19.10 kg/m  09/20/20 19.14 kg/m   Wt Readings from Last 3 Encounters:  10/16/20 112 lb (50.8 kg)  10/15/20 107 lb 12.8 oz (48.9 kg)  09/20/20 108 lb 0.4 oz (49 kg)    CBC    Component Value Date/Time   WBC 11.8 (H) 02/02/2020 0225   RBC 2.67 (L) 02/02/2020 0225   HGB 8.6 (L) 02/02/2020 0225   HCT 26.1 (L) 02/02/2020 0225   PLT 264 02/02/2020 0225   MCV 97.8 02/02/2020 0225   MCH 32.2 02/02/2020 0225   MCHC 33.0 02/02/2020 0225   RDW 13.0 02/02/2020 0225   LYMPHSABS 1.6 01/28/2020 0155   MONOABS 1.6 (H) 01/28/2020 0155   EOSABS 0.1 01/28/2020 0155   BASOSABS 0.0 01/28/2020 0155     Chest Imaging: March 2022: CT scan of the chest with 1.5 cm right lower lobe pulmonary nodule concerning for a primary bronchogenic carcinoma. The patient's images have been independently reviewed by me.    09/12/2020 CT chest and nuclear medicine PET scan: Minimal PET uptake within the nodule.  Persistent lower lobe nodule, indolent neoplasm not excluded. The  patient's images have been  independently reviewed by me.     Pulmonary Functions Testing Results: No flowsheet data found.  FeNO:   Pathology:   Echocardiogram:   Heart Catheterization:     Assessment & Plan:     ICD-10-CM   1. Lung nodule  R91.1 CT Super D Chest Wo Contrast    2. Non-smoker  Z78.9        Discussion:  This is an 87 year old female, referred from thoracic surgery with a right lower lobe 1.5 cm pulmonary nodule.  Not present on previous CT imaging in 2020.  She is lifelong non-smoker.  Recent nuclear medicine pet imaging with low-level uptake concerning for possible benign lesion.  However malignancy not excluded.  Nodule has been persistent.Patient is also hard of hearing.  Patient was taken for navigational bronchoscopy for tissue sampling.  All biopsies and cultures have been negative to date.  She did have an incidental finding within the right pelvis that ha PET uptake.  Plan: Reviewed lab results Reviewed path results Reviewed culture results Discussed pros and cons of continuing following nodule. Patient has opted for 1 year noncontrasted CT scan follow-up.  Patient is accompanied by friend who helps transport.    Recommend follow-up with primary care regarding pelvic/abdominal incidental. Can see me in 1 year after CT scan complete.    Current Outpatient Medications:    acetaminophen (TYLENOL) 500 MG tablet, Take 500 mg by mouth 2 (two) times daily as needed (pain.)., Disp: , Rfl:    amLODipine (NORVASC) 5 MG tablet, Take 5 mg by mouth in the morning., Disp: , Rfl:    aspirin EC 81 MG tablet, Take 81 mg by mouth in the morning., Disp: , Rfl:    brimonidine (ALPHAGAN) 0.2 % ophthalmic solution, Place 1 drop into both eyes 3 (three) times daily., Disp: , Rfl:    Cholecalciferol (VITAMIN D) 2000 UNITS tablet, Take 2,000 Units by mouth in the morning., Disp: , Rfl:    denosumab (PROLIA) 60 MG/ML SOLN injection, Inject 60 mg into the skin every 6  (six) months. Administer in upper arm, thigh, or abdomen, Disp: , Rfl:    dorzolamide-timolol (COSOPT) 22.3-6.8 MG/ML ophthalmic solution, Place 1 drop into both eyes 2 (two) times daily., Disp: , Rfl:    lactose free nutrition (BOOST) LIQD, Take 237 mLs by mouth every other day., Disp: , Rfl:    loratadine (CLARITIN) 10 MG tablet, Take 10 mg by mouth at bedtime., Disp: , Rfl:    Multiple Vitamin (MULTIVITAMIN WITH MINERALS) TABS tablet, Take 1 tablet by mouth in the morning. One-A-Day, Disp: , Rfl:    Netarsudil-Latanoprost (ROCKLATAN) 0.02-0.005 % SOLN, Place 1 drop into both eyes at bedtime., Disp: , Rfl:    niacin 250 MG tablet, Take 250 mg by mouth in the morning., Disp: , Rfl:    Nutritional Supplements (CARNATION BREAKFAST ESSENTIALS) PACK, Take 1 packet by mouth every other day., Disp: , Rfl:    polyethylene glycol (MIRALAX / GLYCOLAX) 17 g packet, Take 17 g by mouth daily as needed for mild constipation., Disp: 14 each, Rfl: 0   pravastatin (PRAVACHOL) 80 MG tablet, Take 80 mg by mouth every evening., Disp: , Rfl:     Josephine Igo, DO [facility] 10/16/2020 1:57 PM

## 2020-10-21 LAB — FUNGUS CULTURE WITH STAIN

## 2020-10-21 LAB — FUNGAL ORGANISM REFLEX

## 2020-10-21 LAB — FUNGUS CULTURE RESULT

## 2020-11-05 LAB — ACID FAST CULTURE WITH REFLEXED SENSITIVITIES (MYCOBACTERIA): Acid Fast Culture: NEGATIVE

## 2020-11-18 DIAGNOSIS — M25562 Pain in left knee: Secondary | ICD-10-CM | POA: Diagnosis not present

## 2020-11-18 DIAGNOSIS — M170 Bilateral primary osteoarthritis of knee: Secondary | ICD-10-CM | POA: Diagnosis not present

## 2020-11-18 DIAGNOSIS — M25561 Pain in right knee: Secondary | ICD-10-CM | POA: Diagnosis not present

## 2020-12-30 DIAGNOSIS — I10 Essential (primary) hypertension: Secondary | ICD-10-CM | POA: Diagnosis not present

## 2020-12-30 DIAGNOSIS — M810 Age-related osteoporosis without current pathological fracture: Secondary | ICD-10-CM | POA: Diagnosis not present

## 2020-12-30 DIAGNOSIS — R948 Abnormal results of function studies of other organs and systems: Secondary | ICD-10-CM | POA: Diagnosis not present

## 2020-12-30 DIAGNOSIS — Z0000 Encounter for general adult medical examination without abnormal findings: Secondary | ICD-10-CM | POA: Diagnosis not present

## 2020-12-30 DIAGNOSIS — I712 Thoracic aortic aneurysm, without rupture: Secondary | ICD-10-CM | POA: Diagnosis not present

## 2020-12-30 DIAGNOSIS — E785 Hyperlipidemia, unspecified: Secondary | ICD-10-CM | POA: Diagnosis not present

## 2020-12-30 DIAGNOSIS — Z23 Encounter for immunization: Secondary | ICD-10-CM | POA: Diagnosis not present

## 2020-12-30 DIAGNOSIS — E559 Vitamin D deficiency, unspecified: Secondary | ICD-10-CM | POA: Diagnosis not present

## 2020-12-30 DIAGNOSIS — E46 Unspecified protein-calorie malnutrition: Secondary | ICD-10-CM | POA: Diagnosis not present

## 2020-12-30 DIAGNOSIS — Z8673 Personal history of transient ischemic attack (TIA), and cerebral infarction without residual deficits: Secondary | ICD-10-CM | POA: Diagnosis not present

## 2020-12-31 ENCOUNTER — Other Ambulatory Visit: Payer: Self-pay | Admitting: Family Medicine

## 2020-12-31 DIAGNOSIS — R948 Abnormal results of function studies of other organs and systems: Secondary | ICD-10-CM

## 2021-01-22 ENCOUNTER — Ambulatory Visit
Admission: RE | Admit: 2021-01-22 | Discharge: 2021-01-22 | Disposition: A | Discharge status: Home / Self Care | Payer: Medicare Other | Source: Ambulatory Visit | Attending: Family Medicine | Admitting: Family Medicine

## 2021-01-22 ENCOUNTER — Other Ambulatory Visit: Payer: Self-pay

## 2021-01-22 DIAGNOSIS — R911 Solitary pulmonary nodule: Secondary | ICD-10-CM | POA: Diagnosis not present

## 2021-01-22 DIAGNOSIS — K449 Diaphragmatic hernia without obstruction or gangrene: Secondary | ICD-10-CM | POA: Diagnosis not present

## 2021-01-22 DIAGNOSIS — R935 Abnormal findings on diagnostic imaging of other abdominal regions, including retroperitoneum: Secondary | ICD-10-CM | POA: Diagnosis not present

## 2021-01-22 DIAGNOSIS — R948 Abnormal results of function studies of other organs and systems: Secondary | ICD-10-CM

## 2021-01-22 DIAGNOSIS — N281 Cyst of kidney, acquired: Secondary | ICD-10-CM | POA: Diagnosis not present

## 2021-01-22 MED ORDER — IOPAMIDOL (ISOVUE-300) INJECTION 61%
100.0000 mL | Freq: Once | INTRAVENOUS | Status: AC | PRN
  Administered 2021-01-22: 100 mL via INTRAVENOUS

## 2021-01-28 ENCOUNTER — Other Ambulatory Visit: Payer: Self-pay

## 2021-01-28 DIAGNOSIS — M7989 Other specified soft tissue disorders: Secondary | ICD-10-CM

## 2021-01-29 ENCOUNTER — Ambulatory Visit (HOSPITAL_COMMUNITY)
Admission: RE | Admit: 2021-01-29 | Discharge: 2021-01-29 | Disposition: A | Discharge status: Home / Self Care | Payer: Medicare Other | Source: Ambulatory Visit | Attending: Vascular Surgery | Admitting: Vascular Surgery

## 2021-01-29 ENCOUNTER — Other Ambulatory Visit: Payer: Self-pay

## 2021-01-29 DIAGNOSIS — M7989 Other specified soft tissue disorders: Secondary | ICD-10-CM | POA: Diagnosis not present

## 2021-01-30 ENCOUNTER — Ambulatory Visit: Payer: Medicare Other | Admitting: Vascular Surgery

## 2021-01-30 ENCOUNTER — Other Ambulatory Visit: Payer: Self-pay

## 2021-01-30 ENCOUNTER — Encounter: Payer: Self-pay | Admitting: Vascular Surgery

## 2021-01-30 VITALS — BP 130/84 | HR 70 | Temp 98.1°F | Resp 16 | Ht 63.0 in | Wt 110.0 lb

## 2021-01-30 DIAGNOSIS — I82411 Acute embolism and thrombosis of right femoral vein: Secondary | ICD-10-CM | POA: Diagnosis not present

## 2021-01-30 NOTE — Progress Notes (Signed)
ASSESSMENT & PLAN   RIGHT LOWER EXTREMITY DVT: This patient has a DVT involving the right common iliac vein and common femoral vein by CT.  This is confirmed by her venous duplex scan.  She was started on Eliquis after the results of her CT were known.  She remains on 5 mg p.o. twice daily.  We have discussed the importance of intermittent leg elevation and the proper positioning for this.  I have encouraged her to avoid prolonged sitting and standing.  We discussed importance of exercise.  She does have some compression stockings.  I have ordered a follow-up duplex scan in 3 months and I will see her back at that time.  I explained that normally we would consider anticoagulation for 3 months unless we felt she was very high risk for recurrence.  I will see her back in 3 months.  She knows to call sooner if she has problems.  REASON FOR CONSULT:    Right lower extremity DVT.  HPI:   Tracey Holmes is a 87 y.o. female who underwent a hip replacement on 01/26/2020.  She did well after that.  Recently she was examined and there was some concern that she had some swelling in the right leg.  This prompted a CT scan which showed evidence of clot in the right common iliac vein and right common femoral vein.  She was set up for vascular evaluation.  She had venous duplex scan yesterday which confirmed a clot in the right common femoral vein extending into the external iliac vein.  Of note she was started on Eliquis (5 mg p.o. twice daily) by her primary care physician and remains on this.  In reviewing the records from the referring office it sounds like she did have an abnormal PET scan which prompted the CT scan.  There was an abnormality noted in the pelvis.  She denies any previous history of DVT.  She did not notice really any significant right lower extremity swelling.  She denies significant pain in the right lower extremity.  She tells me that she is followed by cardiothoracic surgery with a  thoracic aneurysm.  Past Medical History:  Diagnosis Date   Aneurysm, descending thoracic aorta    Proximal (1.5 cm)   Arthritis    right knee   Cystic kidney disease    DVT (deep venous thrombosis) (HCC)    Dyslipidemia    History of kidney stones    HTN (hypertension)    Personal history of kidney stones    Stroke [facility]) 924/21    History reviewed. No pertinent family history.  SOCIAL HISTORY: Social History   Tobacco Use   Smoking status: Never   Smokeless tobacco: Never  Substance Use Topics   Alcohol use: No    Allergies  Allergen Reactions   Benicar [Olmesartan] Rash   Diovan [Valsartan] Rash   Penicillins Rash    Current Outpatient Medications  Medication Sig Dispense Refill   acetaminophen (TYLENOL) 500 MG tablet Take 500 mg by mouth 2 (two) times daily as needed (pain.).     amLODipine (NORVASC) 5 MG tablet Take 5 mg by mouth in the morning.     aspirin EC 81 MG tablet Take 81 mg by mouth in the morning.     brimonidine (ALPHAGAN) 0.2 % ophthalmic solution Place 1 drop into both eyes 3 (three) times daily.     Cholecalciferol (VITAMIN D) 2000 UNITS tablet Take 2,000 Units by mouth in the morning.  denosumab (PROLIA) 60 MG/ML SOLN injection Inject 60 mg into the skin every 6 (six) months. Administer in upper arm, thigh, or abdomen     dorzolamide-timolol (COSOPT) 22.3-6.8 MG/ML ophthalmic solution Place 1 drop into both eyes 2 (two) times daily.     ELIQUIS 2.5 MG TABS tablet Take by mouth.     lactose free nutrition (BOOST) LIQD Take 237 mLs by mouth every other day.     loratadine (CLARITIN) 10 MG tablet Take 10 mg by mouth at bedtime.     Multiple Vitamin (MULTIVITAMIN WITH MINERALS) TABS tablet Take 1 tablet by mouth in the morning. One-A-Day     Netarsudil-Latanoprost (ROCKLATAN) 0.02-0.005 % SOLN Place 1 drop into both eyes at bedtime.     niacin 250 MG tablet Take 250 mg by mouth in the morning.     Nutritional Supplements (CARNATION BREAKFAST  ESSENTIALS) PACK Take 1 packet by mouth every other day.     polyethylene glycol (MIRALAX / GLYCOLAX) 17 g packet Take 17 g by mouth daily as needed for mild constipation. 14 each 0   pravastatin (PRAVACHOL) 80 MG tablet Take 80 mg by mouth every evening.     No current facility-administered medications for this visit.    REVIEW OF SYSTEMS:  [X]  denotes positive finding, [ ]  denotes negative finding Cardiac  Comments:  Chest pain or chest pressure:    Shortness of breath upon exertion:    Short of breath when lying flat:    Irregular heart rhythm:        Vascular    Pain in calf, thigh, or hip brought on by ambulation:    Pain in feet at night that wakes you up from your sleep:     Blood clot in your veins:    Leg swelling:  x       Pulmonary    Oxygen at home:    Productive cough:     Wheezing:         Neurologic    Sudden weakness in arms or legs:     Sudden numbness in arms or legs:     Sudden onset of difficulty speaking or slurred speech:    Temporary loss of vision in one eye:     Problems with dizziness:         Gastrointestinal    Blood in stool:     Vomited blood:         Genitourinary    Burning when urinating:     Blood in urine:        Psychiatric    Major depression:         Hematologic    Bleeding problems:    Problems with blood clotting too easily:        Skin    Rashes or ulcers:        Constitutional    Fever or chills:    -  PHYSICAL EXAM:   Vitals:   01/30/21 1004  BP: 130/84  Pulse: 70  Resp: 16  Temp: 98.1 F (36.7 C)  SpO2: 97%  Weight: 110 lb (49.9 kg)  Height: 5\' 3"  (1.6 m)   Body mass index is 19.49 kg/m.  GENERAL: The patient is a well-nourished female, in no acute distress. The vital signs are documented above. CARDIAC: There is a regular rate and rhythm.  VASCULAR: I do not detect carotid bruits. On the right side she has a biphasic dorsalis pedis and posterior tibial signal. On the  left side I can palpate a  dorsalis pedis and posterior tibial signal. She has mild right lower extremity swelling. PULMONARY: There is good air exchange bilaterally without wheezing or rales. ABDOMEN: Soft and non-tender with normal pitched bowel sounds.  I do not palpate an aneurysm. MUSCULOSKELETAL: There are no major deformities. NEUROLOGIC: No focal weakness or paresthesias are detected. SKIN: There are no ulcers or rashes noted. PSYCHIATRIC: The patient has a normal affect.  DATA:    VENOUS DUPLEX: I have reviewed the venous duplex scan that was done yesterday.  This showed evidence of acute deep venous thrombosis involving the right common femoral vein extending into the right external iliac vein.  The right common iliac vein could not be visualized.  CT ABDOMEN PELVIS: I have reviewed the CT abdomen pelvis which shows evidence of clot involving the right common iliac vein and the right common femoral vein.  Waverly Ferrari [facility]

## 2021-01-31 ENCOUNTER — Other Ambulatory Visit: Payer: Self-pay

## 2021-01-31 DIAGNOSIS — I82411 Acute embolism and thrombosis of right femoral vein: Secondary | ICD-10-CM

## 2021-02-21 DIAGNOSIS — S72031D Displaced midcervical fracture of right femur, subsequent encounter for closed fracture with routine healing: Secondary | ICD-10-CM | POA: Diagnosis not present

## 2021-02-24 DIAGNOSIS — Z961 Presence of intraocular lens: Secondary | ICD-10-CM | POA: Diagnosis not present

## 2021-02-24 DIAGNOSIS — H401113 Primary open-angle glaucoma, right eye, severe stage: Secondary | ICD-10-CM | POA: Diagnosis not present

## 2021-02-24 DIAGNOSIS — H401122 Primary open-angle glaucoma, left eye, moderate stage: Secondary | ICD-10-CM | POA: Diagnosis not present

## 2021-02-24 DIAGNOSIS — H04123 Dry eye syndrome of bilateral lacrimal glands: Secondary | ICD-10-CM | POA: Diagnosis not present

## 2021-04-02 DIAGNOSIS — H40033 Anatomical narrow angle, bilateral: Secondary | ICD-10-CM | POA: Diagnosis not present

## 2021-04-02 DIAGNOSIS — H2513 Age-related nuclear cataract, bilateral: Secondary | ICD-10-CM | POA: Diagnosis not present

## 2021-05-01 ENCOUNTER — Ambulatory Visit (HOSPITAL_COMMUNITY): Payer: Medicare Other

## 2021-05-01 ENCOUNTER — Ambulatory Visit: Payer: Medicare Other | Admitting: Vascular Surgery

## 2021-05-22 ENCOUNTER — Ambulatory Visit: Payer: Medicare Other | Admitting: Vascular Surgery

## 2021-05-22 ENCOUNTER — Ambulatory Visit (HOSPITAL_COMMUNITY)
Admission: RE | Admit: 2021-05-22 | Discharge: 2021-05-22 | Disposition: A | Discharge status: Home / Self Care | Payer: Medicare Other | Source: Ambulatory Visit | Attending: Vascular Surgery | Admitting: Vascular Surgery

## 2021-05-22 ENCOUNTER — Other Ambulatory Visit: Payer: Self-pay

## 2021-05-22 ENCOUNTER — Encounter: Payer: Self-pay | Admitting: Vascular Surgery

## 2021-05-22 VITALS — BP 146/83 | HR 85 | Temp 98.7°F | Resp 20 | Ht 63.0 in | Wt 113.0 lb

## 2021-05-22 DIAGNOSIS — I82411 Acute embolism and thrombosis of right femoral vein: Secondary | ICD-10-CM

## 2021-05-22 DIAGNOSIS — Z86718 Personal history of other venous thrombosis and embolism: Secondary | ICD-10-CM | POA: Diagnosis not present

## 2021-05-22 DIAGNOSIS — Z09 Encounter for follow-up examination after completed treatment for conditions other than malignant neoplasm: Secondary | ICD-10-CM

## 2021-05-22 NOTE — Progress Notes (Signed)
REASON FOR VISIT:   Follow-up of right lower extremity DVT.  MEDICAL ISSUES:   RIGHT LOWER EXTREMITY DVT: Her venous duplex scan shows complete resolution of her right lower extremity DVT.  This was a provoked DVT and therefore I think the standard of care would be 3 to 6 months of anticoagulation.  I have instructed her to complete her Eliquis which would bring her to just over 5 months.  We also discussed the importance of exercise and leg elevation.  Given her age and risk of falling I would not recommend long-term anticoagulation unless she developed a recurrent DVT.  I will be happy to see her back at any time if she develops new vascular symptoms.  HPI:   Tracey Holmes is a pleasant 88 y.o. female who I last saw on 01/22/2021.  She underwent a hip replacement on 01/25/2021.  She subsequently developed some swelling in the right leg which prompted a CT scan which showed evidence of clot in the right common iliac vein and right common femoral vein.  She was set up for vascular evaluation we did a venous duplex scan on 01/29/2021 which confirmed clot in the right common femoral vein extending up into the external iliac vein.  Of note after her CT scan showed the clot she was started on Eliquis by her primary care physician.  The patient has now been on Eliquis for coming up on 4 months.  She just got a refill.  She is had minimal leg swelling.  She is ambulatory with her walker.  She has been elevating her legs some.  She does not complain of significant leg swelling.  She denies chest pain or shortness of breath.  Past Medical History:  Diagnosis Date   Aneurysm, descending thoracic aorta    Proximal (1.5 cm)   Arthritis    right knee   Cystic kidney disease    DVT (deep venous thrombosis) (HCC)    Dyslipidemia    History of kidney stones    HTN (hypertension)    Personal history of kidney stones    Stroke [facility](HCC) 924/21    History reviewed. No pertinent family  history.  SOCIAL HISTORY: Social History   Tobacco Use   Smoking status: Never   Smokeless tobacco: Never  Substance Use Topics   Alcohol use: No    Allergies  Allergen Reactions   Fosamax [Alendronate]     Other reaction(s): cramps   Benicar [Olmesartan] Rash   Diovan [Valsartan] Rash   Penicillins Rash    Current Outpatient Medications  Medication Sig Dispense Refill   acetaminophen (TYLENOL) 500 MG tablet Take 500 mg by mouth 2 (two) times daily as needed (pain.).     amLODipine (NORVASC) 5 MG tablet Take 5 mg by mouth in the morning.     aspirin EC 81 MG tablet Take 81 mg by mouth in the morning.     brimonidine (ALPHAGAN) 0.2 % ophthalmic solution Place 1 drop into both eyes 3 (three) times daily.     Cholecalciferol (VITAMIN D) 2000 UNITS tablet Take 2,000 Units by mouth in the morning.     denosumab (PROLIA) 60 MG/ML SOLN injection Inject 60 mg into the skin every 6 (six) months. Administer in upper arm, thigh, or abdomen     dorzolamide-timolol (COSOPT) 22.3-6.8 MG/ML ophthalmic solution Place 1 drop into both eyes 2 (two) times daily.     ELIQUIS 2.5 MG TABS tablet Take by mouth.     lactose  free nutrition (BOOST) LIQD Take 237 mLs by mouth every other day.     loratadine (CLARITIN) 10 MG tablet Take 10 mg by mouth at bedtime.     Multiple Vitamin (MULTIVITAMIN WITH MINERALS) TABS tablet Take 1 tablet by mouth in the morning. One-A-Day     Netarsudil-Latanoprost (ROCKLATAN) 0.02-0.005 % SOLN Place 1 drop into both eyes at bedtime.     niacin 250 MG tablet Take 250 mg by mouth in the morning.     Nutritional Supplements (CARNATION BREAKFAST ESSENTIALS) PACK Take 1 packet by mouth every other day.     polyethylene glycol (MIRALAX / GLYCOLAX) 17 g packet Take 17 g by mouth daily as needed for mild constipation. 14 each 0   pravastatin (PRAVACHOL) 80 MG tablet Take 80 mg by mouth every evening.     No current facility-administered medications for this visit.    REVIEW  OF SYSTEMS:  [X]  denotes positive finding, [ ]  denotes negative finding Cardiac  Comments:  Chest pain or chest pressure:    Shortness of breath upon exertion:    Short of breath when lying flat:    Irregular heart rhythm:        Vascular    Pain in calf, thigh, or hip brought on by ambulation:    Pain in feet at night that wakes you up from your sleep:     Blood clot in your veins:    Leg swelling:         Pulmonary    Oxygen at home:    Productive cough:     Wheezing:         Neurologic    Sudden weakness in arms or legs:     Sudden numbness in arms or legs:     Sudden onset of difficulty speaking or slurred speech:    Temporary loss of vision in one eye:     Problems with dizziness:         Gastrointestinal    Blood in stool:     Vomited blood:         Genitourinary    Burning when urinating:     Blood in urine:        Psychiatric    Major depression:         Hematologic    Bleeding problems:    Problems with blood clotting too easily:        Skin    Rashes or ulcers:        Constitutional    Fever or chills:     PHYSICAL EXAM:   Vitals:   05/22/21 1551  BP: (!) 146/83  Pulse: 85  Resp: 20  Temp: 98.7 F (37.1 C)  SpO2: 96%  Weight: 113 lb (51.3 kg)  Height: 5\' 3"  (1.6 m)    GENERAL: The patient is a well-nourished female, in no acute distress. The vital signs are documented above. CARDIAC: There is a regular rate and rhythm.  VASCULAR: She has no significant right lower extremity swelling. Both feet are warm and well-perfused. PULMONARY: There is good air exchange bilaterally without wheezing or rales. ABDOMEN: Soft and non-tender with normal pitched bowel sounds.  MUSCULOSKELETAL: There are no major deformities or cyanosis. NEUROLOGIC: No focal weakness or paresthesias are detected. SKIN: There are no ulcers or rashes noted. PSYCHIATRIC: The patient has a normal affect.  DATA:    VENOUS DUPLEX: I have independently interpreted her venous  duplex scan today.  This shows no evidence  of DVT in the right lower extremity from the common femoral vein down to the calf.  The technologist was able to visualize up into the iliac system on the right and there was no evidence of DVT.  Thus I think all of her clot has resolved.  Waverly Ferrari [facility] [phone]

## 2021-07-10 DIAGNOSIS — I10 Essential (primary) hypertension: Secondary | ICD-10-CM | POA: Diagnosis not present

## 2021-07-10 DIAGNOSIS — Z86718 Personal history of other venous thrombosis and embolism: Secondary | ICD-10-CM | POA: Diagnosis not present

## 2021-07-10 DIAGNOSIS — M810 Age-related osteoporosis without current pathological fracture: Secondary | ICD-10-CM | POA: Diagnosis not present

## 2021-07-10 DIAGNOSIS — E785 Hyperlipidemia, unspecified: Secondary | ICD-10-CM | POA: Diagnosis not present

## 2021-08-27 DIAGNOSIS — H401122 Primary open-angle glaucoma, left eye, moderate stage: Secondary | ICD-10-CM | POA: Diagnosis not present

## 2021-08-27 DIAGNOSIS — H401113 Primary open-angle glaucoma, right eye, severe stage: Secondary | ICD-10-CM | POA: Diagnosis not present

## 2021-08-27 DIAGNOSIS — H04123 Dry eye syndrome of bilateral lacrimal glands: Secondary | ICD-10-CM | POA: Diagnosis not present

## 2021-08-27 DIAGNOSIS — Z961 Presence of intraocular lens: Secondary | ICD-10-CM | POA: Diagnosis not present

## 2021-09-02 DIAGNOSIS — R42 Dizziness and giddiness: Secondary | ICD-10-CM | POA: Diagnosis not present

## 2021-09-02 DIAGNOSIS — R8290 Unspecified abnormal findings in urine: Secondary | ICD-10-CM | POA: Diagnosis not present

## 2021-10-14 ENCOUNTER — Other Ambulatory Visit: Payer: Medicare Other

## 2021-10-15 ENCOUNTER — Ambulatory Visit
Admission: RE | Admit: 2021-10-15 | Discharge: 2021-10-15 | Disposition: A | Discharge status: Home / Self Care | Payer: Medicare Other | Source: Ambulatory Visit | Attending: Pulmonary Disease | Admitting: Pulmonary Disease

## 2021-10-15 DIAGNOSIS — R911 Solitary pulmonary nodule: Secondary | ICD-10-CM

## 2021-10-15 DIAGNOSIS — I700 Atherosclerosis of aorta: Secondary | ICD-10-CM | POA: Diagnosis not present

## 2021-10-29 ENCOUNTER — Encounter: Payer: Self-pay | Admitting: Nurse Practitioner

## 2021-10-29 ENCOUNTER — Ambulatory Visit: Payer: Medicare Other | Admitting: Nurse Practitioner

## 2021-10-29 DIAGNOSIS — R911 Solitary pulmonary nodule: Secondary | ICD-10-CM | POA: Diagnosis not present

## 2021-10-29 DIAGNOSIS — I7121 Aneurysm of the ascending aorta, without rupture: Secondary | ICD-10-CM

## 2021-10-29 NOTE — Assessment & Plan Note (Addendum)
Bronchoscopy from 09/2020 with negative cytology and cultures. Surveillance CT from earlier this month showed that nodule has decreased from 1.5x1 cm to 1.4x0.3 cm and is far less bulky, consistent with scarring from previous infection/inflammation. Repeat imaging is not warranted at this time as she is low risk and never smoker.   Patient Instructions  Follow up with Dr. Cliffton Asters to discuss your enlarged thoracic aorta and determine if she would like to monitor with annual scans or refer you back to cardiothoracic surgery.  Your previous lung nodule is smaller and appears to be more consistent with scarring now.   Follow up with Dr. Tonia Brooms as needed. If symptoms do not improve or worsen, please contact office for sooner follow up or seek emergency care.

## 2021-10-29 NOTE — Patient Instructions (Signed)
Follow up with Dr. Cliffton Asters to discuss your enlarged thoracic aorta and determine if she would like to monitor with annual scans or refer you back to cardiothoracic surgery.  Your previous lung nodule is smaller and appears to be more consistent with scarring now.   Follow up with Dr. Tonia Brooms as needed. If symptoms do not improve or worsen, please contact office for sooner follow up or seek emergency care.

## 2021-10-29 NOTE — Assessment & Plan Note (Addendum)
Thoracic aorta slightly increased from 3.8cm to 4.1 cm now. Recommended she continue annual imaging for surveillance. She had been told by CT surgery she didn't need to return so recommended she discuss monitoring with Dr. Cliffton Asters and determine next steps.

## 2021-10-29 NOTE — Progress Notes (Signed)
@Patient  ID: , female    DOB: [birth year], 88 y.o.   MRN: [id number]  Chief Complaint  Patient presents with   Follow-up    She is here to go over her CT scan. She is doing well with her breathing.     Referring provider: [id number], MD  HPI: 88 year old female, never smoker followed for lung nodule.  She is a patient of Dr. 88 and last seen in office 10/16/2020.  She was initially referred to the pulmonary office after being evaluated by CT surgery for aneurysm and on follow-up CT imaging was found to have a right lower lobe pulmonary nodule.  She underwent bronchoscopy for further evaluation in May 2022 with negative cytology and cultures, hemosiderin laden macrophages. Past medical history significant for HTN, thoracic aortic aneurysm, hx of CVA, PFO, cystic kidney disease, HLD.   TEST/EVENTS:  09/12/2020 PET scan: There is very limited FDG uptake in the right lower lobe area.  No signs of metastatic disease.  There is mild uptake around the right hip, which upon review looks like it was related to loops of bowel on previous CT scans.  Recommended short-term follow-up with CT of the pelvis in 3 months. 09/12/2020 super D chest: Ascending thoracic aortic dilation approximately 4 cm.  CAD and atherosclerosis present.  No LAD present.  There is small hiatal hernia.  There is pulmonary emphysema.  There is biapical pleural and parenchymal scarring.  There is an irregular nodule in the right lung base measuring 1.5 x 1 cm which abuts the pleural surface.   10/15/2021 super D chest: Atherosclerosis/CAD.  There is ectasia of the ascending thoracic aorta increased from 3.8 cm to 4.1 cm.  No LAD.  Small hiatal hernia.  There has been interval regression of the previously noted nodule in the periphery of the right lower lobe, now appears elongated area of apparent scarring measuring 1.4 x 0.3 cm and far less bulky than prior.  No other suspicious pulmonary nodules or masses  noted.  10/16/2020: OV with Dr. 10/18/2020 for follow-up post bronchoscopy, which revealed cytology negative for malignant cells.  Cultures were also negative.  She is a lifelong non-smoker.  There was low-level uptake concerning for possible benign lesion; discussed pros and cons of continuing following nodule.  She opted for 1 year noncontrasted CT scan follow-up.  She has plans to follow-up with her PCP to evaluate incidental finding in the right pelvis that had PET uptake.  10/29/2021: Today-follow-up Patient presents today for follow-up after undergoing surveillance CT of the chest.  The area of concern in her right lung has regressed and now measures 1.4 x 0.3 cm (previously 1.5 x 1 cm); it is far less bulky and appears to be more consistent with scarring and related to previous inflammatory or infectious process.  It did reveal that her thoracic aorta now measured 4.1 cm versus 3.8 cm on previous imaging.  She has not followed up with CT surgery and was told to only follow-up with them as needed.  Today, she reports feeling well.  She has no respiratory concerns or complaints currently.  Allergies  Allergen Reactions   Benicar [Olmesartan] Rash   Diovan [Valsartan] Rash   Fosamax [Alendronate] Other (See Comments)    Other reaction(s): cramps   Penicillins Rash    Immunization History  Administered Date(s) Administered   Influenza Split 06/15/2011, 02/10/2012, 02/24/2013, 02/07/2014, 03/12/2015, 12/17/2015, 02/17/2017, 03/01/2018   Influenza Whole 12/30/2015, 02/01/2017, 02/20/2018   Influenza, High Dose Seasonal  PF 02/07/2017   PFIZER(Purple Top)SARS-COV-2 Vaccination 06/18/2019, 07/11/2019, 03/29/2020   Pneumococcal Conjugate-13 11/06/2013   Pneumococcal Polysaccharide-23 12/21/2005   Td 07/10/2015   Tdap 12/28/2005   Zoster, Live 11/20/2009, 12/27/2019, 06/28/2020    Past Medical History:  Diagnosis Date   Aneurysm, descending thoracic aorta    Proximal (1.5 cm)   Arthritis     right knee   Cystic kidney disease    DVT (deep venous thrombosis) (HCC)    Dyslipidemia    History of kidney stones    HTN (hypertension)    Personal history of kidney stones    Stroke (HCC) 924/21    Tobacco History: Social History   Tobacco Use  Smoking Status Never  Smokeless Tobacco Never   Counseling given: Not Answered   Outpatient Medications Prior to Visit  Medication Sig Dispense Refill   acetaminophen (TYLENOL) 500 MG tablet Take 500 mg by mouth 2 (two) times daily as needed (pain.).     amLODipine (NORVASC) 5 MG tablet Take 5 mg by mouth in the morning.     aspirin EC 81 MG tablet Take 81 mg by mouth in the morning.     brimonidine (ALPHAGAN) 0.2 % ophthalmic solution Place 1 drop into both eyes 3 (three) times daily.     Cholecalciferol (VITAMIN D) 2000 UNITS tablet Take 2,000 Units by mouth in the morning.     denosumab (PROLIA) 60 MG/ML SOLN injection Inject 60 mg into the skin every 6 (six) months. Administer in upper arm, thigh, or abdomen     dorzolamide-timolol (COSOPT) 22.3-6.8 MG/ML ophthalmic solution Place 1 drop into both eyes 2 (two) times daily.     lactose free nutrition (BOOST) LIQD Take 237 mLs by mouth every other day.     loratadine (CLARITIN) 10 MG tablet Take 10 mg by mouth at bedtime.     Multiple Vitamin (MULTIVITAMIN WITH MINERALS) TABS tablet Take 1 tablet by mouth in the morning. One-A-Day     Netarsudil-Latanoprost (ROCKLATAN) 0.02-0.005 % SOLN Place 1 drop into both eyes at bedtime.     niacin 250 MG tablet Take 250 mg by mouth in the morning.     Nutritional Supplements (CARNATION BREAKFAST ESSENTIALS) PACK Take 1 packet by mouth every other day.     polyethylene glycol (MIRALAX / GLYCOLAX) 17 g packet Take 17 g by mouth daily as needed for mild constipation. 14 each 0   pravastatin (PRAVACHOL) 80 MG tablet Take 80 mg by mouth every evening.     ELIQUIS 2.5 MG TABS tablet Take by mouth. (Patient not taking: Reported on 10/29/2021)     No  facility-administered medications prior to visit.     Review of Systems:   Constitutional: No weight loss or gain, night sweats, fevers, chills, fatigue, or lassitude. HEENT: No headaches, difficulty swallowing, tooth/dental problems, or sore throat. No sneezing, itching, ear ache, nasal congestion, or post nasal drip CV:  No chest pain, orthopnea, PND, swelling in lower extremities, anasarca, dizziness, palpitations, syncope Resp: No shortness of breath with exertion or at rest. No excess mucus or change in color of mucus. No productive or non-productive. No hemoptysis. No wheezing.  No chest wall deformity Skin: No rash, lesions, ulcerations MSK:  No joint pain or swelling.  No decreased range of motion.  No back pain. Neuro: No dizziness or lightheadedness.  Psych: No depression or anxiety. Mood stable.     Physical Exam:  BP 110/68 (BP Location: Left Arm, Cuff Size: Normal)   Pulse  72   Temp 98 F (36.7 C) (Oral)   Ht 5\' 3"  (1.6 m)   Wt 111 lb (50.3 kg)   SpO2 95%   BMI 19.66 kg/m   GEN: Pleasant, interactive, elderly, frail; in no acute distress. HEENT:  Normocephalic and atraumatic. PERRLA. Sclera white. Nasal turbinates pink, moist and patent bilaterally. No rhinorrhea present. Oropharynx pink and moist, without exudate or edema. No lesions, ulcerations, or postnasal drip.  NECK:  Supple w/ fair ROM. No JVD present.  CV: RRR, no m/r/g, no peripheral edema. Pulses intact, +2 bilaterally. No cyanosis, pallor or clubbing. PULMONARY:  Unlabored, regular breathing. Clear bilaterally A&P w/o wheezes/rales/rhonchi. No accessory muscle use. No dullness to percussion. GI: BS present and normoactive. Soft, non-tender to palpation.  MSK: No erythema, warmth or tenderness. Cap refil <2 sec all extrem. No deformities or joint swelling noted.  Neuro: A/Ox3. No focal deficits noted.   Skin: Warm, no lesions or rashe Psych: Normal affect and behavior. Judgement and thought content  appropriate.     Lab Results:  CBC    Component Value Date/Time   WBC 11.8 (H) 02/02/2020 0225   RBC 2.67 (L) 02/02/2020 0225   HGB 8.6 (L) 02/02/2020 0225   HCT 26.1 (L) 02/02/2020 0225   PLT 264 02/02/2020 0225   MCV 97.8 02/02/2020 0225   MCH 32.2 02/02/2020 0225   MCHC 33.0 02/02/2020 0225   RDW 13.0 02/02/2020 0225   LYMPHSABS 1.6 01/28/2020 0155   MONOABS 1.6 (H) 01/28/2020 0155   EOSABS 0.1 01/28/2020 0155   BASOSABS 0.0 01/28/2020 0155    BMET    Component Value Date/Time   NA 139 02/02/2020 0225   K 3.5 02/02/2020 0225   CL 107 02/02/2020 0225   CO2 21 (L) 02/02/2020 0225   GLUCOSE 101 (H) 02/02/2020 0225   BUN 22 02/02/2020 0225   CREATININE 0.56 02/02/2020 0225   CREATININE 0.60 01/29/2014 1442   CALCIUM 7.8 (L) 02/02/2020 0225   GFRNONAA >60 02/02/2020 0225   GFRAA >60 02/02/2020 0225    BNP No results found for: "BNP"   Imaging:  CT Super D Chest Wo Contrast  Result Date: 10/17/2021 CLINICAL DATA:  88 year old female history of pulmonary nodule. Followup study. EXAM: CT CHEST WITHOUT CONTRAST TECHNIQUE: Multidetector CT imaging of the chest was performed using thin slice collimation for electromagnetic bronchoscopy planning purposes, without intravenous contrast. RADIATION DOSE REDUCTION: This exam was performed according to the departmental dose-optimization program which includes automated exposure control, adjustment of the mA and/or kV according to patient size and/or use of iterative reconstruction technique. COMPARISON:  Chest CT 09/12/2020. FINDINGS: Cardiovascular: Heart size is normal. There is no significant pericardial fluid, thickening or pericardial calcification. There is aortic atherosclerosis, as well as atherosclerosis of the great vessels of the mediastinum and the coronary arteries, including calcified atherosclerotic plaque in the left anterior descending and right coronary arteries. Ectasia of ascending thoracic aorta (4.1 cm in  diameter), increased from 3.8 cm in diameter on the prior study. Mediastinum/Nodes: No pathologically enlarged mediastinal or hilar lymph nodes. Small hiatal hernia. No axillary lymphadenopathy. Lungs/Pleura: There has been interval regression of the previously noted nodule in the periphery of the right lower lobe, which currently appears as an elongated area of apparent scarring measuring 1.4 x 0.3 cm (axial image 114 of series 3), far less bulky than the prior examination. No other new suspicious appearing pulmonary nodules or masses are noted. No acute consolidative airspace disease. No pleural effusions. Upper Abdomen:  Aortic atherosclerosis. Musculoskeletal: Expansile sclerotic area in the posterior aspect of the left fifth rib, similar to numerous prior examinations, favored to represent a benign process such as Paget's disease. There are no other aggressive appearing lytic or blastic lesions noted in the visualized portions of the skeleton. IMPRESSION: 1. Interval regression of a lesion in the periphery of the right lower lobe which was presumably of infectious or inflammatory etiology on prior examinations, as there is a linear area of apparent scarring in its wake on today's examination. No new suspicious appearing pulmonary nodules or masses are noted. 2. Aortic atherosclerosis, in addition to two-vessel coronary artery disease. There is also ectasia of the ascending thoracic aorta (4.1 cm in diameter), increased from 3.8 cm in diameter on the prior examination. Recommend annual imaging followup by CTA or MRA. This recommendation follows 2010 ACCF/AHA/AATS/ACR/ASA/SCA/SCAI/SIR/STS/SVM Guidelines for the Diagnosis and Management of Patients with Thoracic Aortic Disease. Circulation. 2010; 121: Z025-E527. Aortic aneurysm NOS (ICD10-I71.9) 3. Additional incidental findings, as above. Aortic Atherosclerosis (ICD10-I70.0). Electronically Signed   By: Trudie Reed M.D.   On: 10/17/2021 08:20          No  data to display          No results found for: "NITRICOXIDE"      Assessment & Plan:   Lung nodule Bronchoscopy from 09/2020 with negative cytology and cultures. Surveillance CT from earlier this month showed that nodule has decreased from 1.5x1 cm to 1.4x0.3 cm and is far less bulky, consistent with scarring from previous infection/inflammation. Repeat imaging is not warranted at this time as she is low risk and never smoker.   Patient Instructions  Follow up with Dr. Cliffton Asters to discuss your enlarged thoracic aorta and determine if she would like to monitor with annual scans or refer you back to cardiothoracic surgery.  Your previous lung nodule is smaller and appears to be more consistent with scarring now.   Follow up with Dr. Tonia Brooms as needed. If symptoms do not improve or worsen, please contact office for sooner follow up or seek emergency care.     Thoracic aortic aneurysm without rupture (HCC) Thoracic aorta slightly increased from 3.8cm to 4.1 cm now. Recommended she continue annual imaging for surveillance. She had been told by CT surgery she didn't need to return so recommended she discuss monitoring with Dr. Cliffton Asters and determine next steps.    I spent 28 minutes of dedicated to the care of this patient on the date of this encounter to include pre-visit review of records, face-to-face time with the patient discussing conditions above, post visit ordering of testing, clinical documentation with the electronic health record, making appropriate referrals as documented, and communicating necessary findings to members of the patients care team.  Noemi Chapel, NP 10/29/2021  Pt aware and understands NP's role.

## 2021-11-02 NOTE — Progress Notes (Signed)
[name],, please let patient know that her ct chest was reviewed and the nodules we have been following have resolved. She will need a repeat cta chest in one year to follow up ectasia of the thoracic aorta. This can be done by her PCP or followed in the vascular surgery clinic. If she would like a referral to vascular surgery please place referral.    Thanks,  BLI  Tracey Igo, DO [facility] 11/02/2021 1:12 PM

## 2022-01-06 DIAGNOSIS — Z0000 Encounter for general adult medical examination without abnormal findings: Secondary | ICD-10-CM | POA: Diagnosis not present

## 2022-01-06 DIAGNOSIS — E46 Unspecified protein-calorie malnutrition: Secondary | ICD-10-CM | POA: Diagnosis not present

## 2022-01-06 DIAGNOSIS — E559 Vitamin D deficiency, unspecified: Secondary | ICD-10-CM | POA: Diagnosis not present

## 2022-01-06 DIAGNOSIS — E785 Hyperlipidemia, unspecified: Secondary | ICD-10-CM | POA: Diagnosis not present

## 2022-01-06 DIAGNOSIS — B372 Candidiasis of skin and nail: Secondary | ICD-10-CM | POA: Diagnosis not present

## 2022-01-06 DIAGNOSIS — R7303 Prediabetes: Secondary | ICD-10-CM | POA: Diagnosis not present

## 2022-01-06 DIAGNOSIS — I10 Essential (primary) hypertension: Secondary | ICD-10-CM | POA: Diagnosis not present

## 2022-01-06 DIAGNOSIS — Q2112 Patent foramen ovale: Secondary | ICD-10-CM | POA: Diagnosis not present

## 2022-01-06 DIAGNOSIS — M810 Age-related osteoporosis without current pathological fracture: Secondary | ICD-10-CM | POA: Diagnosis not present

## 2022-01-06 DIAGNOSIS — Z8673 Personal history of transient ischemic attack (TIA), and cerebral infarction without residual deficits: Secondary | ICD-10-CM | POA: Diagnosis not present

## 2022-01-21 DIAGNOSIS — M810 Age-related osteoporosis without current pathological fracture: Secondary | ICD-10-CM | POA: Diagnosis not present

## 2022-03-02 ENCOUNTER — Telehealth: Payer: Self-pay | Admitting: Internal Medicine

## 2022-03-02 NOTE — Telephone Encounter (Signed)
Called pt today from referral workqueue, pt would like to see if she can do a tele visit f/u instead of in office.

## 2022-03-11 DIAGNOSIS — H401122 Primary open-angle glaucoma, left eye, moderate stage: Secondary | ICD-10-CM | POA: Diagnosis not present

## 2022-03-11 DIAGNOSIS — H401113 Primary open-angle glaucoma, right eye, severe stage: Secondary | ICD-10-CM | POA: Diagnosis not present

## 2022-03-11 DIAGNOSIS — Z961 Presence of intraocular lens: Secondary | ICD-10-CM | POA: Diagnosis not present

## 2022-06-04 ENCOUNTER — Ambulatory Visit: Payer: Medicare Other | Admitting: Internal Medicine

## 2022-06-11 IMAGING — CT CT CHEST SUPER D W/O CM
2 of 4 series · 15 of 36 positions shown, 18 images · non-contrast
Comparison: August 01, 2020.

CLINICAL DATA: Lung nodule follow-up.

EXAM:
CT CHEST WITHOUT CONTRAST
TECHNIQUE: Multidetector CT imaging of the chest was performed using thin slice
collimation for electromagnetic bronchoscopy planning purposes,
without intravenous contrast.

[Series 4: coronal · coronal · 0.59mm/px · 3 of 128 slices shown]
[im 26/128  lung]
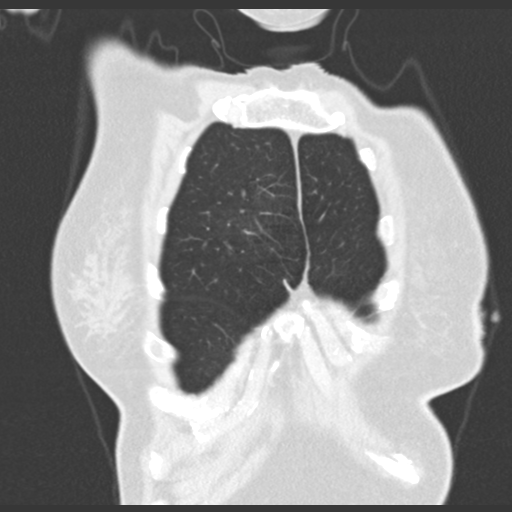
[im 51/128  lung]
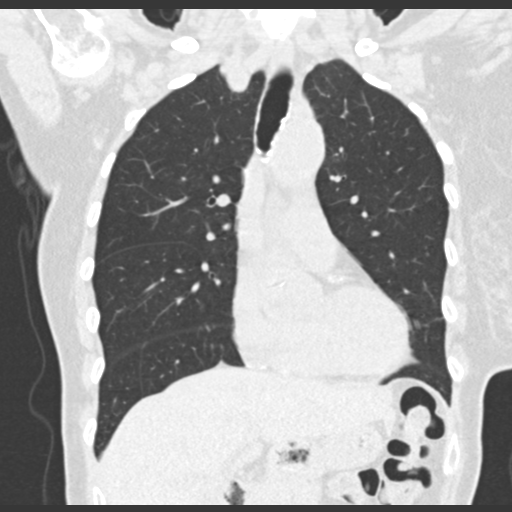
[im 77/128  lung]
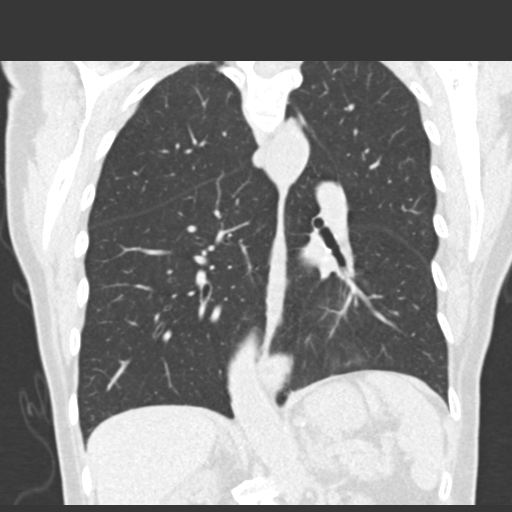

[Series 7: lungs · axial · 0.61mm/px · z∈[-384,-126]mm · 12 of 145 slices shown, 15 images]
[im 8/145  mediastinal]
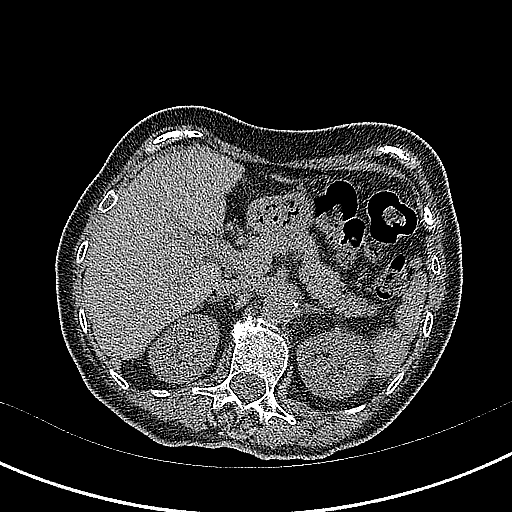
[im 8/145  lung]
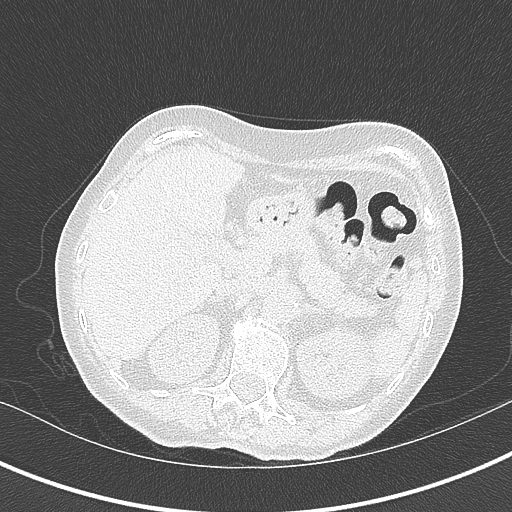
[im 23/145  lung]
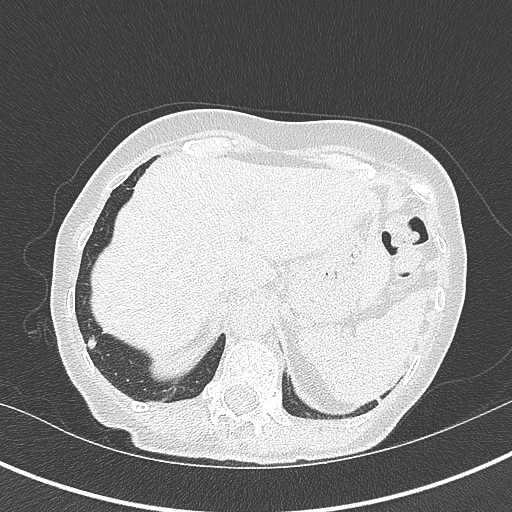
[im 31/145  lung]
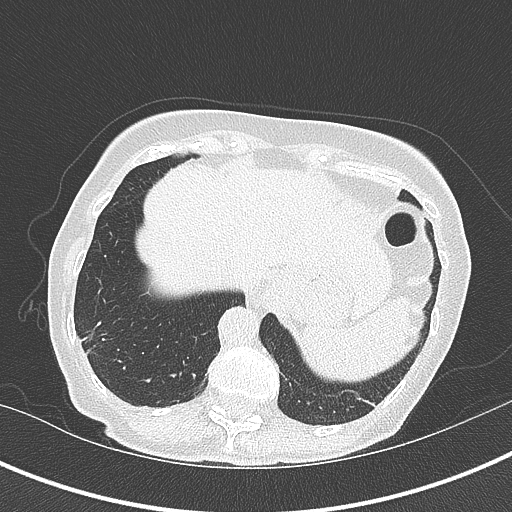
[im 46/145  lung]
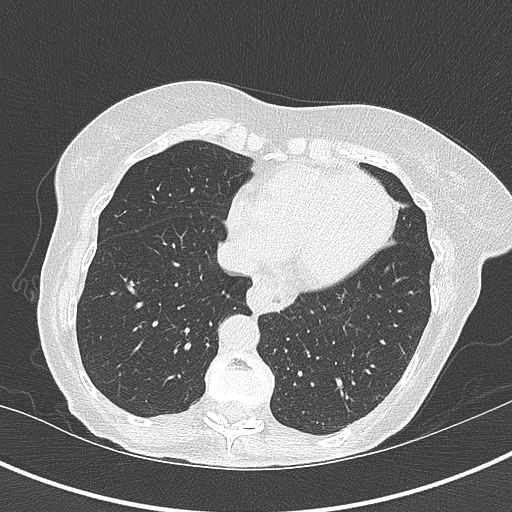
[im 54/145  mediastinal]
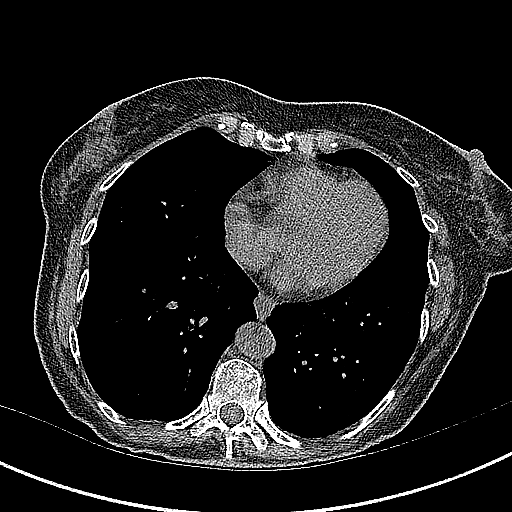
[im 54/145  lung]
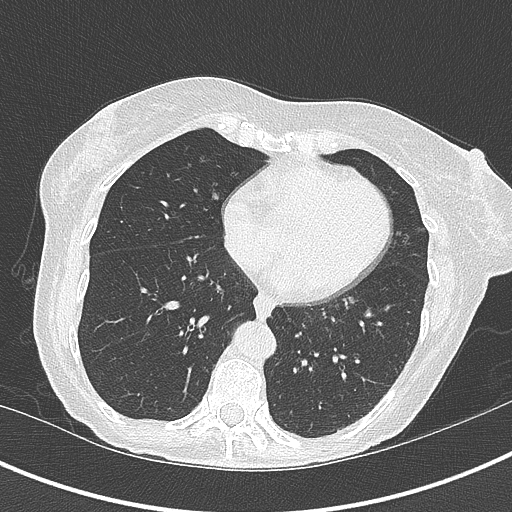
[im 69/145  lung]
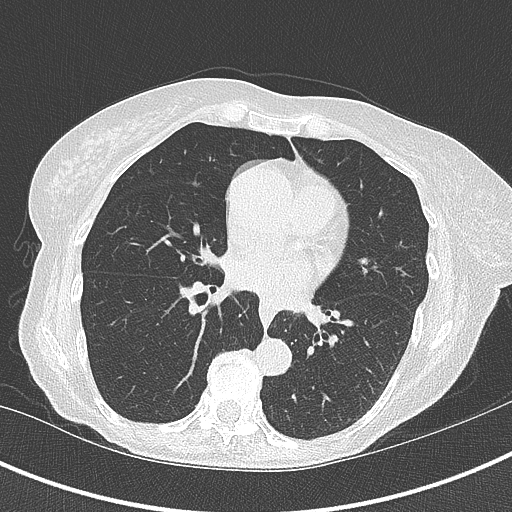
[im 76/145  lung]
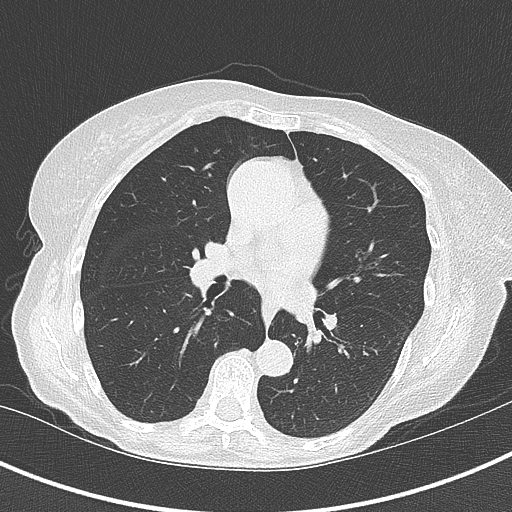
[im 91/145  lung]
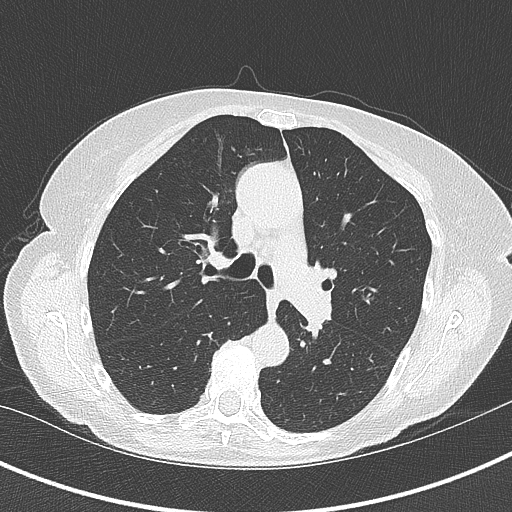
[im 99/145  mediastinal]
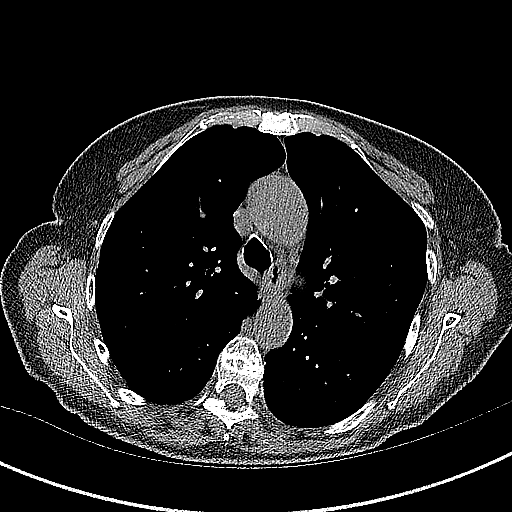
[im 99/145  lung]
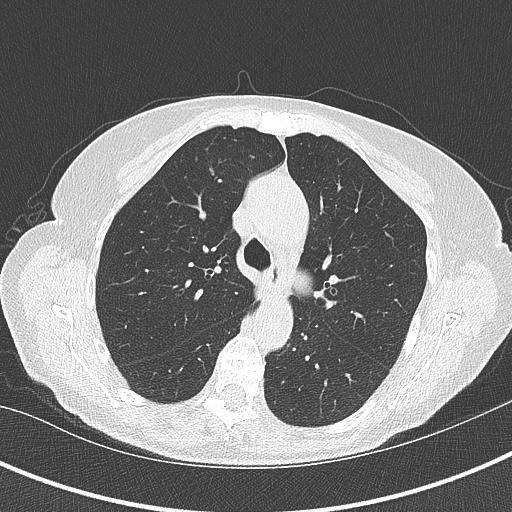
[im 114/145  lung]
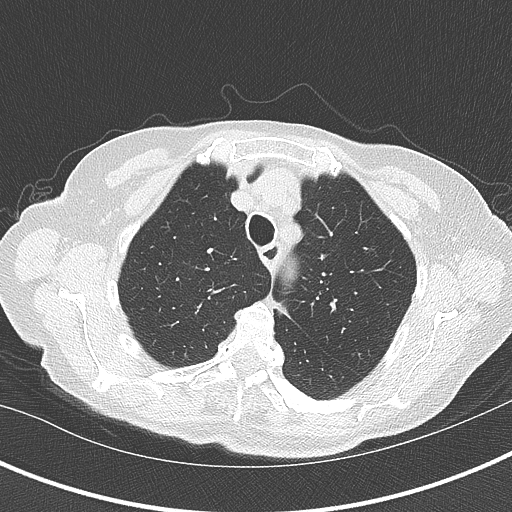
[im 122/145  lung]
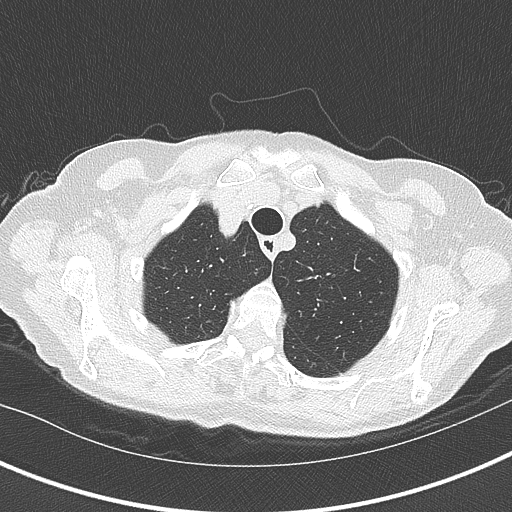
[im 137/145  lung]
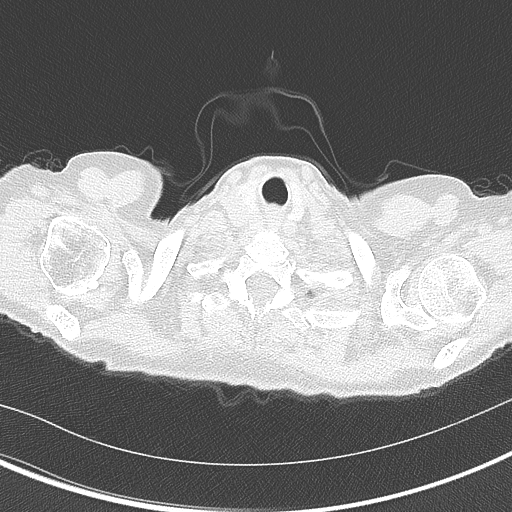

[15 of 36 positions shown; findings below may reference images not displayed]

FINDINGS: Cardiovascular: Ascending thoracic aortic dilation approximately 4
cm.

Minimal calcified atheromatous plaque visible in the thoracic aorta.
Tortuous descending thoracic aorta with normal caliber. Normal
caliber central pulmonary vessels. Coronary artery disease of LEFT
coronary circulation as before. Limited assessment of cardiovascular
structures given lack of intravenous contrast.

Mediastinum/Nodes: Thoracic inlet structures are normal.

No axillary lymphadenopathy. No mediastinal or hilar
lymphadenopathy. Small hiatal hernia. Esophagus grossly normal.

Lungs/Pleura: Pulmonary emphysema. Biapical pleural and parenchymal
scarring. No effusion. No consolidation.

Irregular nodule in the RIGHT lung base measures 1.5 x 1 cm
previously 1.5 x 1.1 cm. This abuts the pleural surface.

Airways are patent.

Upper Abdomen: Incidental imaging of upper abdominal contents
without acute process. Imaged portions of the liver, gallbladder,
pancreas, spleen and adrenal glands are unremarkable.

Musculoskeletal: No acute bone finding. No destructive bone process.
T6 compression fracture with chronic features and spinal
degenerative changes showing no change since previous imaging.
IMPRESSION: 1. Irregular nodule in the RIGHT lung base measures 1.5 x 1 cm
previously 1.5 x 1.1 cm. This abuts the pleural surface. Findings
remain concerning for neoplasm.
2. Ascending thoracic aortic dilation approximately 4 cm. Recommend
annual imaging followup by CTA or MRA. This recommendation follows
1757 ACCF/AHA/AATS/ACR/ASA/SCA/DL/ERXLEBEN/PREKESE/SUZUKI Guidelines for the
Diagnosis and Management of Patients with Thoracic Aortic Disease.
Circulation. 1757; 121: E266-e369. Aortic aneurysm NOS (W1DME-VMQ.I)
3. Coronary artery disease of LEFT coronary circulation as before.
4. Small hiatal hernia.
5. Emphysema and aortic atherosclerosis.

Aortic Atherosclerosis (W1DME-37P.P) and Emphysema (W1DME-IFX.B).

## 2022-06-18 ENCOUNTER — Ambulatory Visit: Payer: Medicare Other | Attending: Internal Medicine | Admitting: Internal Medicine

## 2022-06-18 ENCOUNTER — Encounter: Payer: Self-pay | Admitting: Internal Medicine

## 2022-06-18 VITALS — BP 120/70 | HR 81 | Ht 62.0 in | Wt 110.2 lb

## 2022-06-18 DIAGNOSIS — I77810 Thoracic aortic ectasia: Secondary | ICD-10-CM

## 2022-06-18 DIAGNOSIS — I6340 Cerebral infarction due to embolism of unspecified cerebral artery: Secondary | ICD-10-CM

## 2022-06-18 DIAGNOSIS — Q2112 Patent foramen ovale: Secondary | ICD-10-CM

## 2022-06-18 DIAGNOSIS — I10 Essential (primary) hypertension: Secondary | ICD-10-CM

## 2022-06-18 NOTE — Progress Notes (Signed)
Cardiology Office Note:    Date:  06/18/2022   ID:  Tracey Holmes, DOB October 31, 1933, MRN [id number]  PCP:  Harlan Stains, MD   [place] [facility] Cardiologist:  Werner Lean, MD     Referring MD: Harlan Stains, MD   CC: Stroke prevention  History of Present Illness:    Tracey Holmes is a 89 y.o. female with a hx of Aortic dilation 40 mm, HTN, HLD, and embolic device.  Did not get PFO closure due to elevated RAISE score.  Patient notes that she is doing well.   Since last visit notes no that she is still largely sedentary . There are no interval hospital/ED visit.   Ambulatory with the walker.  She has gain a lot of her strength back. Every two weeks goes and does her groceries.  No chest pain or pressure.  No SOB/DOE and no PND/Orthopnea.  No weight gain or leg swelling.  No palpitations or syncope.  Past Medical History:  Diagnosis Date   Aneurysm, descending thoracic aorta    Proximal (1.5 cm)   Arthritis    right knee   Cystic kidney disease    DVT (deep venous thrombosis) (HCC)    Dyslipidemia    History of kidney stones    HTN (hypertension)    Personal history of kidney stones    Stroke [facility]) 924/21    Past Surgical History:  Procedure Laterality Date   BRONCHIAL BIOPSY  09/20/2020   Procedure: BRONCHIAL BIOPSIES;  Surgeon: Garner Nash, DO;  Location: [place] [facility];  Service: Pulmonary;;   BRONCHIAL BRUSHINGS  09/20/2020   Procedure: BRONCHIAL BRUSHINGS;  Surgeon: Garner Nash, DO;  Location: [place];  Service: Pulmonary;;   BRONCHIAL NEEDLE ASPIRATION BIOPSY  09/20/2020   Procedure: BRONCHIAL NEEDLE ASPIRATION BIOPSIES;  Surgeon: Garner Nash, DO;  Location: [place];  Service: Pulmonary;;   BRONCHIAL WASHINGS  09/20/2020   Procedure: BRONCHIAL WASHINGS;  Surgeon: Garner Nash, DO;  Location: [place];  Service: Pulmonary;;   BUBBLE STUDY  07/03/2020   Procedure: BUBBLE STUDY;  Surgeon: Werner Lean, MD;  Location: [place];  Service: Cardiovascular;;   JOINT REPLACEMENT     TEE WITHOUT CARDIOVERSION N/A 07/03/2020   Procedure: TRANSESOPHAGEAL ECHOCARDIOGRAM (TEE);  Surgeon: Werner Lean, MD;  Location: [facility];  Service: Cardiovascular;  Laterality: N/A;   TONSILLECTOMY     age 82 per patient   TOTAL HIP ARTHROPLASTY Right 01/28/2020   Procedure: TOTAL HIP ARTHROPLASTY ANTERIOR APPROACH;  Surgeon: Rod Can, MD;  Location: [place];  Service: Orthopedics;  Laterality: Right;   VIDEO BRONCHOSCOPY WITH ENDOBRONCHIAL NAVIGATION Right 09/20/2020   Procedure: VIDEO BRONCHOSCOPY WITH ENDOBRONCHIAL NAVIGATION;  Surgeon: Garner Nash, DO;  Location: [place];  Service: Pulmonary;  Laterality: Right;    Current Medications: Current Meds  Medication Sig   acetaminophen (TYLENOL) 500 MG tablet Take 500 mg by mouth 2 (two) times daily as needed (pain.).   amLODipine (NORVASC) 5 MG tablet Take 5 mg by mouth in the morning.   aspirin EC 81 MG tablet Take 81 mg by mouth in the morning.   brimonidine (ALPHAGAN) 0.2 % ophthalmic solution Place 1 drop into both eyes 3 (three) times daily.   Cholecalciferol (VITAMIN D) 2000 UNITS tablet Take 2,000 Units by mouth in the morning.   denosumab (PROLIA) 60 MG/ML SOLN injection Inject 60 mg into the skin every 6 (six) months. Administer in upper arm, thigh, or abdomen  dorzolamide-timolol (COSOPT) 22.3-6.8 MG/ML ophthalmic solution Place 1 drop into both eyes 2 (two) times daily.   lactose free nutrition (BOOST) LIQD Take 237 mLs by mouth every other day.   loratadine (CLARITIN) 10 MG tablet Take 10 mg by mouth at bedtime.   Multiple Vitamin (MULTIVITAMIN WITH MINERALS) TABS tablet Take 1 tablet by mouth in the morning. One-A-Day   Netarsudil-Latanoprost (ROCKLATAN) 0.02-0.005 % SOLN Place 1 drop into both eyes at bedtime.   niacin 250 MG tablet Take 250 mg by mouth in the morning.   Nutritional Supplements (CARNATION  BREAKFAST ESSENTIALS) PACK Take 1 packet by mouth every other day.   polyethylene glycol (MIRALAX / GLYCOLAX) 17 g packet Take 17 g by mouth daily as needed for mild constipation.   pravastatin (PRAVACHOL) 80 MG tablet Take 80 mg by mouth every evening.     Allergies:   Benicar [olmesartan], Diovan [valsartan], Fosamax [alendronate], and Penicillins   Social History   Socioeconomic History   Marital status: Single    Spouse name: Not on file   Number of children: Not on file   Years of education: Not on file   Highest education level: Not on file  Occupational History   Not on file  Tobacco Use   Smoking status: Never   Smokeless tobacco: Never  Vaping Use   Vaping Use: Never used  Substance and Sexual Activity   Alcohol use: No   Drug use: No   Sexual activity: Not on file  Other Topics Concern   Not on file  Social History Narrative   Lives alone   Right Handed   Drinks 1 cup caffeine daily   Social Determinants of Health   Financial Resource Strain: Not on file  Food Insecurity: Not on file  Transportation Needs: Not on file  Physical Activity: Not on file  Stress: Not on file  Social Connections: Not on file    Social: Comes with friend Chong Sicilian or Friend Tammy  Family History: No family history of stroke  ROS:   Please see the history of present illness.     All other systems reviewed and are negative.  EKGs/Labs/Other Studies Reviewed:    The following studies were reviewed today:  EKG:  EKG is  ordered today.  The ekg ordered today demonstrates  06/18/22: SR low voltage 81 bpm  Cardiac Studies & Procedures       ECHOCARDIOGRAM  ECHOCARDIOGRAM LIMITED BUBBLE STUDY 06/11/2020  Narrative ECHOCARDIOGRAM LIMITED REPORT    Patient Name:   Tracey Holmes Date of Exam: 06/11/2020 Medical Rec #:  [id number]         Height:       64.0 in Accession #:    [id number]        Weight:       113.0 lb Date of Birth:  [birth year]         BSA:          1.535  m Patient Age:    71 years          BP:           130/80 mmHg Patient Gender: F                 HR:           71 bpm. Exam Location:  [address]  Procedure: 2D Echo, Cardiac Doppler, Color Doppler and Saline Contrast Bubble Study  Indications:    I63.9 Stroke  History:  Patient has prior history of Echocardiogram examinations, most recent 01/31/2020. Risk Factors:Hypertension and Dyslipidemia. AAA.  Sonographer:    Cresenciano Lick RDCS Referring Phys: [id number] [facility] A Liylah Najarro  IMPRESSIONS   1. Left ventricular ejection fraction, by estimation, is 65 to 70%. The left ventricle has normal function. The left ventricle has no regional wall motion abnormalities. 2. The aortic valve is tricuspid. Aortic valve regurgitation is trivial. 3. Aortic dilatation noted. There is mild dilatation of the ascending aorta, measuring 40 mm. 4. Evidence of atrial level shunting detected by color flow Doppler. Agitated saline contrast bubble study was positive with shunting observed within 3-6 cardiac cycles suggestive of interatrial shunt. There is a moderately sized patent foramen ovale with predominantly right to left shunting across the atrial septum.  Comparison(s): Changes from prior study are noted. 01/31/2020: LVEF 65-70%, dilated aorta to 40 mm.  Conclusion(s)/Recommendation(s): Moderate-sized PFO wtih right to left flow is noted.  FINDINGS Left Ventricle: Left ventricular ejection fraction, by estimation, is 65 to 70%. The left ventricle has normal function. The left ventricle has no regional wall motion abnormalities. The left ventricular internal cavity size was normal in size. There is no left ventricular hypertrophy.  Aortic Valve: The aortic valve is tricuspid. Aortic valve regurgitation is trivial.  Aorta: Aortic dilatation noted. There is mild dilatation of the ascending aorta, measuring 40 mm.  IAS/Shunts: Evidence of atrial level shunting detected by color flow  Doppler. Agitated saline contrast was given intravenously to evaluate for intracardiac shunting. Agitated saline contrast bubble study was positive with shunting observed within 3-6 cardiac cycles suggestive of interatrial shunt. A moderately sized patent foramen ovale is detected with predominantly right to left shunting across the atrial septum.  LEFT VENTRICLE PLAX 2D LVIDd:         3.10 cm  Diastology LVIDs:         2.00 cm  LV e' medial:    6.59 cm/s LV PW:         0.80 cm  LV E/e' medial:  12.8 LV IVS:        0.80 cm  LV e' lateral:   7.83 cm/s LVOT diam:     1.90 cm  LV E/e' lateral: 10.8 LV SV:         66 LV SV Index:   43 LVOT Area:     2.84 cm   RIGHT VENTRICLE RV Basal diam:  2.80 cm RV S prime:     12.80 cm/s TAPSE (M-mode): 1.7 cm  LEFT ATRIUM             Index       RIGHT ATRIUM          Index LA diam:        3.90 cm 2.54 cm/m  RA Area:     6.81 cm LA Vol (A2C):   26.0 ml 16.94 ml/m RA Volume:   12.40 ml 8.08 ml/m LA Vol (A4C):   20.9 ml 13.62 ml/m LA Biplane Vol: 23.6 ml 15.38 ml/m AORTIC VALVE LVOT Vmax:   114.00 cm/s LVOT Vmean:  82.200 cm/s LVOT VTI:    0.234 m  AORTA Ao Root diam: 3.00 cm Ao Asc diam:  4.00 cm  MITRAL VALVE MV Area (PHT): 3.72 cm     SHUNTS MV Decel Time: 204 msec     Systemic VTI:  0.23 m MV E velocity: 84.20 cm/s   Systemic Diam: 1.90 cm MV A velocity: 132.00 cm/s MV E/A ratio:  0.64  Lyman Bishop MD Electronically signed by Lyman Bishop MD Signature Date/Time: 06/11/2020/10:43:09 PM    Final   TEE  ECHO TEE 07/03/2020  Narrative TRANSESOPHOGEAL ECHO REPORT    Patient Name:   Tracey Holmes Date of Exam: 07/03/2020 Medical Rec #:  [id number]         Height:       63.0 in Accession #:    [id number]        Weight:       106.0 lb Date of Birth:  02/14/1934         BSA:          1.476 m Patient Age:    77 years          BP:           145/64 mmHg Patient Gender: F                 HR:           80 bpm. Exam  Location:  Inpatient  Procedure: Transesophageal Echo, Color Doppler and Cardiac Doppler  Indications:     PFO  History:         Patient has prior history of Echocardiogram examinations, most recent 06/11/2020. Stroke; Risk Factors:Hypertension and Dyslipidemia.  Sonographer:     Mikki Santee RDCS (AE) Referring Phys:  [id number] [facility] Spell Diagnosing Phys: Rudean Haskell MD  PROCEDURE: After discussion of the risks and benefits of a TEE, an informed consent was obtained from the patient. The transesophogeal probe was passed without difficulty through the esophogus of the patient. Sedation performed by different physician. The patient was monitored while under deep sedation. Anesthestetic sedation was provided intravenously by Anesthesiology: 56m of Propofol, 243mof Lidocaine. The patient developed no complications during the procedure.  IMPRESSIONS   1. Left ventricular ejection fraction, by estimation, is 60 to 65%. The left ventricle has normal function. The left ventricle has no regional wall motion abnormalities. 2. Possible echodensity in the mid esophageal views; when viewing biplane and deep gastric images this is most consistent with prominent trabeculations (morphologically normal). Right ventricular systolic function is normal. The right ventricular size is normal. 3. No left atrial/left atrial appendage thrombus was detected. The LAA emptying velocity was 60 cm/s. 4. The mitral valve is grossly normal. No evidence of mitral valve regurgitation. There is mild prolapse of of the mitral valve. 5. The aortic valve is tricuspid. Aortic valve regurgitation is trivial. 6. Ascending aortic height index 2.43 cm/m; increasing the characterization of dilation from borderline to mild. There is mild dilatation of the ascending aorta, measuring 39 mm. There is mild (Grade II) plaque involving the descending aorta. 7. Evidence of atrial level shunting detected by color  flow Doppler. Agitated saline contrast bubble study was positive with shunting observed within 3-6 cardiac cycles suggestive of interatrial shunt. There is a large patent foramen ovale with predominantly left to right shunting across the atrial septum.  Comparison(s): Compared with 06/11/20- study confirms the shunt is from PFO.  FINDINGS Left Ventricle: Left ventricular ejection fraction, by estimation, is 60 to 65%. The left ventricle has normal function. The left ventricle has no regional wall motion abnormalities. The left ventricular internal cavity size was normal in size.  Right Ventricle: Possible echodensity in the mid esophageal views; when viewing biplane and deep gastric images this is most consistent with prominent trabeculations (morphologically normal). The right ventricular size is normal. No increase in right ventricular wall thickness. Right ventricular systolic  function is normal.  Left Atrium: Left atrial size was normal in size. No left atrial/left atrial appendage thrombus was detected. The LAA emptying velocity was 60 cm/s.  Right Atrium: Right atrial size was normal in size.  Pericardium: There is no evidence of pericardial effusion.  Mitral Valve: The mitral valve is grossly normal. There is mild prolapse of of the mitral valve. No evidence of mitral valve regurgitation.  Tricuspid Valve: The tricuspid valve is grossly normal. Tricuspid valve regurgitation is trivial.  Aortic Valve: The aortic valve is tricuspid. Aortic valve regurgitation is trivial.  Pulmonic Valve: The pulmonic valve was grossly normal. Pulmonic valve regurgitation is not visualized.  Aorta: Ascending aortic height index 2.43 cm/m; increasing the characterization of dilation from borderline to mild. The aortic root is normal in size and structure. There is mild dilatation of the ascending aorta, measuring 39 mm. There is mild (Grade II) plaque involving the descending aorta.  IAS/Shunts: The  interatrial septum appears to be lipomatous. Evidence of atrial level shunting detected by color flow Doppler. Agitated saline contrast bubble study was positive with shunting observed within 3-6 cardiac cycles suggestive of interatrial shunt. A large patent foramen ovale is detected with predominantly left to right shunting across the atrial septum.    3D Volume EF LV 3D EDV:   82.92 ml LV 3D ESV:   29.31 ml  3D Volume EF: 3D EF:        65 %  Rudean Haskell MD Electronically signed by Rudean Haskell MD Signature Date/Time: 07/03/2020/4:32:13 PM    Final   MONITORS  CARDIAC EVENT MONITOR 03/24/2020  Narrative  Patient had an avg HR of 81 bpm.  Predominant underlying rhythm was sinus rhythm.  Isolated PACs were rare (<1.0%).  Isolated PVCs were rare (<1.0%).  No evidence of atrial fibrillation.  Triggered and diary events associated with sinus rhythm.  No malignant arrhythmias or evidence of atrial fibrillation.            Recent Labs: No results found for requested labs within last 365 days.  Recent Lipid Panel    Component Value Date/Time   CHOL 135 01/31/2020 0656   TRIG 84 01/31/2020 0656   HDL 45 01/31/2020 0656   CHOLHDL 3.0 01/31/2020 0656   VLDL 17 01/31/2020 0656   LDLCALC 73 01/31/2020 0656       Physical Exam:    VS:  BP 120/70   Pulse 81   Ht 5' 2"$  (1.575 m)   Wt 110 lb 3.2 oz (50 kg)   SpO2 96%   BMI 20.16 kg/m     Wt Readings from Last 3 Encounters:  06/18/22 110 lb 3.2 oz (50 kg)  10/29/21 111 lb (50.3 kg)  05/22/21 113 lb (51.3 kg)    GEN:  elderly female in no distress HEENT: Normal CARDIAC: RRR, no murmurs, rubs, gallops RESPIRATORY:  Clear to auscultation without rales, wheezing or rhonchi  ABDOMEN: Soft, non-tender, non-distended MUSCULOSKELETAL:  No edema; No deformity  SKIN: Warm and dry NEUROLOGIC:  Alert and oriented x 3 PSYCHIATRIC:  Normal affect   ASSESSMENT:    1. Essential hypertension   2.  Embolic stroke involving cerebral artery ([place])   3. PFO (patent foramen ovale)   4. Aortic root dilation (HCC)    PLAN:    HTN - controlled on current therapy  HLD Embolic Stroke - LDL at goal - see prior notes (Drs. Cooper/Sethi) given ROPE score, age and comorbidity no plan for PFO closure  Mild Aortic Dilation - discussed with patient and health care POA; given the limited utility in her aortic screening (she would not be an ideal surgical candidate, I am unclear aortic dimensions will rise that rapidly; we discussed options; after SDM- we will defer screening echo  One year with me or [place] if patients prefers       Medication Adjustments/Labs and Tests Ordered: Current medicines are reviewed at length with the patient today.  Concerns regarding medicines are outlined above.  Orders Placed This Encounter  Procedures   EKG 12-Lead   No orders of the defined types were placed in this encounter.   Patient Instructions  Medication Instructions:  Your physician recommends that you continue on your current medications as directed. Please refer to the Current Medication list given to you today.  *If you need a refill on your cardiac medications before your next appointment, please call your pharmacy*   Lab Work: NONE If you have labs (blood work) drawn today and your tests are completely normal, you will receive your results only by: [place] (if you have MyChart) OR A paper copy in the mail If you have any lab test that is abnormal or we need to change your treatment, we will call you to review the results.   Testing/Procedures: NONE   Follow-Up: At [facility], you and your health needs are our priority.  As part of our continuing mission to provide you with exceptional heart care, we have created designated Provider Care Teams.  These Care Teams include your primary Cardiologist (physician) and Advanced Practice Providers (APPs -  Physician  Assistants and Nurse Practitioners) who all work together to provide you with the care you need, when you need it.  We recommend signing up for the patient portal called "MyChart".  Sign up information is provided on this After Visit Summary.  MyChart is used to connect with patients for Virtual Visits (Telemedicine).  Patients are able to view lab/test results, encounter notes, upcoming appointments, etc.  Non-urgent messages can be sent to your provider as well.   To learn more about what you can do with MyChart, go to [email or web address].    Your next appointment:   1 year(s)  Provider:   Rudean Haskell, MD       Signed, Werner Lean, MD  06/18/2022 4:35 PM    [place]

## 2022-06-18 NOTE — Patient Instructions (Signed)
Medication Instructions:  Your physician recommends that you continue on your current medications as directed. Please refer to the Current Medication list given to you today.  *If you need a refill on your cardiac medications before your next appointment, please call your pharmacy*   Lab Work: NONE If you have labs (blood work) drawn today and your tests are completely normal, you will receive your results only by: [place] (if you have MyChart) OR A paper copy in the mail If you have any lab test that is abnormal or we need to change your treatment, we will call you to review the results.   Testing/Procedures: NONE   Follow-Up: At [facility], you and your health needs are our priority.  As part of our continuing mission to provide you with exceptional heart care, we have created designated Provider Care Teams.  These Care Teams include your primary Cardiologist (physician) and Advanced Practice Providers (APPs -  Physician Assistants and Nurse Practitioners) who all work together to provide you with the care you need, when you need it.  We recommend signing up for the patient portal called "MyChart".  Sign up information is provided on this After Visit Summary.  MyChart is used to connect with patients for Virtual Visits (Telemedicine).  Patients are able to view lab/test results, encounter notes, upcoming appointments, etc.  Non-urgent messages can be sent to your provider as well.   To learn more about what you can do with MyChart, go to [email or web address].    Your next appointment:   1 year(s)  Provider:   Rudean Haskell, MD

## 2022-07-15 DIAGNOSIS — M810 Age-related osteoporosis without current pathological fracture: Secondary | ICD-10-CM | POA: Diagnosis not present

## 2022-07-15 DIAGNOSIS — I10 Essential (primary) hypertension: Secondary | ICD-10-CM | POA: Diagnosis not present

## 2022-07-15 DIAGNOSIS — E785 Hyperlipidemia, unspecified: Secondary | ICD-10-CM | POA: Diagnosis not present

## 2022-07-15 DIAGNOSIS — M7989 Other specified soft tissue disorders: Secondary | ICD-10-CM | POA: Diagnosis not present

## 2022-07-15 DIAGNOSIS — D692 Other nonthrombocytopenic purpura: Secondary | ICD-10-CM | POA: Diagnosis not present

## 2022-09-15 DIAGNOSIS — H401122 Primary open-angle glaucoma, left eye, moderate stage: Secondary | ICD-10-CM | POA: Diagnosis not present

## 2022-09-15 DIAGNOSIS — Z961 Presence of intraocular lens: Secondary | ICD-10-CM | POA: Diagnosis not present

## 2022-09-15 DIAGNOSIS — H401113 Primary open-angle glaucoma, right eye, severe stage: Secondary | ICD-10-CM | POA: Diagnosis not present

## 2022-09-15 DIAGNOSIS — H04123 Dry eye syndrome of bilateral lacrimal glands: Secondary | ICD-10-CM | POA: Diagnosis not present

## 2023-01-14 DIAGNOSIS — J309 Allergic rhinitis, unspecified: Secondary | ICD-10-CM | POA: Diagnosis not present

## 2023-01-14 DIAGNOSIS — E785 Hyperlipidemia, unspecified: Secondary | ICD-10-CM | POA: Diagnosis not present

## 2023-01-14 DIAGNOSIS — I10 Essential (primary) hypertension: Secondary | ICD-10-CM | POA: Diagnosis not present

## 2023-01-14 DIAGNOSIS — I712 Thoracic aortic aneurysm, without rupture, unspecified: Secondary | ICD-10-CM | POA: Diagnosis not present

## 2023-01-14 DIAGNOSIS — R7303 Prediabetes: Secondary | ICD-10-CM | POA: Diagnosis not present

## 2023-01-14 DIAGNOSIS — E559 Vitamin D deficiency, unspecified: Secondary | ICD-10-CM | POA: Diagnosis not present

## 2023-01-14 DIAGNOSIS — E46 Unspecified protein-calorie malnutrition: Secondary | ICD-10-CM | POA: Diagnosis not present

## 2023-01-14 DIAGNOSIS — Z9989 Dependence on other enabling machines and devices: Secondary | ICD-10-CM | POA: Diagnosis not present

## 2023-01-14 DIAGNOSIS — Z0000 Encounter for general adult medical examination without abnormal findings: Secondary | ICD-10-CM | POA: Diagnosis not present

## 2023-01-14 DIAGNOSIS — M810 Age-related osteoporosis without current pathological fracture: Secondary | ICD-10-CM | POA: Diagnosis not present

## 2023-03-17 DIAGNOSIS — Z961 Presence of intraocular lens: Secondary | ICD-10-CM | POA: Diagnosis not present

## 2023-03-17 DIAGNOSIS — H04123 Dry eye syndrome of bilateral lacrimal glands: Secondary | ICD-10-CM | POA: Diagnosis not present

## 2023-03-17 DIAGNOSIS — H401122 Primary open-angle glaucoma, left eye, moderate stage: Secondary | ICD-10-CM | POA: Diagnosis not present

## 2023-03-17 DIAGNOSIS — H401113 Primary open-angle glaucoma, right eye, severe stage: Secondary | ICD-10-CM | POA: Diagnosis not present

## 2023-07-14 DIAGNOSIS — E785 Hyperlipidemia, unspecified: Secondary | ICD-10-CM | POA: Diagnosis not present

## 2023-07-14 DIAGNOSIS — E559 Vitamin D deficiency, unspecified: Secondary | ICD-10-CM | POA: Diagnosis not present

## 2023-07-14 DIAGNOSIS — R7303 Prediabetes: Secondary | ICD-10-CM | POA: Diagnosis not present

## 2023-07-14 DIAGNOSIS — I10 Essential (primary) hypertension: Secondary | ICD-10-CM | POA: Diagnosis not present

## 2023-07-14 DIAGNOSIS — E441 Mild protein-calorie malnutrition: Secondary | ICD-10-CM | POA: Diagnosis not present

## 2023-07-14 DIAGNOSIS — M810 Age-related osteoporosis without current pathological fracture: Secondary | ICD-10-CM | POA: Diagnosis not present

## 2023-08-25 ENCOUNTER — Ambulatory Visit: Payer: Medicare Other | Attending: Internal Medicine | Admitting: Internal Medicine

## 2023-08-25 NOTE — Progress Notes (Deleted)
 Cardiology Office Note:    Date:  08/25/2023   ID:  Tracey Holmes, DOB March 02, 1934, MRN [id number]  PCP:  Tracey Grave, MD   [facility] Providers Cardiologist:  Tracey Melody, MD     Referring MD: Tracey Grave, MD   CC: Stroke prevention  History of Present Illness:    Tracey Holmes is a 89 y.o. female with a hx of Aortic dilation 40 mm, HTN, HLD, and embolic device.  Did not get PFO closure due to elevated RAISE score.  Patient notes that she is doing well.   Since last visit notes no that she is still largely sedentary . There are no interval hospital/ED visit.   Ambulatory with the walker.  She has gain a lot of her strength back. Every two weeks goes and does her groceries.  No chest pain or pressure.  No SOB/DOE and no PND/Orthopnea.  No weight gain or leg swelling.  No palpitations or syncope.  Past Medical History:  Diagnosis Date   Aneurysm, descending thoracic aorta    Proximal (1.5 cm)   Arthritis    right knee   Cystic kidney disease    DVT (deep venous thrombosis) (HCC)    Dyslipidemia    History of kidney stones    HTN (hypertension)    Personal history of kidney stones    Stroke [facility]) 924/21    Past Surgical History:  Procedure Laterality Date   BRONCHIAL BIOPSY  09/20/2020   Procedure: BRONCHIAL BIOPSIES;  Surgeon: Tracey Brownie, DO;  Location: [facility] ENDOSCOPY;  Service: Pulmonary;;   BRONCHIAL BRUSHINGS  09/20/2020   Procedure: BRONCHIAL BRUSHINGS;  Surgeon: Tracey Brownie, DO;  Location: [facility];  Service: Pulmonary;;   BRONCHIAL NEEDLE ASPIRATION BIOPSY  09/20/2020   Procedure: BRONCHIAL NEEDLE ASPIRATION BIOPSIES;  Surgeon: Tracey Brownie, DO;  Location: [facility];  Service: Pulmonary;;   BRONCHIAL WASHINGS  09/20/2020   Procedure: BRONCHIAL WASHINGS;  Surgeon: Tracey Brownie, DO;  Location: [facility];  Service: Pulmonary;;   BUBBLE STUDY  07/03/2020   Procedure: BUBBLE STUDY;  Surgeon: Tracey Melody, MD;  Location: [facility];  Service: Cardiovascular;;   JOINT REPLACEMENT     TEE WITHOUT CARDIOVERSION N/A 07/03/2020   Procedure: TRANSESOPHAGEAL ECHOCARDIOGRAM (TEE);  Surgeon: Tracey Melody, MD;  Location: [facility] ENDOSCOPY;  Service: Cardiovascular;  Laterality: N/A;   TONSILLECTOMY     age 75 per patient   TOTAL HIP ARTHROPLASTY Right 01/28/2020   Procedure: TOTAL HIP ARTHROPLASTY ANTERIOR APPROACH;  Surgeon: Tracey Hoose, MD;  Location: [facility] OR;  Service: Orthopedics;  Laterality: Right;   VIDEO BRONCHOSCOPY WITH ENDOBRONCHIAL NAVIGATION Right 09/20/2020   Procedure: VIDEO BRONCHOSCOPY WITH ENDOBRONCHIAL NAVIGATION;  Surgeon: Tracey Brownie, DO;  Location: [facility]OS[facility];  Service: Pulmonary;  Laterality: Right;    Current Medications: No outpatient medications have been marked as taking for the 08/25/23 encounter (Appointment) with Tracey Melody, MD.     Allergies:   Benicar [olmesartan], Diovan [valsartan], Fosamax [alendronate], and Penicillins   Social History   Socioeconomic History   Marital status: Single    Spouse name: Not on file   Number of children: Not on file   Years of education: Not on file   Highest education level: Not on file  Occupational History   Not on file  Tobacco Use   Smoking status: Never   Smokeless tobacco: Never  Vaping Use   Vaping status: Never Used  Substance and Sexual Activity  Alcohol use: No   Drug use: No   Sexual activity: Not on file  Other Topics Concern   Not on file  Social History Narrative   Lives alone   Right Handed   Drinks 1 cup caffeine daily   Social Drivers of Health   Financial Resource Strain: Not on file  Food Insecurity: Not on file  Transportation Needs: Not on file  Physical Activity: Not on file  Stress: Not on file  Social Connections: Not on file    Social: Comes with friend Tracey Holmes or Friend Tracey Holmes  Family History: No family history of stroke  ROS:   Please see the  history of present illness.     All other systems reviewed and are negative.  EKGs/Labs/Other Studies Reviewed:    The following studies were reviewed today:  EKG:  EKG is  ordered today.  The ekg ordered today demonstrates  06/18/22: SR low voltage 81 bpm  Cardiac Studies & Procedures   ______________________________________________________________________________________________     ECHOCARDIOGRAM  ECHOCARDIOGRAM LIMITED BUBBLE STUDY 06/11/2020  Narrative ECHOCARDIOGRAM LIMITED REPORT    Patient Name:   Tracey Holmes Date of Exam: 06/11/2020 Medical Rec #:  [id number]         Height:       64.0 in Accession #:    [id number]        Weight:       113.0 lb Date of Birth:  Jan 01, 1934         BSA:          1.535 m Patient Age:    87 years          BP:           130/80 mmHg Patient Gender: F                 HR:           71 bpm. Exam Location:  [address]  Procedure: 2D Echo, Cardiac Doppler, Color Doppler and Saline Contrast Bubble Study  Indications:    I63.9 Stroke  History:        Patient has prior history of Echocardiogram examinations, most recent 01/31/2020. Risk Factors:Hypertension and Dyslipidemia. AAA.  Sonographer:    Tracey Holmes RDCS Referring Phys: [id number] [facility] A Caila Cirelli  IMPRESSIONS   1. Left ventricular ejection fraction, by estimation, is 65 to 70%. The left ventricle has normal function. The left ventricle has no regional wall motion abnormalities. 2. The aortic valve is tricuspid. Aortic valve regurgitation is trivial. 3. Aortic dilatation noted. There is mild dilatation of the ascending aorta, measuring 40 mm. 4. Evidence of atrial level shunting detected by color flow Doppler. Agitated saline contrast bubble study was positive with shunting observed within 3-6 cardiac cycles suggestive of interatrial shunt. There is a moderately sized patent foramen ovale with predominantly right to left shunting across the atrial  septum.  Comparison(s): Changes from prior study are noted. 01/31/2020: LVEF 65-70%, dilated aorta to 40 mm.  Conclusion(s)/Recommendation(s): Moderate-sized PFO wtih right to left flow is noted.  FINDINGS Left Ventricle: Left ventricular ejection fraction, by estimation, is 65 to 70%. The left ventricle has normal function. The left ventricle has no regional wall motion abnormalities. The left ventricular internal cavity size was normal in size. There is no left ventricular hypertrophy.  Aortic Valve: The aortic valve is tricuspid. Aortic valve regurgitation is trivial.  Aorta: Aortic dilatation noted. There is mild dilatation of the ascending aorta, measuring 40 mm.  IAS/Shunts:  Evidence of atrial level shunting detected by color flow Doppler. Agitated saline contrast was given intravenously to evaluate for intracardiac shunting. Agitated saline contrast bubble study was positive with shunting observed within 3-6 cardiac cycles suggestive of interatrial shunt. A moderately sized patent foramen ovale is detected with predominantly right to left shunting across the atrial septum.  LEFT VENTRICLE PLAX 2D LVIDd:         3.10 cm  Diastology LVIDs:         2.00 cm  LV e' medial:    6.59 cm/s LV PW:         0.80 cm  LV E/e' medial:  12.8 LV IVS:        0.80 cm  LV e' lateral:   7.83 cm/s LVOT diam:     1.90 cm  LV E/e' lateral: 10.8 LV SV:         66 LV SV Index:   43 LVOT Area:     2.84 cm   RIGHT VENTRICLE RV Basal diam:  2.80 cm RV S prime:     12.80 cm/s TAPSE (M-mode): 1.7 cm  LEFT ATRIUM             Index       RIGHT ATRIUM          Index LA diam:        3.90 cm 2.54 cm/m  RA Area:     6.81 cm LA Vol (A2C):   26.0 ml 16.94 ml/m RA Volume:   12.40 ml 8.08 ml/m LA Vol (A4C):   20.9 ml 13.62 ml/m LA Biplane Vol: 23.6 ml 15.38 ml/m AORTIC VALVE LVOT Vmax:   114.00 cm/s LVOT Vmean:  82.200 cm/s LVOT VTI:    0.234 m  AORTA Ao Root diam: 3.00 cm Ao Asc diam:  4.00  cm  MITRAL VALVE MV Area (PHT): 3.72 cm     SHUNTS MV Decel Time: 204 msec     Systemic VTI:  0.23 m MV E velocity: 84.20 cm/s   Systemic Diam: 1.90 cm MV A velocity: 132.00 cm/s MV E/A ratio:  0.64  Dinah Franco MD Electronically signed by Dinah Franco MD Signature Date/Time: 06/11/2020/10:43:09 PM    Final   TEE  ECHO TEE 07/03/2020  Narrative TRANSESOPHOGEAL ECHO REPORT    Patient Name:   Jakie Mayhew Date of Exam: 07/03/2020 Medical Rec #:  [id number]         Height:       63.0 in Accession #:    [id number]        Weight:       106.0 lb Date of Birth:  27-Mar-1934         BSA:          1.476 m Patient Age:    87 years          BP:           145/64 mmHg Patient Gender: F                 HR:           80 bpm. Exam Location:  Inpatient  Procedure: Transesophageal Echo, Color Doppler and Cardiac Doppler  Indications:     PFO  History:         Patient has prior history of Echocardiogram examinations, most recent 06/11/2020. Stroke; Risk Factors:Hypertension and Dyslipidemia.  Sonographer:     Joleen Navy RDCS (AE) Referring Phys:  [id number] [facility] A Paulita Boss  Diagnosing Phys: Gloriann Larger MD  PROCEDURE: After discussion of the risks and benefits of a TEE, an informed consent was obtained from the patient. The transesophogeal probe was passed without difficulty through the esophogus of the patient. Sedation performed by different physician. The patient was monitored while under deep sedation. Anesthestetic sedation was provided intravenously by Anesthesiology: 90mg  of Propofol , 20mg  of Lidocaine . The patient developed no complications during the procedure.  IMPRESSIONS   1. Left ventricular ejection fraction, by estimation, is 60 to 65%. The left ventricle has normal function. The left ventricle has no regional wall motion abnormalities. 2. Possible echodensity in the mid esophageal views; when viewing biplane and deep gastric images this is most  consistent with prominent trabeculations (morphologically normal). Right ventricular systolic function is normal. The right ventricular size is normal. 3. No left atrial/left atrial appendage thrombus was detected. The LAA emptying velocity was 60 cm/s. 4. The mitral valve is grossly normal. No evidence of mitral valve regurgitation. There is mild prolapse of of the mitral valve. 5. The aortic valve is tricuspid. Aortic valve regurgitation is trivial. 6. Ascending aortic height index 2.43 cm/m; increasing the characterization of dilation from borderline to mild. There is mild dilatation of the ascending aorta, measuring 39 mm. There is mild (Grade II) plaque involving the descending aorta. 7. Evidence of atrial level shunting detected by color flow Doppler. Agitated saline contrast bubble study was positive with shunting observed within 3-6 cardiac cycles suggestive of interatrial shunt. There is a large patent foramen ovale with predominantly left to right shunting across the atrial septum.  Comparison(s): Compared with 06/11/20- study confirms the shunt is from PFO.  FINDINGS Left Ventricle: Left ventricular ejection fraction, by estimation, is 60 to 65%. The left ventricle has normal function. The left ventricle has no regional wall motion abnormalities. The left ventricular internal cavity size was normal in size.  Right Ventricle: Possible echodensity in the mid esophageal views; when viewing biplane and deep gastric images this is most consistent with prominent trabeculations (morphologically normal). The right ventricular size is normal. No increase in right ventricular wall thickness. Right ventricular systolic function is normal.  Left Atrium: Left atrial size was normal in size. No left atrial/left atrial appendage thrombus was detected. The LAA emptying velocity was 60 cm/s.  Right Atrium: Right atrial size was normal in size.  Pericardium: There is no evidence of pericardial  effusion.  Mitral Valve: The mitral valve is grossly normal. There is mild prolapse of of the mitral valve. No evidence of mitral valve regurgitation.  Tricuspid Valve: The tricuspid valve is grossly normal. Tricuspid valve regurgitation is trivial.  Aortic Valve: The aortic valve is tricuspid. Aortic valve regurgitation is trivial.  Pulmonic Valve: The pulmonic valve was grossly normal. Pulmonic valve regurgitation is not visualized.  Aorta: Ascending aortic height index 2.43 cm/m; increasing the characterization of dilation from borderline to mild. The aortic root is normal in size and structure. There is mild dilatation of the ascending aorta, measuring 39 mm. There is mild (Grade II) plaque involving the descending aorta.  IAS/Shunts: The interatrial septum appears to be lipomatous. Evidence of atrial level shunting detected by color flow Doppler. Agitated saline contrast bubble study was positive with shunting observed within 3-6 cardiac cycles suggestive of interatrial shunt. A large patent foramen ovale is detected with predominantly left to right shunting across the atrial septum.    3D Volume EF LV 3D EDV:   82.92 ml LV 3D ESV:   29.31 ml  3D Volume EF: 3D EF:        65 %  Gloriann Larger MD Electronically signed by Gloriann Larger MD Signature Date/Time: 07/03/2020/4:32:13 PM    Final  MONITORS  CARDIAC EVENT MONITOR 03/21/2020  Narrative  Patient had an avg HR of 81 bpm.  Predominant underlying rhythm was sinus rhythm.  Isolated PACs were rare (<1.0%).  Isolated PVCs were rare (<1.0%).  No evidence of atrial fibrillation.  Triggered and diary events associated with sinus rhythm.  No malignant arrhythmias or evidence of atrial fibrillation.       ______________________________________________________________________________________________       Recent Labs: No results found for requested labs within last 365 days.  Recent Lipid Panel     Component Value Date/Time   CHOL 135 01/31/2020 0656   TRIG 84 01/31/2020 0656   HDL 45 01/31/2020 0656   CHOLHDL 3.0 01/31/2020 0656   VLDL 17 01/31/2020 0656   LDLCALC 73 01/31/2020 0656       Physical Exam:    VS:  There were no vitals taken for this visit.    Wt Readings from Last 3 Encounters:  06/18/22 50 kg  10/29/21 50.3 kg  05/22/21 51.3 kg    GEN:  elderly female in no distress HEENT: Normal CARDIAC: RRR, no murmurs, rubs, gallops RESPIRATORY:  Clear to auscultation without rales, wheezing or rhonchi  ABDOMEN: Soft, non-tender, non-distended MUSCULOSKELETAL:  No edema; No deformity  SKIN: Warm and dry NEUROLOGIC:  Alert and oriented x 3 PSYCHIATRIC:  Normal affect   ASSESSMENT:    No diagnosis found.  PLAN:    HTN - controlled on current therapy  HLD Embolic Stroke - LDL at goal - see prior notes (Drs. Cooper/Sethi) given ROPE score, age and comorbidity no plan for PFO closure  Mild Aortic Dilation - discussed with patient and health care POA; given the limited utility in her aortic screening (she would not be an ideal surgical candidate, I am unclear aortic dimensions will rise that rapidly; we discussed options; after SDM- we will defer screening echo  One year with me or [place] if patients prefers       Medication Adjustments/Labs and Tests Ordered: Current medicines are reviewed at length with the patient today.  Concerns regarding medicines are outlined above.  No orders of the defined types were placed in this encounter.  No orders of the defined types were placed in this encounter.   There are no Patient Instructions on file for this visit.   Signed, Tracey Melody, MD  08/25/2023 8:17 AM    [facility]

## 2023-08-26 ENCOUNTER — Encounter: Payer: Self-pay | Admitting: Internal Medicine

## 2023-09-02 ENCOUNTER — Emergency Department (HOSPITAL_COMMUNITY)

## 2023-09-02 ENCOUNTER — Emergency Department (HOSPITAL_COMMUNITY)
Admission: EM | Admit: 2023-09-02 | Discharge: 2023-09-02 | Disposition: A | Discharge status: Home / Self Care | Attending: Emergency Medicine | Admitting: Emergency Medicine

## 2023-09-02 DIAGNOSIS — Z79899 Other long term (current) drug therapy: Secondary | ICD-10-CM | POA: Insufficient documentation

## 2023-09-02 DIAGNOSIS — R791 Abnormal coagulation profile: Secondary | ICD-10-CM | POA: Insufficient documentation

## 2023-09-02 DIAGNOSIS — Z743 Need for continuous supervision: Secondary | ICD-10-CM | POA: Diagnosis not present

## 2023-09-02 DIAGNOSIS — R531 Weakness: Secondary | ICD-10-CM | POA: Diagnosis not present

## 2023-09-02 DIAGNOSIS — S0990XA Unspecified injury of head, initial encounter: Secondary | ICD-10-CM | POA: Diagnosis not present

## 2023-09-02 DIAGNOSIS — R6889 Other general symptoms and signs: Secondary | ICD-10-CM | POA: Diagnosis not present

## 2023-09-02 DIAGNOSIS — W19XXXA Unspecified fall, initial encounter: Secondary | ICD-10-CM

## 2023-09-02 DIAGNOSIS — I6782 Cerebral ischemia: Secondary | ICD-10-CM | POA: Diagnosis not present

## 2023-09-02 DIAGNOSIS — I10 Essential (primary) hypertension: Secondary | ICD-10-CM | POA: Insufficient documentation

## 2023-09-02 DIAGNOSIS — G9389 Other specified disorders of brain: Secondary | ICD-10-CM | POA: Diagnosis not present

## 2023-09-02 DIAGNOSIS — Z7982 Long term (current) use of aspirin: Secondary | ICD-10-CM | POA: Diagnosis not present

## 2023-09-02 DIAGNOSIS — M542 Cervicalgia: Secondary | ICD-10-CM | POA: Diagnosis not present

## 2023-09-02 DIAGNOSIS — M47812 Spondylosis without myelopathy or radiculopathy, cervical region: Secondary | ICD-10-CM | POA: Diagnosis not present

## 2023-09-02 DIAGNOSIS — M4802 Spinal stenosis, cervical region: Secondary | ICD-10-CM | POA: Diagnosis not present

## 2023-09-02 DIAGNOSIS — I499 Cardiac arrhythmia, unspecified: Secondary | ICD-10-CM | POA: Diagnosis not present

## 2023-09-02 DIAGNOSIS — S199XXA Unspecified injury of neck, initial encounter: Secondary | ICD-10-CM | POA: Diagnosis not present

## 2023-09-02 DIAGNOSIS — R0689 Other abnormalities of breathing: Secondary | ICD-10-CM | POA: Diagnosis not present

## 2023-09-02 DIAGNOSIS — Y92009 Unspecified place in unspecified non-institutional (private) residence as the place of occurrence of the external cause: Secondary | ICD-10-CM | POA: Insufficient documentation

## 2023-09-02 DIAGNOSIS — M5021 Other cervical disc displacement,  high cervical region: Secondary | ICD-10-CM | POA: Diagnosis not present

## 2023-09-02 DIAGNOSIS — R471 Dysarthria and anarthria: Secondary | ICD-10-CM | POA: Diagnosis not present

## 2023-09-02 DIAGNOSIS — I63511 Cerebral infarction due to unspecified occlusion or stenosis of right middle cerebral artery: Secondary | ICD-10-CM | POA: Diagnosis not present

## 2023-09-02 LAB — CBC WITH DIFFERENTIAL/PLATELET
Abs Immature Granulocytes: 0 10*3/uL (ref 0.00–0.07)
Band Neutrophils: 1 %
Basophils Absolute: 0 10*3/uL (ref 0.0–0.1)
Basophils Relative: 0 %
Eosinophils Absolute: 0 10*3/uL (ref 0.0–0.5)
Eosinophils Relative: 0 %
HCT: 43.2 % (ref 36.0–46.0)
Hemoglobin: 14.8 g/dL (ref 12.0–15.0)
Lymphocytes Relative: 6 %
Lymphs Abs: 0.9 10*3/uL (ref 0.7–4.0)
MCH: 32.2 pg (ref 26.0–34.0)
MCHC: 34.3 g/dL (ref 30.0–36.0)
MCV: 93.9 fL (ref 80.0–100.0)
Monocytes Absolute: 1.5 10*3/uL — ABNORMAL HIGH (ref 0.1–1.0)
Monocytes Relative: 10 %
Neutro Abs: 12.3 10*3/uL — ABNORMAL HIGH (ref 1.7–7.7)
Neutrophils Relative %: 83 %
Platelets: 232 10*3/uL (ref 150–400)
RBC: 4.6 MIL/uL (ref 3.87–5.11)
RDW: 12.6 % (ref 11.5–15.5)
WBC: 14.6 10*3/uL — ABNORMAL HIGH (ref 4.0–10.5)
nRBC: 0 % (ref 0.0–0.2)

## 2023-09-02 LAB — URINALYSIS, ROUTINE W REFLEX MICROSCOPIC
Bacteria, UA: NONE SEEN
Bilirubin Urine: NEGATIVE
Glucose, UA: NEGATIVE mg/dL
Hgb urine dipstick: NEGATIVE
Ketones, ur: 20 mg/dL — AB
Leukocytes,Ua: NEGATIVE
Nitrite: NEGATIVE
Protein, ur: 30 mg/dL — AB
Specific Gravity, Urine: 1.016 (ref 1.005–1.030)
pH: 6 (ref 5.0–8.0)

## 2023-09-02 LAB — RAPID URINE DRUG SCREEN, HOSP PERFORMED
Amphetamines: NOT DETECTED
Barbiturates: NOT DETECTED
Benzodiazepines: NOT DETECTED
Cocaine: NOT DETECTED
Opiates: NOT DETECTED
Tetrahydrocannabinol: NOT DETECTED

## 2023-09-02 LAB — PROTIME-INR
INR: 0.9 (ref 0.8–1.2)
Prothrombin Time: 12.8 s (ref 11.4–15.2)

## 2023-09-02 LAB — COMPREHENSIVE METABOLIC PANEL WITH GFR
ALT: 30 U/L (ref 0–44)
AST: 51 U/L — ABNORMAL HIGH (ref 15–41)
Albumin: 3.9 g/dL (ref 3.5–5.0)
Alkaline Phosphatase: 43 U/L (ref 38–126)
Anion gap: 11 (ref 5–15)
BUN: 19 mg/dL (ref 8–23)
CO2: 19 mmol/L — ABNORMAL LOW (ref 22–32)
Calcium: 8.5 mg/dL — ABNORMAL LOW (ref 8.9–10.3)
Chloride: 107 mmol/L (ref 98–111)
Creatinine, Ser: 0.5 mg/dL (ref 0.44–1.00)
GFR, Estimated: >60 mL/min (ref >60)
Glucose, Bld: 149 mg/dL — ABNORMAL HIGH (ref 70–99)
Potassium: 3.2 mmol/L — ABNORMAL LOW (ref 3.5–5.1)
Sodium: 137 mmol/L (ref 135–145)
Total Bilirubin: 1.3 mg/dL — ABNORMAL HIGH (ref 0.0–1.2)
Total Protein: 6.3 g/dL — ABNORMAL LOW (ref 6.5–8.1)

## 2023-09-02 LAB — ETHANOL: Alcohol, Ethyl (B): <15 mg/dL (ref <15)

## 2023-09-02 LAB — CBG MONITORING, ED: Glucose-Capillary: 150 mg/dL — ABNORMAL HIGH (ref 70–99)

## 2023-09-02 NOTE — Discharge Instructions (Signed)
 Thank you for coming to [facility] Emergency Department. You were seen for fall at home, concern for possible speech changes. We did an exam, labs, and imaging, and these showed no acute findings. No new strokes found on the MRI, and there was no UTI on your urine.  Please follow up with your primary care provider within 1 week.   Do not hesitate to return to the ED or call 911 if you experience: -Worsening symptoms -Lightheadedness, passing out -Fevers/chills -Anything else that concerns you

## 2023-09-02 NOTE — Progress Notes (Signed)
 Stroke Alert cart activation at 1352. ED provider, Dr Annita Kindle, evaluates at [phone]. [facility] Neurologist, Dr Cori Dials, joins the telemedicine cart at 1357. Code Stroke cancelled by provider, who speaks directly with Dr Cleone Dad at [phone].

## 2023-09-02 NOTE — ED Triage Notes (Signed)
 Pt arrived via [facility] from home, code stroke called out in field at 1340, arrived to the ED at 1353, Per EMS per family and pt, unknown [facility]. Pt has had a prior stroke in the past in which pt has deficits from. C/o slurred speech and r side weakness. EDP at bedside and code stroke cancelled by EDP. Cbg 150

## 2023-09-02 NOTE — ED Notes (Signed)
 Code stroke cancelled at 1400

## 2023-09-02 NOTE — ED Provider Notes (Signed)
  EMERGENCY DEPARTMENT AT [facility] Provider Note   CSN: [id number] Arrival date & time: 09/02/23  1354     History {Add pertinent medical, surgical, social history, OB history to HPI:1} Chief Complaint  Patient presents with  . Code Stroke    Tracey Holmes is a 89 y.o. female with PMH as listed below who presents as a code stroke by EMS. She comes from facility where she was noted to have speech that is not her normal and to be leaning to the left. Unknown last known well by patient, facility, or EMS. Patient reports that she fell and is having neck pain. Patient unable to provide further history. She has h/o CVA w/ L -sided deficits at baseline. She states that her weakness on her left side feels normal to her. She fell when she was going to the bathroom but isn't sure what made her fall. She denies any CP, abd pain, N/V/D, or urinary sxs.    Past Medical History:  Diagnosis Date  . Aneurysm, descending thoracic aorta    Proximal (1.5 cm)  . Arthritis    right knee  . Cystic kidney disease   . DVT (deep venous thrombosis) (HCC)   . Dyslipidemia   . History of kidney stones   . HTN (hypertension)   . Personal history of kidney stones   . Stroke [facility]) 924/21       Home Medications Prior to Admission medications   Medication Sig Start Date End Date Taking? Authorizing Provider  acetaminophen  (TYLENOL ) 500 MG tablet Take 500 mg by mouth 2 (two) times daily as needed (pain.).   Yes [provider]  amLODipine  (NORVASC ) 5 MG tablet Take 5 mg by mouth in the morning. 12/29/10  Yes [provider]  aspirin  EC 81 MG tablet Take 81 mg by mouth in the morning and at bedtime.   Yes [provider]  brimonidine  (ALPHAGAN ) 0.2 % ophthalmic solution Place 1 drop into both eyes 3 (three) times daily. 10/26/19  Yes [provider]  cetirizine (ZYRTEC) 10 MG tablet Take 10 mg by mouth at bedtime.   Yes [provider]   Cholecalciferol (VITAMIN D) 2000 UNITS tablet Take 2,000 Units by mouth in the morning.   Yes [provider]  denosumab  (PROLIA ) 60 MG/ML SOLN injection Inject 60 mg into the skin every 6 (six) months. Administer in upper arm, thigh, or abdomen   Yes [provider]  dorzolamide-timolol (COSOPT) 22.3-6.8 MG/ML ophthalmic solution Place 1 drop into both eyes 2 (two) times daily.   Yes [provider]  lactose free nutrition (BOOST) LIQD Take 237 mLs by mouth every other day.   Yes [provider]  mirtazapine (REMERON) 15 MG tablet Take 15 mg by mouth at bedtime. 07/14/23  Yes [provider]  Multiple Vitamin (MULTIVITAMIN WITH MINERALS) TABS tablet Take 1 tablet by mouth in the morning. One-A-Day   Yes [provider]  Netarsudil -Latanoprost  (ROCKLATAN ) 0.02-0.005 % SOLN Place 1 drop into both eyes at bedtime. 03/22/20  Yes [provider]  niacin 250 MG tablet Take 250 mg by mouth in the morning.   Yes [provider]  Nutritional Supplements (CARNATION BREAKFAST ESSENTIALS) PACK Take 1 packet by mouth every other day.   Yes [provider]      Allergies    Benicar [olmesartan], Diovan [valsartan], Fosamax [alendronate], and Penicillins    Review of Systems   Review of Systems A 10 point review  of systems was performed and is negative unless otherwise reported in HPI.  Physical Exam Updated Vital Signs BP (!) 184/90 (BP Location: Left Arm)   Pulse 89   Temp 97.8 F (36.6 C) (Oral)   Resp 16   SpO2 96%  Physical Exam General: Normal appearing elderly female, lying in bed.  HEENT: PERRLA, EOMI, Sclera anicteric, MMM, trachea midline. Tongue protrudes midline.  Cardiology: RRR, no murmurs/rubs/gallops.   Resp: Normal respiratory rate and effort. CTAB, no wheezes, rhonchi, crackles.  Abd: Soft, non-tender, non-distended. No rebound tenderness or guarding.  GU: Deferred. MSK: No peripheral edema or signs  of trauma. Extremities without deformity or TTP. No cyanosis or clubbing. Skin: warm, dry. No  Back: No CVA tenderness. No midline C, T or L spine TTP or stepoffs.  Neuro: A&Ox4, CNs II-XII grossly intact. MAEs. Sensation grossly intact.  Psych: Normal mood and affect.   1a  Level of consciousness: 0=alert; keenly responsive  1b. LOC questions:  0=Performs both tasks correctly  1c. LOC commands: 0=Performs both tasks correctly  2.  Best Gaze: 0=normal  3.  Visual: 0=No visual loss  4. Facial Palsy: 0=Normal symmetric movement  5a.  Motor left arm: 1=Drift, limb holds 90 (or 45) degrees but drifts down before full 10 seconds: does not hit bed  5b.  Motor right arm: 0=No drift, limb holds 90 (or 45) degrees for full 10 seconds  6a. motor left leg: 1=Drift, limb holds 90 (or 45) degrees but drifts down before full 10 seconds: does not hit bed  6b  Motor right leg:  0=No drift, limb holds 90 (or 45) degrees for full 10 seconds  7. Limb Ataxia: 0=Absent  8.  Sensory: 1=Mild to moderate sensory loss; patient feels pinprick is less sharp or is dull on the affected side; there is a loss of superficial pain with pinprick but patient is aware She is being touched  9. Best Language:  1=Mild to moderate aphasia; some obvious loss of fluency or facility of comprehension without significant limitation on ideas expressed or form of expression.  10. Dysarthria: 0=Normal  11. Extinction and Inattention: 0=No abnormality   Total:   4        ED Results / Procedures / Treatments   Labs (all labs ordered are listed, but only abnormal results are displayed) Labs Reviewed  CBC WITH DIFFERENTIAL/PLATELET - Abnormal; Notable for the following components:      Result Value   WBC 14.6 (*)    Neutro Abs 12.3 (*)    Monocytes Absolute 1.5 (*)    All other components within normal limits  COMPREHENSIVE METABOLIC PANEL WITH GFR - Abnormal; Notable for the following components:   Potassium 3.2 (*)    CO2 19  (*)    Glucose, Bld 149 (*)    Calcium 8.5 (*)    Total Protein 6.3 (*)    AST 51 (*)    Total Bilirubin 1.3 (*)    All other components within normal limits  CBG MONITORING, ED - Abnormal; Notable for the following components:   Glucose-Capillary 150 (*)    All other components within normal limits  PROTIME-INR  ETHANOL  URINALYSIS, ROUTINE W REFLEX MICROSCOPIC  RAPID URINE DRUG SCREEN, HOSP PERFORMED    EKG EKG Interpretation Date/Time:  Thursday Sep 02 2023 14:25:58 EDT Ventricular Rate:  90 PR Interval:  176 QRS Duration:  100 QT Interval:  390 QTC Calculation: 478 R Axis:   67  Text Interpretation: Sinus rhythm  Low voltage, precordial leads Confirmed by Annita Kindle [phone]) on 09/02/2023 4:30:34 PM  Radiology CT Cervical Spine Wo Contrast Result Date: 09/02/2023 CLINICAL DATA:  Neck trauma (Age >= 65y) EXAM: CT CERVICAL SPINE WITHOUT CONTRAST TECHNIQUE: Multidetector CT imaging of the cervical spine was performed without intravenous contrast. Multiplanar CT image reconstructions were also generated. RADIATION DOSE REDUCTION: This exam was performed according to the departmental dose-optimization program which includes automated exposure control, adjustment of the mA and/or kV according to patient size and/or use of iterative reconstruction technique. COMPARISON:  None Available. FINDINGS: Alignment: Appropriate cervical lordosis without spondylolisthesis, uncovering of the facet joints, or significant widening of the spinous processes. No subluxation or abnormality identified at the craniovertebral junction. Skull base and vertebrae: Vertebral body heights are preserved. No acute fracture. No primary bone lesion or focal pathologic process.The lateral masses of C1 are well aligned with C2. The odontoid is intact. Moderate degenerative changes of the atlantoaxial joint. Soft tissues and spinal canal: No prevertebral edema or soft tissue thickening. No visible canal hematoma. Disc  levels: Small disc bulge at C3-C4 causing minimal narrowing of the spinal canal. Biapical pleuroparenchymal scarring. Upper chest: No focal airspace consolidation or pleural effusion. Other: None IMPRESSION: No acute fracture or traumatic malalignment of the cervical spine. Electronically Signed   By: Rance Burrows M.D.   On: 09/02/2023 16:35   CT Head Wo Contrast Result Date: 09/02/2023 CLINICAL DATA:  Head trauma, minor (Age >= 65y) EXAM: CT HEAD WITHOUT CONTRAST TECHNIQUE: Contiguous axial images were obtained from the base of the skull through the vertex without intravenous contrast. RADIATION DOSE REDUCTION: This exam was performed according to the departmental dose-optimization program which includes automated exposure control, adjustment of the mA and/or kV according to patient size and/or use of iterative reconstruction technique. COMPARISON:  None Available. FINDINGS: Brain: Proportional prominence of the ventricles and sulci, consistent with diffuse cerebral parenchymal volume loss. The ventricles otherwise maintained midline position without midline shift. Large region of chronic infarct in the left frontal lobe/operculum. A small region of chronic infarct is also noted in the high right frontal convexity. Chronic lacunar infarct within the right cerebellar hemisphere. Scattered periventricular white matter hypoattenuation, most consistent with changes of mild chronic ischemic microvascular disease. No evidence of acute territorial infarction, extra-axial fluid collection, hemorrhage, or mass lesion. The basilar cisterns are patent without downward herniation. The cerebellar hemispheres and vermis are well formed without mass lesion or focal attenuation abnormality. Vascular: No hyperdense vessel. Calcified atherosclerotic plaque within the cavernous/supraclinoid ICA and intradural vertebral arteries. Skull: Normal. Negative for fracture or focal lesion. Sinuses/Orbits: The paranasal sinuses and  mastoids are clear. The globes appear intact with postsurgical changes again noted. No retrobulbar hematoma. Other: None. IMPRESSION: 1. No acute intracranial abnormality, specifically, no acute hemorrhage, territorial infarction, or intracranial mass. 2. Chronic infarcts within the frontal lobes bilaterally, larger on the left than the right, as described above. 3. Cerebral volume loss with sequelae of chronic ischemic microvascular disease. Electronically Signed   By: Rance Burrows M.D.   On: 09/02/2023 16:29    Procedures Procedures  {Document cardiac monitor, telemetry assessment procedure when appropriate:1}  Medications Ordered in ED Medications - No data to display  ED Course/ Medical Decision Making/ A&P                          Medical Decision Making Amount and/or Complexity of Data Reviewed Labs: ordered. Decision-making details documented in ED Course. Radiology:  ordered. Decision-making details documented in ED Course.    This patient presents to the ED for concern of fall, neck pain, , this involves an extensive number of treatment options, and is a complaint that carries with it a high risk of complications and morbidity.  I considered the following differential and admission for this acute, potentially life threatening condition.   MDM:    DDX for trauma includes but is not limited to:  -Head Injury such as skull fx or ICH -Airway compromise/injury -Chest Injury and Abdominal Injury - including hemo/pneumothorax, cardiac, abdominal solid and hollow organ injury -Spinal Cord or Vertebral injury -Fractures  EMS called code stroke but I canceled the code stroke.  Unknown last known well and patient does not note any specific neurodeficits.  She does have a history of an old right sided stroke and has left sided weakness at baseline and states that it feels normal to her.  She personally does not note any differences in speech.  ***   Clinical Course as of 09/06/23  1954  Thu Sep 02, 2023  1417 Glucose-Capillary(!): 150 [HN]  1630 WBC(!): 14.6 +leukocytosis w/ left shift [HN]  1630 Alcohol, Ethyl (B): <15 neg [HN]  1640 CT Head Wo Contrast 1. No acute intracranial abnormality, specifically, no acute hemorrhage, territorial infarction, or intracranial mass. 2. Chronic infarcts within the frontal lobes bilaterally, larger on the left than the right, as described above. 3. Cerebral volume loss with sequelae of chronic ischemic microvascular disease.   [HN]  1640 CT Cervical Spine Wo Contrast No acute fracture or traumatic malalignment of the cervical spine. [HN]  1811 Patient's neighbor at bedside states that she is searching for words more than normal.  [HN]  1936 Urinalysis, Routine w reflex microscopic -Urine, Clean Catch(!) No UTI [HN]  1945 MR BRAIN WO CONTRAST 1. No acute intracranial abnormality. 2. Chronic bilateral frontal lobe infarcts, left larger than right, with a few additional tiny remote cerebellar infarcts. 3. Underlying mild chronic microvascular ischemic disease for age.   [HN]  1946 Patient states she feels well and is ready to be discharged. She denies any pain anywhere. She thinks her speech is at baseline. DC w/ discharge instructions/return precautions. All questions answered to patient's satisfaction.   [HN]    Clinical Course User Index [HN] Merdis Stalling, MD    Labs: I Ordered, and personally interpreted labs.  The pertinent results include:  those listed above  Imaging Studies ordered: I ordered imaging studies including CTH, CT C-spine I independently visualized and interpreted imaging. I agree with the radiologist interpretation  Additional history obtained from chart review, EMS.   Cardiac Monitoring: .The patient was maintained on a cardiac monitor.  I personally viewed and interpreted the cardiac monitored which showed an underlying rhythm of: NSR  Reevaluation: After the interventions noted above,  I reevaluated the patient and found that they have :stayed the same  Social Determinants of Health: .Lives at facility  Disposition:  ***  Co morbidities that complicate the patient evaluation . Past Medical History:  Diagnosis Date  . Aneurysm, descending thoracic aorta    Proximal (1.5 cm)  . Arthritis    right knee  . Cystic kidney disease   . DVT (deep venous thrombosis) (HCC)   . Dyslipidemia   . History of kidney stones   . HTN (hypertension)   . Personal history of kidney stones   . Stroke [facility] 924/21     Medicines No orders of the defined types  were placed in this encounter.   I have reviewed the patients home medicines and have made adjustments as needed  Problem List / ED Course: Problem List Items Addressed This Visit   None        {Document critical care time when appropriate:1} {Document review of labs and clinical decision tools ie heart score, Chads2Vasc2 etc:1}  {Document your independent review of radiology images, and any outside records:1} {Document your discussion with family members, caretakers, and with consultants:1} {Document social determinants of health affecting pt's care:1} {Document your decision making why or why not admission, treatments were needed:1}  This note was created using dictation software, which may contain spelling or grammatical errors.

## 2023-09-02 NOTE — ED Notes (Signed)
 Patient gave water .  Patient able to sit on edge of bed and stand to get into wheel chair.  IV removed.  Went over [employer] with neighbor. Verbalized understanding Assisted into vehicle.

## 2023-09-06 ENCOUNTER — Telehealth (HOSPITAL_COMMUNITY): Payer: Self-pay | Admitting: Emergency Medicine

## 2023-09-17 DIAGNOSIS — H401122 Primary open-angle glaucoma, left eye, moderate stage: Secondary | ICD-10-CM | POA: Diagnosis not present

## 2023-09-17 DIAGNOSIS — Z961 Presence of intraocular lens: Secondary | ICD-10-CM | POA: Diagnosis not present

## 2023-09-17 DIAGNOSIS — H401113 Primary open-angle glaucoma, right eye, severe stage: Secondary | ICD-10-CM | POA: Diagnosis not present

## 2023-09-17 DIAGNOSIS — H04123 Dry eye syndrome of bilateral lacrimal glands: Secondary | ICD-10-CM | POA: Diagnosis not present

## 2023-09-21 DIAGNOSIS — U071 COVID-19: Secondary | ICD-10-CM | POA: Diagnosis not present

## 2023-10-18 DIAGNOSIS — I10 Essential (primary) hypertension: Secondary | ICD-10-CM | POA: Diagnosis not present

## 2023-10-18 DIAGNOSIS — R296 Repeated falls: Secondary | ICD-10-CM | POA: Diagnosis not present

## 2023-10-18 DIAGNOSIS — E441 Mild protein-calorie malnutrition: Secondary | ICD-10-CM | POA: Diagnosis not present

## 2023-10-19 DIAGNOSIS — E441 Mild protein-calorie malnutrition: Secondary | ICD-10-CM | POA: Diagnosis not present

## 2023-10-19 DIAGNOSIS — Z7982 Long term (current) use of aspirin: Secondary | ICD-10-CM | POA: Diagnosis not present

## 2023-10-19 DIAGNOSIS — H9190 Unspecified hearing loss, unspecified ear: Secondary | ICD-10-CM | POA: Diagnosis not present

## 2023-10-19 DIAGNOSIS — Z8673 Personal history of transient ischemic attack (TIA), and cerebral infarction without residual deficits: Secondary | ICD-10-CM | POA: Diagnosis not present

## 2023-10-19 DIAGNOSIS — Z9181 History of falling: Secondary | ICD-10-CM | POA: Diagnosis not present

## 2023-10-19 DIAGNOSIS — H401110 Primary open-angle glaucoma, right eye, stage unspecified: Secondary | ICD-10-CM | POA: Diagnosis not present

## 2023-10-19 DIAGNOSIS — I10 Essential (primary) hypertension: Secondary | ICD-10-CM | POA: Diagnosis not present

## 2023-10-19 DIAGNOSIS — Z556 Problems related to health literacy: Secondary | ICD-10-CM | POA: Diagnosis not present

## 2023-10-19 DIAGNOSIS — M80051D Age-related osteoporosis with current pathological fracture, right femur, subsequent encounter for fracture with routine healing: Secondary | ICD-10-CM | POA: Diagnosis not present

## 2023-10-21 DIAGNOSIS — Z7982 Long term (current) use of aspirin: Secondary | ICD-10-CM | POA: Diagnosis not present

## 2023-10-21 DIAGNOSIS — Z556 Problems related to health literacy: Secondary | ICD-10-CM | POA: Diagnosis not present

## 2023-10-21 DIAGNOSIS — M80051D Age-related osteoporosis with current pathological fracture, right femur, subsequent encounter for fracture with routine healing: Secondary | ICD-10-CM | POA: Diagnosis not present

## 2023-10-21 DIAGNOSIS — I10 Essential (primary) hypertension: Secondary | ICD-10-CM | POA: Diagnosis not present

## 2023-10-21 DIAGNOSIS — Z9181 History of falling: Secondary | ICD-10-CM | POA: Diagnosis not present

## 2023-10-21 DIAGNOSIS — H401110 Primary open-angle glaucoma, right eye, stage unspecified: Secondary | ICD-10-CM | POA: Diagnosis not present

## 2023-10-21 DIAGNOSIS — Z8673 Personal history of transient ischemic attack (TIA), and cerebral infarction without residual deficits: Secondary | ICD-10-CM | POA: Diagnosis not present

## 2023-10-21 DIAGNOSIS — H9190 Unspecified hearing loss, unspecified ear: Secondary | ICD-10-CM | POA: Diagnosis not present

## 2023-10-21 DIAGNOSIS — E441 Mild protein-calorie malnutrition: Secondary | ICD-10-CM | POA: Diagnosis not present

## 2023-10-27 DIAGNOSIS — I10 Essential (primary) hypertension: Secondary | ICD-10-CM | POA: Diagnosis not present

## 2023-10-27 DIAGNOSIS — E441 Mild protein-calorie malnutrition: Secondary | ICD-10-CM | POA: Diagnosis not present

## 2023-10-27 DIAGNOSIS — Z7982 Long term (current) use of aspirin: Secondary | ICD-10-CM | POA: Diagnosis not present

## 2023-10-27 DIAGNOSIS — H9190 Unspecified hearing loss, unspecified ear: Secondary | ICD-10-CM | POA: Diagnosis not present

## 2023-10-27 DIAGNOSIS — Z8673 Personal history of transient ischemic attack (TIA), and cerebral infarction without residual deficits: Secondary | ICD-10-CM | POA: Diagnosis not present

## 2023-10-27 DIAGNOSIS — M80051D Age-related osteoporosis with current pathological fracture, right femur, subsequent encounter for fracture with routine healing: Secondary | ICD-10-CM | POA: Diagnosis not present

## 2023-10-27 DIAGNOSIS — Z9181 History of falling: Secondary | ICD-10-CM | POA: Diagnosis not present

## 2023-10-27 DIAGNOSIS — H401110 Primary open-angle glaucoma, right eye, stage unspecified: Secondary | ICD-10-CM | POA: Diagnosis not present

## 2023-10-27 DIAGNOSIS — Z556 Problems related to health literacy: Secondary | ICD-10-CM | POA: Diagnosis not present

## 2023-10-29 DIAGNOSIS — I10 Essential (primary) hypertension: Secondary | ICD-10-CM | POA: Diagnosis not present

## 2023-10-29 DIAGNOSIS — M80051D Age-related osteoporosis with current pathological fracture, right femur, subsequent encounter for fracture with routine healing: Secondary | ICD-10-CM | POA: Diagnosis not present

## 2023-10-29 DIAGNOSIS — Z7982 Long term (current) use of aspirin: Secondary | ICD-10-CM | POA: Diagnosis not present

## 2023-10-29 DIAGNOSIS — Z556 Problems related to health literacy: Secondary | ICD-10-CM | POA: Diagnosis not present

## 2023-10-29 DIAGNOSIS — Z9181 History of falling: Secondary | ICD-10-CM | POA: Diagnosis not present

## 2023-10-29 DIAGNOSIS — H401110 Primary open-angle glaucoma, right eye, stage unspecified: Secondary | ICD-10-CM | POA: Diagnosis not present

## 2023-10-29 DIAGNOSIS — Z8673 Personal history of transient ischemic attack (TIA), and cerebral infarction without residual deficits: Secondary | ICD-10-CM | POA: Diagnosis not present

## 2023-10-29 DIAGNOSIS — H9190 Unspecified hearing loss, unspecified ear: Secondary | ICD-10-CM | POA: Diagnosis not present

## 2023-10-29 DIAGNOSIS — E441 Mild protein-calorie malnutrition: Secondary | ICD-10-CM | POA: Diagnosis not present

## 2023-11-02 DIAGNOSIS — H9190 Unspecified hearing loss, unspecified ear: Secondary | ICD-10-CM | POA: Diagnosis not present

## 2023-11-02 DIAGNOSIS — I10 Essential (primary) hypertension: Secondary | ICD-10-CM | POA: Diagnosis not present

## 2023-11-02 DIAGNOSIS — Z556 Problems related to health literacy: Secondary | ICD-10-CM | POA: Diagnosis not present

## 2023-11-02 DIAGNOSIS — Z8673 Personal history of transient ischemic attack (TIA), and cerebral infarction without residual deficits: Secondary | ICD-10-CM | POA: Diagnosis not present

## 2023-11-02 DIAGNOSIS — Z7982 Long term (current) use of aspirin: Secondary | ICD-10-CM | POA: Diagnosis not present

## 2023-11-02 DIAGNOSIS — M80051D Age-related osteoporosis with current pathological fracture, right femur, subsequent encounter for fracture with routine healing: Secondary | ICD-10-CM | POA: Diagnosis not present

## 2023-11-02 DIAGNOSIS — Z9181 History of falling: Secondary | ICD-10-CM | POA: Diagnosis not present

## 2023-11-02 DIAGNOSIS — E441 Mild protein-calorie malnutrition: Secondary | ICD-10-CM | POA: Diagnosis not present

## 2023-11-02 DIAGNOSIS — H401110 Primary open-angle glaucoma, right eye, stage unspecified: Secondary | ICD-10-CM | POA: Diagnosis not present

## 2023-11-12 DIAGNOSIS — Z7982 Long term (current) use of aspirin: Secondary | ICD-10-CM | POA: Diagnosis not present

## 2023-11-12 DIAGNOSIS — I10 Essential (primary) hypertension: Secondary | ICD-10-CM | POA: Diagnosis not present

## 2023-11-12 DIAGNOSIS — E441 Mild protein-calorie malnutrition: Secondary | ICD-10-CM | POA: Diagnosis not present

## 2023-11-12 DIAGNOSIS — Z8673 Personal history of transient ischemic attack (TIA), and cerebral infarction without residual deficits: Secondary | ICD-10-CM | POA: Diagnosis not present

## 2023-11-12 DIAGNOSIS — Z556 Problems related to health literacy: Secondary | ICD-10-CM | POA: Diagnosis not present

## 2023-11-12 DIAGNOSIS — H401110 Primary open-angle glaucoma, right eye, stage unspecified: Secondary | ICD-10-CM | POA: Diagnosis not present

## 2023-11-12 DIAGNOSIS — Z9181 History of falling: Secondary | ICD-10-CM | POA: Diagnosis not present

## 2023-11-12 DIAGNOSIS — M80051D Age-related osteoporosis with current pathological fracture, right femur, subsequent encounter for fracture with routine healing: Secondary | ICD-10-CM | POA: Diagnosis not present

## 2023-11-12 DIAGNOSIS — H9190 Unspecified hearing loss, unspecified ear: Secondary | ICD-10-CM | POA: Diagnosis not present

## 2023-11-15 DIAGNOSIS — H9190 Unspecified hearing loss, unspecified ear: Secondary | ICD-10-CM | POA: Diagnosis not present

## 2023-11-15 DIAGNOSIS — Z9181 History of falling: Secondary | ICD-10-CM | POA: Diagnosis not present

## 2023-11-15 DIAGNOSIS — E441 Mild protein-calorie malnutrition: Secondary | ICD-10-CM | POA: Diagnosis not present

## 2023-11-15 DIAGNOSIS — Z8673 Personal history of transient ischemic attack (TIA), and cerebral infarction without residual deficits: Secondary | ICD-10-CM | POA: Diagnosis not present

## 2023-11-15 DIAGNOSIS — M80051D Age-related osteoporosis with current pathological fracture, right femur, subsequent encounter for fracture with routine healing: Secondary | ICD-10-CM | POA: Diagnosis not present

## 2023-11-15 DIAGNOSIS — Z7982 Long term (current) use of aspirin: Secondary | ICD-10-CM | POA: Diagnosis not present

## 2023-11-15 DIAGNOSIS — I10 Essential (primary) hypertension: Secondary | ICD-10-CM | POA: Diagnosis not present

## 2023-11-15 DIAGNOSIS — Z556 Problems related to health literacy: Secondary | ICD-10-CM | POA: Diagnosis not present

## 2023-11-15 DIAGNOSIS — H401110 Primary open-angle glaucoma, right eye, stage unspecified: Secondary | ICD-10-CM | POA: Diagnosis not present

## 2023-11-25 DIAGNOSIS — M80051D Age-related osteoporosis with current pathological fracture, right femur, subsequent encounter for fracture with routine healing: Secondary | ICD-10-CM | POA: Diagnosis not present

## 2023-11-25 DIAGNOSIS — I10 Essential (primary) hypertension: Secondary | ICD-10-CM | POA: Diagnosis not present

## 2023-11-25 DIAGNOSIS — E441 Mild protein-calorie malnutrition: Secondary | ICD-10-CM | POA: Diagnosis not present

## 2023-11-25 DIAGNOSIS — Z7982 Long term (current) use of aspirin: Secondary | ICD-10-CM | POA: Diagnosis not present

## 2023-11-25 DIAGNOSIS — Z556 Problems related to health literacy: Secondary | ICD-10-CM | POA: Diagnosis not present

## 2023-11-25 DIAGNOSIS — Z8673 Personal history of transient ischemic attack (TIA), and cerebral infarction without residual deficits: Secondary | ICD-10-CM | POA: Diagnosis not present

## 2023-11-25 DIAGNOSIS — H401110 Primary open-angle glaucoma, right eye, stage unspecified: Secondary | ICD-10-CM | POA: Diagnosis not present

## 2023-11-25 DIAGNOSIS — H9190 Unspecified hearing loss, unspecified ear: Secondary | ICD-10-CM | POA: Diagnosis not present

## 2023-11-25 DIAGNOSIS — Z9181 History of falling: Secondary | ICD-10-CM | POA: Diagnosis not present

## 2023-12-02 DIAGNOSIS — M80051D Age-related osteoporosis with current pathological fracture, right femur, subsequent encounter for fracture with routine healing: Secondary | ICD-10-CM | POA: Diagnosis not present

## 2023-12-02 DIAGNOSIS — H9190 Unspecified hearing loss, unspecified ear: Secondary | ICD-10-CM | POA: Diagnosis not present

## 2023-12-02 DIAGNOSIS — Z7982 Long term (current) use of aspirin: Secondary | ICD-10-CM | POA: Diagnosis not present

## 2023-12-02 DIAGNOSIS — Z8673 Personal history of transient ischemic attack (TIA), and cerebral infarction without residual deficits: Secondary | ICD-10-CM | POA: Diagnosis not present

## 2023-12-02 DIAGNOSIS — H401110 Primary open-angle glaucoma, right eye, stage unspecified: Secondary | ICD-10-CM | POA: Diagnosis not present

## 2023-12-02 DIAGNOSIS — I10 Essential (primary) hypertension: Secondary | ICD-10-CM | POA: Diagnosis not present

## 2023-12-02 DIAGNOSIS — Z9181 History of falling: Secondary | ICD-10-CM | POA: Diagnosis not present

## 2023-12-02 DIAGNOSIS — Z556 Problems related to health literacy: Secondary | ICD-10-CM | POA: Diagnosis not present

## 2023-12-02 DIAGNOSIS — E441 Mild protein-calorie malnutrition: Secondary | ICD-10-CM | POA: Diagnosis not present

## 2023-12-09 DIAGNOSIS — Z8673 Personal history of transient ischemic attack (TIA), and cerebral infarction without residual deficits: Secondary | ICD-10-CM | POA: Diagnosis not present

## 2023-12-09 DIAGNOSIS — E441 Mild protein-calorie malnutrition: Secondary | ICD-10-CM | POA: Diagnosis not present

## 2023-12-09 DIAGNOSIS — Z7982 Long term (current) use of aspirin: Secondary | ICD-10-CM | POA: Diagnosis not present

## 2023-12-09 DIAGNOSIS — H401110 Primary open-angle glaucoma, right eye, stage unspecified: Secondary | ICD-10-CM | POA: Diagnosis not present

## 2023-12-09 DIAGNOSIS — Z556 Problems related to health literacy: Secondary | ICD-10-CM | POA: Diagnosis not present

## 2023-12-09 DIAGNOSIS — H9190 Unspecified hearing loss, unspecified ear: Secondary | ICD-10-CM | POA: Diagnosis not present

## 2023-12-09 DIAGNOSIS — M80051D Age-related osteoporosis with current pathological fracture, right femur, subsequent encounter for fracture with routine healing: Secondary | ICD-10-CM | POA: Diagnosis not present

## 2023-12-09 DIAGNOSIS — I10 Essential (primary) hypertension: Secondary | ICD-10-CM | POA: Diagnosis not present

## 2023-12-09 DIAGNOSIS — Z9181 History of falling: Secondary | ICD-10-CM | POA: Diagnosis not present

## 2023-12-16 DIAGNOSIS — Z8673 Personal history of transient ischemic attack (TIA), and cerebral infarction without residual deficits: Secondary | ICD-10-CM | POA: Diagnosis not present

## 2023-12-16 DIAGNOSIS — Z7982 Long term (current) use of aspirin: Secondary | ICD-10-CM | POA: Diagnosis not present

## 2023-12-16 DIAGNOSIS — H9190 Unspecified hearing loss, unspecified ear: Secondary | ICD-10-CM | POA: Diagnosis not present

## 2023-12-16 DIAGNOSIS — I10 Essential (primary) hypertension: Secondary | ICD-10-CM | POA: Diagnosis not present

## 2023-12-16 DIAGNOSIS — E441 Mild protein-calorie malnutrition: Secondary | ICD-10-CM | POA: Diagnosis not present

## 2023-12-16 DIAGNOSIS — Z556 Problems related to health literacy: Secondary | ICD-10-CM | POA: Diagnosis not present

## 2023-12-16 DIAGNOSIS — M80051D Age-related osteoporosis with current pathological fracture, right femur, subsequent encounter for fracture with routine healing: Secondary | ICD-10-CM | POA: Diagnosis not present

## 2023-12-16 DIAGNOSIS — H401110 Primary open-angle glaucoma, right eye, stage unspecified: Secondary | ICD-10-CM | POA: Diagnosis not present

## 2023-12-16 DIAGNOSIS — Z9181 History of falling: Secondary | ICD-10-CM | POA: Diagnosis not present

## 2023-12-18 DIAGNOSIS — E441 Mild protein-calorie malnutrition: Secondary | ICD-10-CM | POA: Diagnosis not present

## 2023-12-18 DIAGNOSIS — M80051D Age-related osteoporosis with current pathological fracture, right femur, subsequent encounter for fracture with routine healing: Secondary | ICD-10-CM | POA: Diagnosis not present

## 2023-12-18 DIAGNOSIS — H9190 Unspecified hearing loss, unspecified ear: Secondary | ICD-10-CM | POA: Diagnosis not present

## 2023-12-18 DIAGNOSIS — I10 Essential (primary) hypertension: Secondary | ICD-10-CM | POA: Diagnosis not present

## 2023-12-23 DIAGNOSIS — I10 Essential (primary) hypertension: Secondary | ICD-10-CM | POA: Diagnosis not present

## 2023-12-23 DIAGNOSIS — Z556 Problems related to health literacy: Secondary | ICD-10-CM | POA: Diagnosis not present

## 2023-12-23 DIAGNOSIS — M1990 Unspecified osteoarthritis, unspecified site: Secondary | ICD-10-CM | POA: Diagnosis not present

## 2023-12-23 DIAGNOSIS — Z7982 Long term (current) use of aspirin: Secondary | ICD-10-CM | POA: Diagnosis not present

## 2023-12-23 DIAGNOSIS — Z9181 History of falling: Secondary | ICD-10-CM | POA: Diagnosis not present

## 2023-12-23 DIAGNOSIS — E441 Mild protein-calorie malnutrition: Secondary | ICD-10-CM | POA: Diagnosis not present

## 2023-12-23 DIAGNOSIS — H9190 Unspecified hearing loss, unspecified ear: Secondary | ICD-10-CM | POA: Diagnosis not present

## 2023-12-23 DIAGNOSIS — Z8673 Personal history of transient ischemic attack (TIA), and cerebral infarction without residual deficits: Secondary | ICD-10-CM | POA: Diagnosis not present

## 2023-12-23 DIAGNOSIS — M80051D Age-related osteoporosis with current pathological fracture, right femur, subsequent encounter for fracture with routine healing: Secondary | ICD-10-CM | POA: Diagnosis not present

## 2023-12-23 DIAGNOSIS — H401110 Primary open-angle glaucoma, right eye, stage unspecified: Secondary | ICD-10-CM | POA: Diagnosis not present

## 2023-12-28 ENCOUNTER — Other Ambulatory Visit: Payer: Self-pay

## 2023-12-28 ENCOUNTER — Emergency Department (HOSPITAL_COMMUNITY)
Admission: EM | Admit: 2023-12-28 | Discharge: 2023-12-31 | Disposition: A | Discharge status: Home / Self Care | Attending: Emergency Medicine | Admitting: Emergency Medicine

## 2023-12-28 ENCOUNTER — Encounter (HOSPITAL_COMMUNITY): Payer: Self-pay | Admitting: Emergency Medicine

## 2023-12-28 DIAGNOSIS — Z7982 Long term (current) use of aspirin: Secondary | ICD-10-CM | POA: Diagnosis not present

## 2023-12-28 DIAGNOSIS — G44309 Post-traumatic headache, unspecified, not intractable: Secondary | ICD-10-CM | POA: Diagnosis not present

## 2023-12-28 DIAGNOSIS — G4489 Other headache syndrome: Secondary | ICD-10-CM | POA: Diagnosis not present

## 2023-12-28 DIAGNOSIS — Z96641 Presence of right artificial hip joint: Secondary | ICD-10-CM | POA: Diagnosis not present

## 2023-12-28 DIAGNOSIS — Z79899 Other long term (current) drug therapy: Secondary | ICD-10-CM | POA: Insufficient documentation

## 2023-12-28 DIAGNOSIS — R262 Difficulty in walking, not elsewhere classified: Secondary | ICD-10-CM | POA: Insufficient documentation

## 2023-12-28 DIAGNOSIS — W19XXXA Unspecified fall, initial encounter: Secondary | ICD-10-CM | POA: Diagnosis not present

## 2023-12-28 DIAGNOSIS — M25551 Pain in right hip: Secondary | ICD-10-CM | POA: Diagnosis not present

## 2023-12-28 DIAGNOSIS — R799 Abnormal finding of blood chemistry, unspecified: Secondary | ICD-10-CM | POA: Insufficient documentation

## 2023-12-28 DIAGNOSIS — I10 Essential (primary) hypertension: Secondary | ICD-10-CM | POA: Diagnosis not present

## 2023-12-28 DIAGNOSIS — I6782 Cerebral ischemia: Secondary | ICD-10-CM | POA: Diagnosis not present

## 2023-12-28 DIAGNOSIS — M6281 Muscle weakness (generalized): Secondary | ICD-10-CM | POA: Diagnosis not present

## 2023-12-28 DIAGNOSIS — R7309 Other abnormal glucose: Secondary | ICD-10-CM | POA: Diagnosis not present

## 2023-12-28 DIAGNOSIS — Y92009 Unspecified place in unspecified non-institutional (private) residence as the place of occurrence of the external cause: Secondary | ICD-10-CM | POA: Insufficient documentation

## 2023-12-28 DIAGNOSIS — M542 Cervicalgia: Secondary | ICD-10-CM | POA: Diagnosis not present

## 2023-12-28 DIAGNOSIS — Z471 Aftercare following joint replacement surgery: Secondary | ICD-10-CM | POA: Diagnosis not present

## 2023-12-28 DIAGNOSIS — R531 Weakness: Secondary | ICD-10-CM | POA: Diagnosis not present

## 2023-12-28 DIAGNOSIS — R739 Hyperglycemia, unspecified: Secondary | ICD-10-CM

## 2023-12-28 DIAGNOSIS — Z743 Need for continuous supervision: Secondary | ICD-10-CM | POA: Diagnosis not present

## 2023-12-28 DIAGNOSIS — M25552 Pain in left hip: Secondary | ICD-10-CM | POA: Diagnosis not present

## 2023-12-28 NOTE — ED Triage Notes (Signed)
 Ems called out for fall. Pt was on the floor for about 2 hours. Pt is now having trouble ambulating. She usually walks with the walker.

## 2023-12-28 NOTE — ED Notes (Signed)
 Tracey Holmes [phone] wants to notified and updated about pt once decision as been made.

## 2023-12-29 ENCOUNTER — Emergency Department (HOSPITAL_COMMUNITY)

## 2023-12-29 DIAGNOSIS — M25551 Pain in right hip: Secondary | ICD-10-CM | POA: Diagnosis not present

## 2023-12-29 DIAGNOSIS — M25552 Pain in left hip: Secondary | ICD-10-CM | POA: Diagnosis not present

## 2023-12-29 DIAGNOSIS — M542 Cervicalgia: Secondary | ICD-10-CM | POA: Diagnosis not present

## 2023-12-29 DIAGNOSIS — G44309 Post-traumatic headache, unspecified, not intractable: Secondary | ICD-10-CM | POA: Diagnosis not present

## 2023-12-29 DIAGNOSIS — I6782 Cerebral ischemia: Secondary | ICD-10-CM | POA: Diagnosis not present

## 2023-12-29 DIAGNOSIS — Z471 Aftercare following joint replacement surgery: Secondary | ICD-10-CM | POA: Diagnosis not present

## 2023-12-29 DIAGNOSIS — Z96641 Presence of right artificial hip joint: Secondary | ICD-10-CM | POA: Diagnosis not present

## 2023-12-29 LAB — COMPREHENSIVE METABOLIC PANEL WITH GFR
ALT: 24 U/L (ref 0–44)
AST: 29 U/L (ref 15–41)
Albumin: 3.6 g/dL (ref 3.5–5.0)
Alkaline Phosphatase: 46 U/L (ref 38–126)
Anion gap: 11 (ref 5–15)
BUN: 29 mg/dL — ABNORMAL HIGH (ref 8–23)
CO2: 21 mmol/L — ABNORMAL LOW (ref 22–32)
Calcium: 9.3 mg/dL (ref 8.9–10.3)
Chloride: 107 mmol/L (ref 98–111)
Creatinine, Ser: 0.93 mg/dL (ref 0.44–1.00)
GFR, Estimated: 59 mL/min — ABNORMAL LOW (ref >60)
Glucose, Bld: 155 mg/dL — ABNORMAL HIGH (ref 70–99)
Potassium: 4.2 mmol/L (ref 3.5–5.1)
Sodium: 139 mmol/L (ref 135–145)
Total Bilirubin: 0.6 mg/dL (ref 0.0–1.2)
Total Protein: 6.5 g/dL (ref 6.5–8.1)

## 2023-12-29 LAB — URINALYSIS, ROUTINE W REFLEX MICROSCOPIC
Bilirubin Urine: NEGATIVE
Glucose, UA: NEGATIVE mg/dL
Hgb urine dipstick: NEGATIVE
Ketones, ur: NEGATIVE mg/dL
Leukocytes,Ua: NEGATIVE
Nitrite: NEGATIVE
Protein, ur: NEGATIVE mg/dL
Specific Gravity, Urine: 1.017 (ref 1.005–1.030)
pH: 6 (ref 5.0–8.0)

## 2023-12-29 LAB — CBC WITH DIFFERENTIAL/PLATELET
Abs Immature Granulocytes: 0.07 K/uL (ref 0.00–0.07)
Basophils Absolute: 0.1 K/uL (ref 0.0–0.1)
Basophils Relative: 1 %
Eosinophils Absolute: 0.1 K/uL (ref 0.0–0.5)
Eosinophils Relative: 0 %
HCT: 43.9 % (ref 36.0–46.0)
Hemoglobin: 14.5 g/dL (ref 12.0–15.0)
Immature Granulocytes: 1 %
Lymphocytes Relative: 6 %
Lymphs Abs: 0.9 K/uL (ref 0.7–4.0)
MCH: 32.3 pg (ref 26.0–34.0)
MCHC: 33 g/dL (ref 30.0–36.0)
MCV: 97.8 fL (ref 80.0–100.0)
Monocytes Absolute: 1.4 K/uL — ABNORMAL HIGH (ref 0.1–1.0)
Monocytes Relative: 9 %
Neutro Abs: 12.1 K/uL — ABNORMAL HIGH (ref 1.7–7.7)
Neutrophils Relative %: 83 %
Platelets: 257 K/uL (ref 150–400)
RBC: 4.49 MIL/uL (ref 3.87–5.11)
RDW: 12.8 % (ref 11.5–15.5)
WBC: 14.6 K/uL — ABNORMAL HIGH (ref 4.0–10.5)
nRBC: 0 % (ref 0.0–0.2)

## 2023-12-29 LAB — CK: Total CK: 106 U/L (ref 38–234)

## 2023-12-29 MED ORDER — DORZOLAMIDE HCL-TIMOLOL MAL 2-0.5 % OP SOLN
1.0000 [drp] | Freq: Two times a day (BID) | OPHTHALMIC | Status: DC
  Administered 2023-12-29 – 2023-12-31 (×5): 1 [drp] via OPHTHALMIC
  Filled 2023-12-29: qty 10

## 2023-12-29 MED ORDER — MIRTAZAPINE 15 MG PO TABS
15.0000 mg | ORAL_TABLET | Freq: Every day | ORAL | Status: DC
  Administered 2023-12-29 – 2023-12-30 (×2): 15 mg via ORAL
  Filled 2023-12-29 (×2): qty 1

## 2023-12-29 MED ORDER — VITAMIN D 25 MCG (1000 UNIT) PO TABS
2000.0000 [IU] | ORAL_TABLET | Freq: Every day | ORAL | Status: DC
  Administered 2023-12-29 – 2023-12-31 (×3): 2000 [IU] via ORAL
  Filled 2023-12-29 (×3): qty 2

## 2023-12-29 MED ORDER — AMLODIPINE BESYLATE 5 MG PO TABS
5.0000 mg | ORAL_TABLET | Freq: Every morning | ORAL | Status: DC
  Administered 2023-12-29 – 2023-12-31 (×3): 5 mg via ORAL
  Filled 2023-12-29 (×3): qty 1

## 2023-12-29 MED ORDER — ACETAMINOPHEN 325 MG PO TABS
650.0000 mg | ORAL_TABLET | Freq: Four times a day (QID) | ORAL | Status: DC | PRN
  Administered 2023-12-31: 650 mg via ORAL
  Filled 2023-12-29: qty 2

## 2023-12-29 MED ORDER — LORATADINE 10 MG PO TABS
10.0000 mg | ORAL_TABLET | Freq: Every day | ORAL | Status: DC
  Administered 2023-12-29 – 2023-12-31 (×3): 10 mg via ORAL
  Filled 2023-12-29 (×3): qty 1

## 2023-12-29 MED ORDER — NETARSUDIL-LATANOPROST 0.02-0.005 % OP SOLN: 1.0000 [drp] | Freq: Every day | OPHTHALMIC | Status: DC

## 2023-12-29 MED ORDER — ASPIRIN 81 MG PO TBEC
81.0000 mg | DELAYED_RELEASE_TABLET | Freq: Every day | ORAL | Status: DC
  Administered 2023-12-29 – 2023-12-31 (×3): 81 mg via ORAL
  Filled 2023-12-29 (×3): qty 1

## 2023-12-29 MED ORDER — NIACIN 500 MG PO TABS
250.0000 mg | ORAL_TABLET | Freq: Every morning | ORAL | Status: DC
  Administered 2023-12-30: 250 mg via ORAL
  Filled 2023-12-29 (×3): qty 1

## 2023-12-29 MED ORDER — ADULT MULTIVITAMIN W/MINERALS CH
1.0000 | ORAL_TABLET | Freq: Every morning | ORAL | Status: DC
  Administered 2023-12-30 – 2023-12-31 (×2): 1 via ORAL
  Filled 2023-12-29 (×3): qty 1

## 2023-12-29 MED ORDER — BRIMONIDINE TARTRATE 0.2 % OP SOLN
1.0000 [drp] | Freq: Three times a day (TID) | OPHTHALMIC | Status: DC
  Administered 2023-12-29 – 2023-12-31 (×7): 1 [drp] via OPHTHALMIC
  Filled 2023-12-29: qty 5

## 2023-12-29 NOTE — ED Notes (Signed)
 Pt not able to ambulate with walker. Not able to take a individual step or get legs straight enough to bear weight on them.

## 2023-12-29 NOTE — NC FL2 (Signed)
 [place]  MEDICAID FL2 LEVEL OF CARE FORM     IDENTIFICATION  Patient Name: Tracey Holmes Birthdate: 1933/05/26 Sex: female Admission Date (Current Location): 12/28/2023  [facility] and [place] Number:  [employer] and Address:  [facility],  [address] [place] [address]      Provider Number: [id number]  Attending Physician Name and Address:  Yolande Lamar BROCKS, MD  Relative Name and Phone Number:  Wellington Haley ( niece ) [phone] or Yeily Link [phone]    Current Level of Care: Hospital Recommended Level of Care: Skilled Nursing Facility Prior Approval Number:    Date Approved/Denied:   PASRR Number:    Discharge Plan: SNF    Current Diagnoses: Patient Active Problem List   Diagnosis Date Noted   Aortic root dilation (HCC) 06/18/2022   Lung nodule 09/04/2020   PFO (patent foramen ovale)    Mixed hyperlipidemia 05/16/2020   Aphasia    Embolic stroke involving cerebral artery (HCC)    Closed right hip fracture (HCC) 01/26/2020   Thoracic aortic aneurysm without rupture (HCC) 02/01/2019   HTN (hypertension)    Personal history of kidney stones    Cystic kidney disease    Dyslipidemia    Hyperlipidemia 11/17/2007   Essential hypertension 11/17/2007   LUNG NODULE 11/17/2007   RASH AND OTHER NONSPECIFIC SKIN ERUPTION 11/10/2007    Orientation RESPIRATION BLADDER Height & Weight     Self, Situation, Place  Normal Continent Weight: 110 lb 3.7 oz (50 kg) Height:  5' 2 (157.5 cm)  BEHAVIORAL SYMPTOMS/MOOD NEUROLOGICAL BOWEL NUTRITION STATUS      Continent Diet (regular)  AMBULATORY STATUS COMMUNICATION OF NEEDS Skin   Extensive Assist Verbally                         Personal Care Assistance Level of Assistance  Bathing, Feeding, Dressing Bathing Assistance: Maximum assistance Feeding assistance: Independent Dressing Assistance: Maximum assistance     Functional Limitations Info  Sight, Hearing, Speech Sight Info:  Impaired (eye glasses) Hearing Info: Impaired (Hard of hearing) Speech Info: Adequate    SPECIAL CARE FACTORS FREQUENCY  PT (By licensed PT), OT (By licensed OT)     PT Frequency: 5 x a week OT Frequency: 5 x a week            Contractures Contractures Info: Not present    Additional Factors Info  Code Status, Allergies, Psychotropic Code Status Info: FULL Allergies Info: Benicar, Diovan, Fosamax, and Penicillins Psychotropic Info: Mirtazapine          Current Medications (12/29/2023):  This is the current hospital active medication list Current Facility-Administered Medications  Medication Dose Route Frequency Provider Last Rate Last Admin   acetaminophen  (TYLENOL ) tablet 650 mg  650 mg Oral Q6H PRN Raford Lenis, MD       amLODipine  (NORVASC ) tablet 5 mg  5 mg Oral q AM Raford Lenis, MD       aspirin  EC tablet 81 mg  81 mg Oral Daily Raford Lenis, MD       brimonidine  (ALPHAGAN ) 0.2 % ophthalmic solution 1 drop  1 drop Both Eyes TID Raford Lenis, MD       cholecalciferol  (VITAMIN D3) 25 MCG (1000 UNIT) tablet 2,000 Units  2,000 Units Oral Daily Raford Lenis, MD       dorzolamide -timolol  (COSOPT ) 2-0.5 % ophthalmic solution 1 drop  1 drop Both Eyes BID Raford Lenis, MD  loratadine  (CLARITIN ) tablet 10 mg  10 mg Oral Daily Raford Lenis, MD       mirtazapine  (REMERON ) tablet 15 mg  15 mg Oral QHS Raford Lenis, MD       multivitamin with minerals tablet 1 tablet  1 tablet Oral q AM Raford Lenis, MD       Netarsudil -Latanoprost  0.02-0.005 % SOLN 1 drop  1 drop Both Eyes QHS Raford Lenis, MD       niacin  (VITAMIN B3) tablet 250 mg  250 mg Oral q AM Raford Lenis, MD       Current Outpatient Medications  Medication Sig Dispense Refill   pravastatin  (PRAVACHOL ) 80 MG tablet Take 80 mg by mouth daily.     acetaminophen  (TYLENOL ) 500 MG tablet Take 500 mg by mouth 2 (two) times daily as needed (pain.).     amLODipine  (NORVASC ) 5 MG tablet Take 5 mg by mouth in the morning.      aspirin  EC 81 MG tablet Take 81 mg by mouth in the morning and at bedtime.     brimonidine  (ALPHAGAN ) 0.2 % ophthalmic solution Place 1 drop into both eyes 3 (three) times daily.     cetirizine (ZYRTEC) 10 MG tablet Take 10 mg by mouth at bedtime.     Cholecalciferol  (VITAMIN D ) 2000 UNITS tablet Take 2,000 Units by mouth in the morning.     denosumab  (PROLIA ) 60 MG/ML SOLN injection Inject 60 mg into the skin every 6 (six) months. Administer in upper arm, thigh, or abdomen     dorzolamide -timolol  (COSOPT ) 22.3-6.8 MG/ML ophthalmic solution Place 1 drop into both eyes 2 (two) times daily.     lactose free nutrition (BOOST) LIQD Take 237 mLs by mouth every other day.     mirtazapine  (REMERON ) 15 MG tablet Take 15 mg by mouth at bedtime.     Multiple Vitamin (MULTIVITAMIN WITH MINERALS) TABS tablet Take 1 tablet by mouth in the morning. One-A-Day     Netarsudil -Latanoprost  (ROCKLATAN ) 0.02-0.005 % SOLN Place 1 drop into both eyes at bedtime.     niacin  250 MG tablet Take 250 mg by mouth in the morning.     Nutritional Supplements (CARNATION BREAKFAST ESSENTIALS) PACK Take 1 packet by mouth every other day.       Discharge Medications: Please see discharge summary for a list of discharge medications.  Relevant Imaging Results:  Relevant Lab Results:   Additional Information SSN: [id number]  Noreen KATHEE Pinal, LCSWA

## 2023-12-29 NOTE — Plan of Care (Signed)
  Problem: Acute Rehab PT Goals(only PT should resolve) Goal: Pt Will Go Supine/Side To Sit Outcome: Progressing Flowsheets (Taken 12/29/2023 1355) Pt will go Supine/Side to Sit: with minimal assist Goal: Patient Will Transfer Sit To/From Stand Outcome: Progressing Flowsheets (Taken 12/29/2023 1355) Patient will transfer sit to/from stand: with minimal assist Goal: Pt Will Transfer Bed To Chair/Chair To Bed Outcome: Progressing Flowsheets (Taken 12/29/2023 1355) Pt will Transfer Bed to Chair/Chair to Bed:  with min assist  with mod assist Goal: Pt Will Ambulate Outcome: Progressing Flowsheets (Taken 12/29/2023 1355) Pt will Ambulate:  25 feet  with minimal assist  with moderate assist  with rolling walker   1:56 PM, 12/29/23 Lynwood Music, MPT Physical Therapist with [facility] [phone] office [phone] mobile phone

## 2023-12-29 NOTE — ED Notes (Signed)
 Transition of Care [facility]) - Emergency Department Mini Assessment   Patient Details  Name: Tracey Holmes MRN: [id number] Date of Birth: Aug 01, 1933  Transition of Care [facility]) CM/SW Contact:    Noreen KATHEE Pinal, LCSWA Phone Number: 12/29/2023, 11:15 AM   Clinical Narrative:  CSW spoke with patient at bedside about SNF consult- patietn agreeable and said she would like to stay in [place]; Afterwards CSW attempted to contact Deena who is POA but currently working. CSW then called Beverley who is Deena mother about consult . Beverley is agreeable to CSW sending patient referral to [facility] as first choice and then [facility] . Beverley was also interested in [facility] or [facility] as other choices. Beverley did share that her and Wellington are supportive and been assisting with getting patient the help she need in her home. TOC will continue to follow.    ED Mini Assessment: What brought you to the Emergency Department? : fall  Barriers to Discharge: Other (must enter comment) (ED-Pending bed offers and note from PT for insurance auth)  Barrier interventions: Find placement  Means of departure: Ambulance  Interventions which prevented an admission or readmission: SNF Placement    Patient Contact and Communications Key Contact 1: Orlean and Beverley Sermon with: patient and in-law ( POA Wellington - mother ) Contact Date: 12/29/23,   Contact time: 1045 Contact Phone Number: [phone] Call outcome: SNF placement consult  Patient states their goals for this hospitalization and ongoing recovery are:: Get stronger CMS Medicare.gov Compare Post Acute Care list provided to:: Patient Represenative (must comment) Choice offered to / list presented to : Patient, HC POA / Guardian Sherrie- in-law who is Chief Operating Officer ( POA) mom)  Admission diagnosis:  Fall, Headache Patient Active Problem List   Diagnosis Date Noted   Aortic root dilation (HCC) 06/18/2022   Lung nodule 09/04/2020   PFO (patent foramen ovale)    Mixed  hyperlipidemia 05/16/2020   Aphasia    Embolic stroke involving cerebral artery (HCC)    Closed right hip fracture (HCC) 01/26/2020   Thoracic aortic aneurysm without rupture (HCC) 02/01/2019   HTN (hypertension)    Personal history of kidney stones    Cystic kidney disease    Dyslipidemia    Hyperlipidemia 11/17/2007   Essential hypertension 11/17/2007   LUNG NODULE 11/17/2007   RASH AND OTHER NONSPECIFIC SKIN ERUPTION 11/10/2007   PCP:  Teresa Channel, MD Pharmacy:   [facility] [phone] - [place], [place] - [address] [place] [address] Phone: [phone] Fax: [phone]

## 2023-12-29 NOTE — Evaluation (Signed)
 Physical Therapy Evaluation Patient Details Name: Tracey Holmes MRN: [id number] DOB: 1933/08/14 Today's Date: 12/29/2023  History of Present Illness  Tracey Holmes is a 89 y.o. female.        The history is provided by the patient and a friend.   Fall       She has history of hypertension, hyperlipidemia, thoracic aortic aneurysm, DVT no longer on anticoagulation and comes in following a fall at home.  Patient states she does not know why she fell but denies hitting her head.  She denies pain anywhere.  Neighbor states that she was not able to stand and she is normally ambulatory with a walker.  However, neighbor states that she has been generally declining for several months and has had several falls at home.   Clinical Impression  Patient demonstrates slow labored movement for sitting up at bedside, very unsteady on feet and limited to a few side steps and steps forward/backwards before having to sit due to BLE weakness with buckling of knees and fall risk. Patient tolerated sitting up in chair after therapy. Patient will benefit from continued skilled physical therapy in hospital and recommended venue below to increase strength, balance, endurance for safe ADLs and gait.           If plan is discharge home, recommend the following: A lot of help with walking and/or transfers;A lot of help with bathing/dressing/bathroom;Assistance with cooking/housework;Help with stairs or ramp for entrance   Can travel by private vehicle   No    Equipment Recommendations None recommended by PT  Recommendations for Other Services  OT consult    Functional Status Assessment Patient has had a recent decline in their functional status and demonstrates the ability to make significant improvements in function in a reasonable and predictable amount of time.     Precautions / Restrictions Precautions Precautions: Fall Restrictions Weight Bearing Restrictions Per Provider Order: No       Mobility  Bed Mobility Overal bed mobility: Needs Assistance Bed Mobility: Supine to Sit     Supine to sit: Min assist, Mod assist     General bed mobility comments: increased time, labored movement    Transfers Overall transfer level: Needs assistance Equipment used: Rolling walker (2 wheels) Transfers: Sit to/from Stand, Bed to chair/wheelchair/BSC Sit to Stand: Mod assist   Step pivot transfers: Mod assist       General transfer comment: slow labored movement with buckling of knees    Ambulation/Gait Ambulation/Gait assistance: Mod assist, Max assist Gait Distance (Feet): 6 Feet Assistive device: Rolling walker (2 wheels) Gait Pattern/deviations: Decreased step length - right, Decreased step length - left, Decreased stance time - left, Decreased stride length, Knees buckling, Trunk flexed Gait velocity: slow     General Gait Details: limited to a few side steps, steps forward/backwards before having to sit due to BLE weakness with buckling of knees  Stairs            Wheelchair Mobility     Tilt Bed    Modified Rankin (Stroke Patients Only)       Balance                                             Pertinent Vitals/Pain Pain Assessment Pain Assessment: No/denies pain    Home Living Family/patient expects to be discharged to:: Private residence Living  Arrangements: Alone Available Help at Discharge: Friend(s);Available PRN/intermittently Type of Home: House Home Access: Stairs to enter;Ramped entrance Entrance Stairs-Rails: Right;Left;Can reach both Entrance Stairs-Number of Steps: 4 steps in front, 2 steps in back   Home Layout: One level Home Equipment: Agricultural consultant (2 wheels);Cane - single point;Shower seat;BSC/3in1;Grab bars - tub/shower      Prior Function Prior Level of Function : Needs assist       Physical Assist : Mobility (physical);ADLs (physical) Mobility (physical): Bed  mobility;Transfers;Gait;Stairs   Mobility Comments: household ambulation using RW, uses electric carts in stores ADLs Comments: Has home aide 3 days/week up to 4 hours/day, neighbors/friends assist with community ADLs     Extremity/Trunk Assessment   Upper Extremity Assessment Upper Extremity Assessment: Generalized weakness    Lower Extremity Assessment Lower Extremity Assessment: Generalized weakness    Cervical / Trunk Assessment Cervical / Trunk Assessment: Kyphotic  Communication   Communication Communication: No apparent difficulties    Cognition Arousal: Alert Behavior During Therapy: WFL for tasks assessed/performed   PT - Cognitive impairments: No apparent impairments                         Following commands: Intact       Cueing Cueing Techniques: Verbal cues, Tactile cues     General Comments      Exercises     Assessment/Plan    PT Assessment Patient needs continued PT services  PT Problem List Decreased strength;Decreased activity tolerance;Decreased balance;Decreased mobility       PT Treatment Interventions DME instruction;Gait training;Stair training;Functional mobility training;Therapeutic activities;Therapeutic exercise;Balance training;Patient/family education    PT Goals (Current goals can be found in the Care Plan section)  Acute Rehab PT Goals Patient Stated Goal: return home after rehab PT Goal Formulation: With patient Time For Goal Achievement: 01/12/24 Potential to Achieve Goals: Good    Frequency Min 2X/week     Co-evaluation               AM-PAC PT 6 Clicks Mobility  Outcome Measure Help needed turning from your back to your side while in a flat bed without using bedrails?: A Little Help needed moving from lying on your back to sitting on the side of a flat bed without using bedrails?: A Lot Help needed moving to and from a bed to a chair (including a wheelchair)?: A Lot Help needed standing up from a  chair using your arms (e.g., wheelchair or bedside chair)?: A Lot Help needed to walk in hospital room?: A Lot Help needed climbing 3-5 steps with a railing? : A Lot 6 Click Score: 13    End of Session   Activity Tolerance: Patient tolerated treatment well;Patient limited by fatigue Patient left: in chair;with call bell/phone within reach Nurse Communication: Mobility status PT Visit Diagnosis: Unsteadiness on feet (R26.81);Other abnormalities of gait and mobility (R26.89);Muscle weakness (generalized) (M62.81)    Time: 8897-8877 PT Time Calculation (min) (ACUTE ONLY): 20 min   Charges:   PT Evaluation $PT Eval Moderate Complexity: 1 Mod PT Treatments $Therapeutic Activity: 8-22 mins PT General Charges $$ ACUTE PT VISIT: 1 Visit         1:54 PM, 12/29/23 Lynwood Music, MPT Physical Therapist with [facility] [phone] office [phone] mobile phone

## 2023-12-29 NOTE — ED Provider Notes (Signed)
 [place] EMERGENCY DEPARTMENT AT [facility] Provider Note   CSN: [id number] Arrival date & time: 12/28/23  2136     Patient presents with: Tracey Holmes is a 89 y.o. female.   The history is provided by the patient and a friend.  Fall   She has history of hypertension, hyperlipidemia, thoracic aortic aneurysm, DVT no longer on anticoagulation and comes in following a fall at home.  Patient states she does not know why she fell but denies hitting her head.  She denies pain anywhere.  Neighbor states that she was not able to stand and she is normally ambulatory with a walker.  However, neighbor states that she has been generally declining for several months and has had several falls at home.    Prior to Admission medications   Medication Sig Start Date End Date Taking? Authorizing Provider  acetaminophen  (TYLENOL ) 500 MG tablet Take 500 mg by mouth 2 (two) times daily as needed (pain.).    [provider]  amLODipine  (NORVASC ) 5 MG tablet Take 5 mg by mouth in the morning. 12/29/10   [provider]  aspirin  EC 81 MG tablet Take 81 mg by mouth in the morning and at bedtime.    [provider]  brimonidine  (ALPHAGAN ) 0.2 % ophthalmic solution Place 1 drop into both eyes 3 (three) times daily. 10/26/19   [provider]  cetirizine (ZYRTEC) 10 MG tablet Take 10 mg by mouth at bedtime.    [provider]  Cholecalciferol  (VITAMIN D ) 2000 UNITS tablet Take 2,000 Units by mouth in the morning.    [provider]  denosumab  (PROLIA ) 60 MG/ML SOLN injection Inject 60 mg into the skin every 6 (six) months. Administer in upper arm, thigh, or abdomen    [provider]  dorzolamide -timolol  (COSOPT ) 22.3-6.8 MG/ML ophthalmic solution Place 1 drop into both eyes 2 (two) times daily.    [provider]  lactose free nutrition (BOOST) LIQD Take 237 mLs by mouth every other day.    [provider]   mirtazapine  (REMERON ) 15 MG tablet Take 15 mg by mouth at bedtime. 07/14/23   [provider]  Multiple Vitamin (MULTIVITAMIN WITH MINERALS) TABS tablet Take 1 tablet by mouth in the morning. One-A-Day    [provider]  Netarsudil -Latanoprost  (ROCKLATAN ) 0.02-0.005 % SOLN Place 1 drop into both eyes at bedtime. 03/22/20   [provider]  niacin  250 MG tablet Take 250 mg by mouth in the morning.    [provider]  Nutritional Supplements (CARNATION BREAKFAST ESSENTIALS) PACK Take 1 packet by mouth every other day.    [provider]    Allergies: Benicar [olmesartan], Diovan [valsartan], Fosamax [alendronate], and Penicillins    Review of Systems  All other systems reviewed and are negative.   Updated Vital Signs BP 139/72   Pulse 99   Temp 98.4 F (36.9 C) (Oral)   Resp 16   Ht 5' 2 (1.575 m)   Wt 50 kg   SpO2 97%   BMI 20.16 kg/m   Physical Exam Vitals and nursing note reviewed.   89 year old female, resting comfortably and in no acute distress. Vital signs are normal. Oxygen saturation is 97%, which is normal. Head is normocephalic and atraumatic. PERRLA, EOMI.  Neck is nontender. Back is nontender. Lungs are clear without rales, wheezes, or rhonchi. Chest is nontender. Heart has regular rate and rhythm without murmur. Abdomen is soft, flat, nontender.  Pelvis is stable and nontender. Skin is warm and dry without rash. Neurologic: Mental status is normal, cranial nerves are intact, moves all extremities equally.  (all labs ordered are listed, but only abnormal results are displayed) Labs Reviewed  COMPREHENSIVE METABOLIC PANEL WITH GFR - Abnormal; Notable for the following components:      Result Value   CO2 21 (*)    Glucose, Bld 155 (*)    BUN 29 (*)    GFR, Estimated 59 (*)    All other components within normal limits  CBC WITH DIFFERENTIAL/PLATELET - Abnormal; Notable for the following components:   WBC 14.6 (*)     Neutro Abs 12.1 (*)    Monocytes Absolute 1.4 (*)    All other components within normal limits  CK  URINALYSIS, ROUTINE W REFLEX MICROSCOPIC     Radiology: CT Head Wo Contrast Result Date: 12/29/2023 CLINICAL DATA:  Recent fall with headaches and neck pain, initial encounter EXAM: CT HEAD WITHOUT CONTRAST CT CERVICAL SPINE WITHOUT CONTRAST TECHNIQUE: Multidetector CT imaging of the head and cervical spine was performed following the standard protocol without intravenous contrast. Multiplanar CT image reconstructions of the cervical spine were also generated. RADIATION DOSE REDUCTION: This exam was performed according to the departmental dose-optimization program which includes automated exposure control, adjustment of the mA and/or kV according to patient size and/or use of iterative reconstruction technique. COMPARISON:  09/02/2023 FINDINGS: CT HEAD FINDINGS Brain: No evidence of acute infarction, hemorrhage, hydrocephalus, extra-axial collection or mass lesion/mass effect. Chronic atrophic changes are noted. Findings consistent with prior left MCA infarct are again noted. Scattered chronic white matter ischemic changes are noted. 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 Vascular: No hyperdense vessel or unexpected calcification. Skull: Normal. Negative for fracture or focal lesion. Sinuses/Orbits: Surgical changes are noted in the orbits bilaterally. Sinuses are clear. Other: None CT CERVICAL SPINE FINDINGS Alignment: Within normal limits. Skull base and vertebrae: 7 cervical segments are well visualized. Vertebral body height is well maintained. No acute fracture or acute facet abnormality is noted. Mild facet hypertrophic changes are noted. Odontoid is within normal limits. Soft tissues and spinal canal: No prevertebral fluid or swelling. No visible canal hematoma. Upper chest: Visualized lung apices are within normal limits. Other: None IMPRESSION: CT of  the head: Chronic ischemic and atrophic changes without acute abnormality. CT of the cervical spine: Degenerative change without acute abnormality. Electronically Signed   By: Oneil Devonshire M.D.   On: 12/29/2023 01:39   CT Cervical Spine Wo Contrast Result Date: 12/29/2023 CLINICAL DATA:  Recent fall with headaches and neck pain, initial encounter EXAM: CT HEAD WITHOUT CONTRAST CT CERVICAL SPINE WITHOUT CONTRAST TECHNIQUE: Multidetector CT imaging of the head and cervical spine was performed following the standard protocol without intravenous contrast. Multiplanar CT image reconstructions of the cervical spine were also generated. RADIATION DOSE REDUCTION: This exam was performed according to the departmental dose-optimization program which includes automated exposure control, adjustment of the mA and/or kV according to patient size and/or use of iterative reconstruction technique. COMPARISON:  09/02/2023 FINDINGS: CT HEAD FINDINGS Brain: No evidence of acute infarction, hemorrhage, hydrocephalus, extra-axial collection or mass lesion/mass effect. Chronic atrophic changes are noted. Findings consistent with prior left MCA infarct are again noted. Scattered chronic white matter ischemic changes are noted. 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 Vascular: No hyperdense vessel or unexpected calcification. Skull: Normal. Negative for  fracture or focal lesion. Sinuses/Orbits: Surgical changes are noted in the orbits bilaterally. Sinuses are clear. Other: None CT CERVICAL SPINE FINDINGS Alignment: Within normal limits. Skull base and vertebrae: 7 cervical segments are well visualized. Vertebral body height is well maintained. No acute fracture or acute facet abnormality is noted. Mild facet hypertrophic changes are noted. Odontoid is within normal limits. Soft tissues and spinal canal: No prevertebral fluid or swelling. No visible canal hematoma. Upper chest: Visualized lung  apices are within normal limits. Other: None IMPRESSION: CT of the head: Chronic ischemic and atrophic changes without acute abnormality. CT of the cervical spine: Degenerative change without acute abnormality. Electronically Signed   By: Oneil Devonshire M.D.   On: 12/29/2023 01:39   DG HIP UNILAT WITH PELVIS 2-3 VIEWS LEFT Result Date: 12/29/2023 CLINICAL DATA:  Recent fall with left hip pain, initial encounter EXAM: DG HIP (WITH OR WITHOUT PELVIS) 3V LEFT COMPARISON:  None Available. FINDINGS: Pelvic ring is intact. The proximal femur is within normal limits. No soft tissue abnormality is noted. IMPRESSION: No acute abnormality in the proximal left hip. Electronically Signed   By: Oneil Devonshire M.D.   On: 12/29/2023 01:33   DG HIP UNILAT WITH PELVIS 2-3 VIEWS RIGHT Result Date: 12/29/2023 CLINICAL DATA:  Recent fall with right hip pain, initial encounter EXAM: DG HIP (WITH OR WITHOUT PELVIS) 3V RIGHT COMPARISON:  None Available. FINDINGS: Right hip prosthesis is noted. Pelvic ring is within normal limits. No dislocation or acute fracture is seen. Mild degenerative changes of left hip joint are noted. IMPRESSION: Status post right hip replacement.  No acute abnormality noted. Electronically Signed   By: Oneil Devonshire M.D.   On: 12/29/2023 01:33     Procedures   Medications Ordered in the ED  aspirin  EC tablet 81 mg (has no administration in time range)  amLODipine  (NORVASC ) tablet 5 mg (has no administration in time range)  mirtazapine  (REMERON ) tablet 15 mg (has no administration in time range)  Vitamin D  2,000 Units (has no administration in time range)  multivitamin with minerals tablet 1 tablet (has no administration in time range)  niacin  (VITAMIN B3) tablet 250 mg (has no administration in time range)  loratadine  (CLARITIN ) tablet 10 mg (has no administration in time range)  brimonidine  (ALPHAGAN ) 0.2 % ophthalmic solution 1 drop (has no administration in time range)  dorzolamide -timolol   (COSOPT ) 2-0.5 % ophthalmic solution 1 drop (has no administration in time range)  Netarsudil -Latanoprost  0.02-0.005 % SOLN 1 drop (has no administration in time range)  acetaminophen  (TYLENOL ) tablet 650 mg (has no administration in time range)                                    Medical Decision Making Amount and/or Complexity of Data Reviewed Labs: ordered. Radiology: ordered.  Risk OTC drugs. Prescription drug management.   Fall without any injury obvious on physical exam, but reported to be unable to walk at home.  She is status post right hip arthroplasty.  I have ordered CT of head and cervical spine as well as x-rays of both hips although she shows no clinical sign of hip injury.  I have also ordered screening labs including urinalysis to look for occult UTI.  Of note, she does live alone.  X-rays and CT scans show no evidence of acute injury or acute process.  Have independently viewed all the images, and agree with the  radiologist's interpretation.  I reviewed her laboratory tests, my interpretation is no evidence of UTI, elevated random glucose which will need to be followed as an outpatient, elevated BUN which may indicate some degree of dehydration, mild leukocytosis which is nonspecific, normal hemoglobin, normal CK-no evidence of rhabdomyolysis.  Attempts were made to ambulate the patient, and she was completely unable to bear weight.  She is not safe for discharge home.  She is being held in the emergency department for transitions of care consult.  She will likely need nursing home placement.     Final diagnoses:  Fall at home, initial encounter  Inability to walk  Elevated random blood glucose level  Elevated BUN    ED Discharge Orders     None          Raford Lenis, MD 12/29/23 [phone]

## 2023-12-30 NOTE — ED Notes (Signed)
 PT at bedside at this time

## 2023-12-30 NOTE — ED Notes (Signed)
 Pt sitting up in chair from PT, will change linen on bed.

## 2023-12-30 NOTE — ED Provider Notes (Signed)
 Emergency Medicine Observation Re-evaluation Note  Tracey Holmes is a 89 y.o. female, seen on rounds today.  Pt initially presented to the ED for complaints of Fall Currently, the patient is working with OT, denies complaints .  Physical Exam  BP (!) 117/103 (BP Location: Right Arm)   Pulse 83   Temp 98.5 F (36.9 C) (Oral)   Resp (!) 23   Ht 5' 2 (1.575 m)   Wt 50 kg   SpO2 95%   BMI 20.16 kg/m  Physical Exam General: NAD Cardiac: regular rate  Lungs: equal chest rise Psych: calm  ED Course / MDM  EKG:   I have reviewed the labs performed to date as well as medications administered while in observation.  Recent changes in the last 24 hours include none.  Plan  Current plan is for placement.    Francesca Elsie CROME, MD 12/30/23 [phone]

## 2023-12-30 NOTE — ED Notes (Signed)
 Met with pt to review bed offer for STR. Pt accepted bed offer for [facility]. Left VM with pt's POA, Deena [phone]) to inform of discharge plans. Spoke with Beverley Croak [phone] and confirmed discharge plans for SNF at [facility]. Confirmed bed acceptance with [facility] at [facility]. Insurance auth being requested. CSW will follow for auth approval.

## 2023-12-30 NOTE — Plan of Care (Signed)
  Problem: Acute Rehab OT Goals (only OT should resolve) Goal: Pt. Will Perform Grooming Flowsheets (Taken 12/30/2023 1140) Pt Will Perform Grooming:  with modified independence  standing Goal: Pt. Will Perform Upper Body Dressing Flowsheets (Taken 12/30/2023 1140) Pt Will Perform Upper Body Dressing:  with modified independence  sitting Goal: Pt. Will Perform Lower Body Dressing Flowsheets (Taken 12/30/2023 1140) Pt Will Perform Lower Body Dressing:  with min assist  with adaptive equipment  sitting/lateral leans Goal: Pt. Will Transfer To Toilet Flowsheets (Taken 12/30/2023 1140) Pt Will Transfer to Toilet:  with contact guard assist  ambulating Goal: Pt. Will Perform Toileting-Clothing Manipulation Flowsheets (Taken 12/30/2023 1140) Pt Will Perform Toileting - Clothing Manipulation and hygiene:  with contact guard assist  sitting/lateral leans Goal: Pt/Caregiver Will Perform Home Exercise Program Flowsheets (Taken 12/30/2023 1140) Pt/caregiver will Perform Home Exercise Program:  Increased ROM  Increased strength  Right Upper extremity  Left upper extremity  Independently  Tracey Holmes OT, MOT

## 2023-12-30 NOTE — Evaluation (Signed)
 Occupational Therapy Evaluation Patient Details Name: Tracey Holmes MRN: [id number] DOB: [birth year] Today's Date: 12/30/2023   History of Present Illness   Tracey Holmes is a 89 y.o. female.        The history is provided by the patient and a friend.   Fall       She has history of hypertension, hyperlipidemia, thoracic aortic aneurysm, DVT no longer on anticoagulation and comes in following a fall at home.  Patient states she does not know why she fell but denies hitting her head.  She denies pain anywhere.  Neighbor states that she was not able to stand and she is normally ambulatory with a walker.  However, neighbor states that she has been generally declining for several months and has had several falls at home. (per MD)     Clinical Impressions Pt agreeable to OT evaluation. Pt required CGA for bed mobility with HOB elevated and with extended time. Pt required min to mod A for EOB to chair transfer mostly due to labored effort and extended time. Pt unable to complete lower body ADL's today when briefly attempted. Max total assist for lower body tasks. Pt left with tooth brush and comb at the end of session. Pt able to brush hair as this therapist was leaving. Pt left in the chair with call bell within reach. Pt will benefit from continued OT in the hospital and recommended venue below to increase strength, balance, and endurance for safe ADL's.        If plan is discharge home, recommend the following:   A lot of help with walking and/or transfers;A lot of help with bathing/dressing/bathroom;Assistance with cooking/housework;Assist for transportation;Help with stairs or ramp for entrance     Functional Status Assessment   Patient has had a recent decline in their functional status and demonstrates the ability to make significant improvements in function in a reasonable and predictable amount of time.     Equipment Recommendations   None recommended by OT              Precautions/Restrictions   Precautions Precautions: Fall Recall of Precautions/Restrictions: Intact Restrictions Weight Bearing Restrictions Per Provider Order: No     Mobility Bed Mobility Overal bed mobility: Needs Assistance Bed Mobility: Supine to Sit     Supine to sit: Contact guard, HOB elevated     General bed mobility comments: extended time    Transfers Overall transfer level: Needs assistance Equipment used: Rolling walker (2 wheels) Transfers: Sit to/from Stand, Bed to chair/wheelchair/BSC Sit to Stand: Min assist     Step pivot transfers: Min assist, Mod assist     General transfer comment: very slow and labored movement; cuing for steps to chair      Balance Overall balance assessment: Needs assistance Sitting-balance support: No upper extremity supported, Feet supported Sitting balance-Leahy Scale: Fair Sitting balance - Comments: seated at EOB   Standing balance support: Bilateral upper extremity supported, During functional activity, Reliant on assistive device for balance Standing balance-Leahy Scale: Poor Standing balance comment: poor to fair with RW                           ADL either performed or assessed with clinical judgement   ADL Overall ADL's : Needs assistance/impaired     Grooming: Set up;Sitting   Upper Body Bathing: Set up;Minimal assistance;Sitting   Lower Body Bathing: Maximal assistance;Total assistance;Sitting/lateral leans   Upper Body Dressing :  Set up;Minimal assistance;Sitting   Lower Body Dressing: Maximal assistance;Total assistance;Sitting/lateral leans Lower Body Dressing Details (indicate cue type and reason): Unable to doff socks seated in the chair. Toilet Transfer: Minimal assistance;Stand-pivot;Rolling walker (2 wheels);Moderate assistance Toilet Transfer Details (indicate cue type and reason): EOB to chair with RW Toileting- Clothing Manipulation and Hygiene: Moderate assistance;Maximal  assistance;Sitting/lateral lean       Functional mobility during ADLs:  (too fatigued to attempt after step pivot to the chair with RW)       Vision Baseline Vision/History: 1 Wears glasses;3 Glaucoma Ability to See in Adequate Light: 1 Impaired Patient Visual Report: No change from baseline Vision Assessment?: No apparent visual deficits     Perception Perception: Not tested       Praxis Praxis: Not tested       Pertinent Vitals/Pain Pain Assessment Pain Assessment: No/denies pain     Extremity/Trunk Assessment Upper Extremity Assessment Upper Extremity Assessment: RUE deficits/detail;LUE deficits/detail;Right hand dominant RUE Deficits / Details: 3+/5 shoudler flexion; generally weak otherwise. 4/5 gross grasp LUE Deficits / Details: 3-/5 shoulder flexion; generally weak otherwise. 4/5 gross grasp   Lower Extremity Assessment Lower Extremity Assessment: Defer to PT evaluation   Cervical / Trunk Assessment Cervical / Trunk Assessment: Kyphotic   Communication Communication Communication: Impaired Factors Affecting Communication: Hearing impaired   Cognition Arousal: Alert Behavior During Therapy: WFL for tasks assessed/performed Cognition: No apparent impairments                               Following commands: Intact       Cueing  General Comments   Cueing Techniques: Verbal cues;Tactile cues                 Home Living Family/patient expects to be discharged to:: Private residence Living Arrangements: Alone Available Help at Discharge: Friend(s);Available PRN/intermittently Type of Home: House Home Access: Stairs to enter;Ramped entrance Entrance Stairs-Number of Steps: 4 steps in front, 2 steps in back Entrance Stairs-Rails: Right;Left;Can reach both Home Layout: One level     Bathroom Shower/Tub: Chief Strategy Officer: Handicapped height Bathroom Accessibility: Yes   Home Equipment: Agricultural consultant (2  wheels);Cane - single point;Shower seat;BSC/3in1;Grab bars - tub/shower   Additional Comments: per chart      Prior Functioning/Environment Prior Level of Function : Needs assist       Physical Assist : ADLs (physical)   ADLs (physical): Bathing;Dressing;Toileting;IADLs Mobility Comments: household ambulation using RW, uses electric carts in stores ADLs Comments: Has home aide 3 days/week up to 4 hours/day, neighbors/friends assist with community ADLs; pt reports she is assited with bathing, dressing, and toileting a lot at baseline by aides.    OT Problem List: Decreased strength;Decreased range of motion;Decreased activity tolerance;Impaired balance (sitting and/or standing)   OT Treatment/Interventions: Self-care/ADL training;Therapeutic exercise;Energy conservation;DME and/or AE instruction;Therapeutic activities;Patient/family education;Balance training      OT Goals(Current goals can be found in the care plan section)   Acute Rehab OT Goals Patient Stated Goal: improve function OT Goal Formulation: With patient Time For Goal Achievement: 01/13/24 Potential to Achieve Goals: Good   OT Frequency:  Min 3X/week                                   End of Session Equipment Utilized During Treatment: Rolling walker (2 wheels) Nurse Communication: Mobility status  Activity Tolerance: Patient tolerated treatment well Patient left: in chair;with call bell/phone within reach  OT Visit Diagnosis: Unsteadiness on feet (R26.81);Other abnormalities of gait and mobility (R26.89);Muscle weakness (generalized) (M62.81);History of falling (Z91.81)                Time: 9089-9073 OT Time Calculation (min): 16 min Charges:  OT General Charges $OT Visit: 1 Visit OT Evaluation $OT Eval Low Complexity: 1 Low  Sabrea Sankey OT, MOT  Jayson Person 12/30/2023, 11:38 AM

## 2023-12-31 DIAGNOSIS — M6281 Muscle weakness (generalized): Secondary | ICD-10-CM | POA: Diagnosis not present

## 2023-12-31 DIAGNOSIS — R4701 Aphasia: Secondary | ICD-10-CM | POA: Diagnosis not present

## 2023-12-31 DIAGNOSIS — I69311 Memory deficit following cerebral infarction: Secondary | ICD-10-CM | POA: Diagnosis not present

## 2023-12-31 DIAGNOSIS — H409 Unspecified glaucoma: Secondary | ICD-10-CM | POA: Diagnosis not present

## 2023-12-31 DIAGNOSIS — I69320 Aphasia following cerebral infarction: Secondary | ICD-10-CM | POA: Diagnosis not present

## 2023-12-31 DIAGNOSIS — Q619 Cystic kidney disease, unspecified: Secondary | ICD-10-CM | POA: Diagnosis not present

## 2023-12-31 DIAGNOSIS — R2681 Unsteadiness on feet: Secondary | ICD-10-CM | POA: Diagnosis not present

## 2023-12-31 DIAGNOSIS — I69391 Dysphagia following cerebral infarction: Secondary | ICD-10-CM | POA: Diagnosis not present

## 2023-12-31 DIAGNOSIS — E441 Mild protein-calorie malnutrition: Secondary | ICD-10-CM | POA: Diagnosis not present

## 2023-12-31 DIAGNOSIS — R2689 Other abnormalities of gait and mobility: Secondary | ICD-10-CM | POA: Diagnosis not present

## 2023-12-31 DIAGNOSIS — E785 Hyperlipidemia, unspecified: Secondary | ICD-10-CM | POA: Diagnosis not present

## 2023-12-31 DIAGNOSIS — I77810 Thoracic aortic ectasia: Secondary | ICD-10-CM | POA: Diagnosis not present

## 2023-12-31 DIAGNOSIS — R262 Difficulty in walking, not elsewhere classified: Secondary | ICD-10-CM | POA: Diagnosis not present

## 2023-12-31 DIAGNOSIS — I10 Essential (primary) hypertension: Secondary | ICD-10-CM | POA: Diagnosis not present

## 2023-12-31 NOTE — TOC Transition Note (Signed)
 Transition of Care [facility]) - Discharge Note   Patient Details  Name: Tracey Holmes MRN: [id number] Date of Birth: 11-Nov-1933  Transition of Care [facility]) CM/SW Contact:  Hoy DELENA Bigness, LCSW Phone Number: 12/31/2023, 10:11 AM   Clinical Narrative:    Pt's insurance auth approved for SNF. Pt able to transfer to [facility] for STR. Pt will be going to room 102. RN to call report to [phone]. Spoke with Devere Croak and confirmed DC plans. Left VM for pt's POA to inform of DC. Pt to be transported via [facility].   Final next level of care: Skilled Nursing Facility Barriers to Discharge: Barriers Resolved   Patient Goals and CMS Choice Patient states their goals for this hospitalization and ongoing recovery are:: Get stronger CMS Medicare.gov Compare Post Acute Care list provided to:: Patient Represenative (must comment) Choice offered to / list presented to : Patient, HC POA / Guardian Sherrie- in-law who is Chief Operating Officer ( POA) mom) [place] ownership interest in [facility].provided to:: [facility] POA / Guardian    Discharge Placement   Existing PASRR number confirmed : 12/30/23          Patient chooses bed at: [facility] Patient to be transferred to facility by: [facility] Name of family member notified: Lyssa Hackley Patient and family notified of of transfer: 12/31/23  Discharge Plan and Services Additional resources added to the After Visit Summary for                  DME Arranged: N/A DME Agency: NA                  Social Drivers of Health (SDOH) Interventions SDOH Screenings   Housing: Unknown (09/17/2023)   Received from [facility]  Tobacco Use: Low Risk  (12/28/2023)     Readmission Risk Interventions     No data to display

## 2023-12-31 NOTE — ED Provider Notes (Signed)
 Emergency Medicine Observation Re-evaluation Note  Tracey Holmes is a 89 y.o. female, seen on rounds today.  Pt initially presented to the ED for complaints of Fall Currently, the patient is sleeping.  Physical Exam  BP 138/63   Pulse 65   Temp 97.7 F (36.5 C) (Oral)   Resp 16   Ht 5' 2 (1.575 m)   Wt 50 kg   SpO2 100%   BMI 20.16 kg/m  Physical Exam General: No acute distress Cardiac: Well-perfused Lungs: Nonlabored Psych: Calm  ED Course / MDM  EKG:   I have reviewed the labs performed to date as well as medications administered while in observation.  Recent changes in the last 24 hours include social work working on [employer].  Plan  Current plan is for SNF.    Towana Ozell BROCKS, MD 12/31/23 [phone]

## 2023-12-31 NOTE — Progress Notes (Signed)
 Physical Therapy Treatment Patient Details Name: Tracey Holmes MRN: [id number] DOB: [birth year] Today's Date: 12/31/2023   History of Present Illness Tracey Holmes is a 89 y.o. female.        The history is provided by the patient and a friend.   Fall       She has history of hypertension, hyperlipidemia, thoracic aortic aneurysm, DVT no longer on anticoagulation and comes in following a fall at home.  Patient states she does not know why she fell but denies hitting her head.  She denies pain anywhere.  Neighbor states that she was not able to stand and she is normally ambulatory with a walker.  However, neighbor states that she has been generally declining for several months and has had several falls at home. (per MD)    PT Comments  Patient agreeable for therapy.  Patient demonstrates slow labored movement for sitting up at bedside with fair carryover for propping up on elbows to hands, unsteady on feet with labored movement during transfer to chair, increased endurance/distance for taking steps forward/backwards at bedside with slow labored movement and baseline right foot drop. Patient tolerated sitting up in chair after therapy. Patient will benefit from continued skilled physical therapy in hospital and recommended venue below to increase strength, balance, endurance for safe ADLs and gait.       If plan is discharge home, recommend the following: A lot of help with walking and/or transfers;A little help with bathing/dressing/bathroom;A lot of help with bathing/dressing/bathroom;Assistance with cooking/housework;Assist for transportation;Help with stairs or ramp for entrance   Can travel by private vehicle     Yes  Equipment Recommendations  None recommended by PT    Recommendations for Other Services       Precautions / Restrictions Precautions Precautions: Fall Recall of Precautions/Restrictions: Intact Restrictions Weight Bearing Restrictions Per Provider Order: No      Mobility  Bed Mobility Overal bed mobility: Needs Assistance Bed Mobility: Supine to Sit (HOB flat)     Supine to sit: Min assist, Contact guard     General bed mobility comments: slow labored movement with fair/good carryover for propping up on elbows to hands    Transfers Overall transfer level: Needs assistance Equipment used: Rolling walker (2 wheels) Transfers: Sit to/from Stand, Bed to chair/wheelchair/BSC Sit to Stand: Min assist   Step pivot transfers: Min assist, Mod assist       General transfer comment: slow labored unsteady movement    Ambulation/Gait Ambulation/Gait assistance: Mod assist Gait Distance (Feet): 15 Feet Assistive device: Rolling walker (2 wheels) Gait Pattern/deviations: Decreased step length - right, Decreased step length - left, Decreased stance time - right, Decreased stride length, Decreased dorsiflexion - right Gait velocity: decreased     General Gait Details: increased endurance/distance for taking steps forward/backwards at bedside with unsteady labored movement, limited mostly due to fatigue   Stairs             Wheelchair Mobility     Tilt Bed    Modified Rankin (Stroke Patients Only)       Balance Overall balance assessment: Needs assistance Sitting-balance support: Feet supported, No upper extremity supported Sitting balance-Leahy Scale: Poor Sitting balance - Comments: fair/poor seated in chair when completing exercises   Standing balance support: Reliant on assistive device for balance, During functional activity, Bilateral upper extremity supported Standing balance-Leahy Scale: Poor Standing balance comment: fair/poor using RW  Communication Communication Communication: Impaired Factors Affecting Communication: Hearing impaired  Cognition Arousal: Alert Behavior During Therapy: WFL for tasks assessed/performed   PT - Cognitive impairments: No apparent  impairments                         Following commands: Intact      Cueing Cueing Techniques: Verbal cues, Tactile cues  Exercises      General Comments        Pertinent Vitals/Pain Pain Assessment Pain Assessment: No/denies pain    Home Living                          Prior Function            PT Goals (current goals can now be found in the care plan section) Acute Rehab PT Goals Patient Stated Goal: Return home after rehab PT Goal Formulation: With patient Time For Goal Achievement: 01/12/24 Potential to Achieve Goals: Good Progress towards PT goals: Progressing toward goals    Frequency    Min 2X/week      PT Plan      Co-evaluation              AM-PAC PT 6 Clicks Mobility   Outcome Measure  Help needed turning from your back to your side while in a flat bed without using bedrails?: A Little Help needed moving from lying on your back to sitting on the side of a flat bed without using bedrails?: A Little Help needed moving to and from a bed to a chair (including a wheelchair)?: A Lot Help needed standing up from a chair using your arms (e.g., wheelchair or bedside chair)?: A Lot Help needed to walk in hospital room?: A Lot Help needed climbing 3-5 steps with a railing? : A Lot 6 Click Score: 14    End of Session   Activity Tolerance: Patient tolerated treatment well;Patient limited by fatigue Patient left: in chair;with call bell/phone within reach Nurse Communication: Mobility status PT Visit Diagnosis: Unsteadiness on feet (R26.81);Other abnormalities of gait and mobility (R26.89);Muscle weakness (generalized) (M62.81)     Time: 8976-8955 PT Time Calculation (min) (ACUTE ONLY): 21 min  Charges:    $Therapeutic Exercise: 8-22 mins $Therapeutic Activity: 8-22 mins PT General Charges $$ ACUTE PT VISIT: 1 Visit                     11:45 AM, 12/31/23 Lynwood Music, MPT Physical Therapist with [facility] 336 [phone] office [phone] mobile phone

## 2024-01-04 DIAGNOSIS — E441 Mild protein-calorie malnutrition: Secondary | ICD-10-CM | POA: Diagnosis not present

## 2024-01-04 DIAGNOSIS — H409 Unspecified glaucoma: Secondary | ICD-10-CM | POA: Diagnosis not present

## 2024-01-04 DIAGNOSIS — E785 Hyperlipidemia, unspecified: Secondary | ICD-10-CM | POA: Diagnosis not present

## 2024-01-04 DIAGNOSIS — Q619 Cystic kidney disease, unspecified: Secondary | ICD-10-CM | POA: Diagnosis not present

## 2024-01-04 DIAGNOSIS — R2681 Unsteadiness on feet: Secondary | ICD-10-CM | POA: Diagnosis not present

## 2024-01-04 DIAGNOSIS — R2689 Other abnormalities of gait and mobility: Secondary | ICD-10-CM | POA: Diagnosis not present

## 2024-01-04 DIAGNOSIS — I10 Essential (primary) hypertension: Secondary | ICD-10-CM | POA: Diagnosis not present

## 2024-01-04 DIAGNOSIS — M6281 Muscle weakness (generalized): Secondary | ICD-10-CM | POA: Diagnosis not present

## 2024-01-04 DIAGNOSIS — I77810 Thoracic aortic ectasia: Secondary | ICD-10-CM | POA: Diagnosis not present

## 2024-01-05 DIAGNOSIS — R4701 Aphasia: Secondary | ICD-10-CM | POA: Diagnosis not present

## 2024-01-07 DIAGNOSIS — R4701 Aphasia: Secondary | ICD-10-CM | POA: Diagnosis not present

## 2024-01-07 DIAGNOSIS — I10 Essential (primary) hypertension: Secondary | ICD-10-CM | POA: Diagnosis not present

## 2024-01-07 DIAGNOSIS — M6281 Muscle weakness (generalized): Secondary | ICD-10-CM | POA: Diagnosis not present

## 2024-01-07 DIAGNOSIS — E441 Mild protein-calorie malnutrition: Secondary | ICD-10-CM | POA: Diagnosis not present

## 2024-01-07 DIAGNOSIS — E785 Hyperlipidemia, unspecified: Secondary | ICD-10-CM | POA: Diagnosis not present

## 2024-01-07 DIAGNOSIS — Q619 Cystic kidney disease, unspecified: Secondary | ICD-10-CM | POA: Diagnosis not present

## 2024-01-07 DIAGNOSIS — I77810 Thoracic aortic ectasia: Secondary | ICD-10-CM | POA: Diagnosis not present

## 2024-01-07 DIAGNOSIS — R2681 Unsteadiness on feet: Secondary | ICD-10-CM | POA: Diagnosis not present

## 2024-01-10 DIAGNOSIS — Q619 Cystic kidney disease, unspecified: Secondary | ICD-10-CM | POA: Diagnosis not present

## 2024-01-10 DIAGNOSIS — E441 Mild protein-calorie malnutrition: Secondary | ICD-10-CM | POA: Diagnosis not present

## 2024-01-10 DIAGNOSIS — I77810 Thoracic aortic ectasia: Secondary | ICD-10-CM | POA: Diagnosis not present

## 2024-01-10 DIAGNOSIS — M6281 Muscle weakness (generalized): Secondary | ICD-10-CM | POA: Diagnosis not present

## 2024-01-10 DIAGNOSIS — R2681 Unsteadiness on feet: Secondary | ICD-10-CM | POA: Diagnosis not present

## 2024-01-10 DIAGNOSIS — R4701 Aphasia: Secondary | ICD-10-CM | POA: Diagnosis not present

## 2024-01-10 DIAGNOSIS — E785 Hyperlipidemia, unspecified: Secondary | ICD-10-CM | POA: Diagnosis not present

## 2024-01-10 DIAGNOSIS — I10 Essential (primary) hypertension: Secondary | ICD-10-CM | POA: Diagnosis not present

## 2024-01-14 DIAGNOSIS — E785 Hyperlipidemia, unspecified: Secondary | ICD-10-CM | POA: Diagnosis not present

## 2024-01-14 DIAGNOSIS — R2689 Other abnormalities of gait and mobility: Secondary | ICD-10-CM | POA: Diagnosis not present

## 2024-01-14 DIAGNOSIS — I77810 Thoracic aortic ectasia: Secondary | ICD-10-CM | POA: Diagnosis not present

## 2024-01-14 DIAGNOSIS — I10 Essential (primary) hypertension: Secondary | ICD-10-CM | POA: Diagnosis not present

## 2024-01-14 DIAGNOSIS — Q619 Cystic kidney disease, unspecified: Secondary | ICD-10-CM | POA: Diagnosis not present

## 2024-01-14 DIAGNOSIS — M6281 Muscle weakness (generalized): Secondary | ICD-10-CM | POA: Diagnosis not present

## 2024-01-17 DIAGNOSIS — N390 Urinary tract infection, site not specified: Secondary | ICD-10-CM | POA: Diagnosis not present

## 2024-01-17 DIAGNOSIS — Q619 Cystic kidney disease, unspecified: Secondary | ICD-10-CM | POA: Diagnosis not present

## 2024-01-17 DIAGNOSIS — E785 Hyperlipidemia, unspecified: Secondary | ICD-10-CM | POA: Diagnosis not present

## 2024-01-17 DIAGNOSIS — I77810 Thoracic aortic ectasia: Secondary | ICD-10-CM | POA: Diagnosis not present

## 2024-01-17 DIAGNOSIS — E441 Mild protein-calorie malnutrition: Secondary | ICD-10-CM | POA: Diagnosis not present

## 2024-01-17 DIAGNOSIS — R2681 Unsteadiness on feet: Secondary | ICD-10-CM | POA: Diagnosis not present

## 2024-01-17 DIAGNOSIS — H409 Unspecified glaucoma: Secondary | ICD-10-CM | POA: Diagnosis not present

## 2024-01-17 DIAGNOSIS — R2689 Other abnormalities of gait and mobility: Secondary | ICD-10-CM | POA: Diagnosis not present

## 2024-01-17 DIAGNOSIS — M6281 Muscle weakness (generalized): Secondary | ICD-10-CM | POA: Diagnosis not present

## 2024-01-17 DIAGNOSIS — R4701 Aphasia: Secondary | ICD-10-CM | POA: Diagnosis not present

## 2024-01-17 DIAGNOSIS — I10 Essential (primary) hypertension: Secondary | ICD-10-CM | POA: Diagnosis not present

## 2024-01-31 DIAGNOSIS — J309 Allergic rhinitis, unspecified: Secondary | ICD-10-CM | POA: Diagnosis not present

## 2024-01-31 DIAGNOSIS — I77819 Aortic ectasia, unspecified site: Secondary | ICD-10-CM | POA: Diagnosis not present

## 2024-01-31 DIAGNOSIS — R1310 Dysphagia, unspecified: Secondary | ICD-10-CM | POA: Diagnosis not present

## 2024-01-31 DIAGNOSIS — E782 Mixed hyperlipidemia: Secondary | ICD-10-CM | POA: Diagnosis not present

## 2024-01-31 DIAGNOSIS — K5900 Constipation, unspecified: Secondary | ICD-10-CM | POA: Diagnosis not present

## 2024-01-31 DIAGNOSIS — E559 Vitamin D deficiency, unspecified: Secondary | ICD-10-CM | POA: Diagnosis not present

## 2024-01-31 DIAGNOSIS — M6281 Muscle weakness (generalized): Secondary | ICD-10-CM | POA: Diagnosis not present

## 2024-01-31 DIAGNOSIS — M810 Age-related osteoporosis without current pathological fracture: Secondary | ICD-10-CM | POA: Diagnosis not present

## 2024-01-31 DIAGNOSIS — E46 Unspecified protein-calorie malnutrition: Secondary | ICD-10-CM | POA: Diagnosis not present

## 2024-01-31 DIAGNOSIS — H409 Unspecified glaucoma: Secondary | ICD-10-CM | POA: Diagnosis not present

## 2024-01-31 DIAGNOSIS — I10 Essential (primary) hypertension: Secondary | ICD-10-CM | POA: Diagnosis not present

## 2024-02-02 ENCOUNTER — Emergency Department (HOSPITAL_COMMUNITY)
Admission: EM | Admit: 2024-02-02 | Discharge: 2024-02-02 | Disposition: A | Discharge status: Home / Self Care | Source: Skilled Nursing Facility | Attending: Emergency Medicine | Admitting: Emergency Medicine

## 2024-02-02 ENCOUNTER — Encounter (HOSPITAL_COMMUNITY): Payer: Self-pay | Admitting: Emergency Medicine

## 2024-02-02 ENCOUNTER — Emergency Department (HOSPITAL_COMMUNITY)

## 2024-02-02 ENCOUNTER — Other Ambulatory Visit: Payer: Self-pay

## 2024-02-02 DIAGNOSIS — S51012A Laceration without foreign body of left elbow, initial encounter: Secondary | ICD-10-CM | POA: Diagnosis not present

## 2024-02-02 DIAGNOSIS — W19XXXA Unspecified fall, initial encounter: Secondary | ICD-10-CM

## 2024-02-02 DIAGNOSIS — Z79899 Other long term (current) drug therapy: Secondary | ICD-10-CM | POA: Diagnosis not present

## 2024-02-02 DIAGNOSIS — S0990XA Unspecified injury of head, initial encounter: Secondary | ICD-10-CM | POA: Diagnosis not present

## 2024-02-02 DIAGNOSIS — M542 Cervicalgia: Secondary | ICD-10-CM | POA: Diagnosis not present

## 2024-02-02 DIAGNOSIS — Z96649 Presence of unspecified artificial hip joint: Secondary | ICD-10-CM | POA: Insufficient documentation

## 2024-02-02 DIAGNOSIS — I10 Essential (primary) hypertension: Secondary | ICD-10-CM | POA: Diagnosis not present

## 2024-02-02 DIAGNOSIS — R519 Headache, unspecified: Secondary | ICD-10-CM | POA: Diagnosis not present

## 2024-02-02 DIAGNOSIS — Z7982 Long term (current) use of aspirin: Secondary | ICD-10-CM | POA: Diagnosis not present

## 2024-02-02 DIAGNOSIS — M25522 Pain in left elbow: Secondary | ICD-10-CM | POA: Diagnosis present

## 2024-02-02 DIAGNOSIS — S0003XA Contusion of scalp, initial encounter: Secondary | ICD-10-CM | POA: Diagnosis not present

## 2024-02-02 DIAGNOSIS — I63521 Cerebral infarction due to unspecified occlusion or stenosis of right anterior cerebral artery: Secondary | ICD-10-CM | POA: Diagnosis not present

## 2024-02-02 MED ORDER — LIDOCAINE-EPINEPHRINE (PF) 2 %-1:200000 IJ SOLN
10.0000 mL | Freq: Once | INTRAMUSCULAR | Status: AC
  Administered 2024-02-02: 10 mL
  Filled 2024-02-02: qty 20

## 2024-02-02 MED ORDER — TETANUS-DIPHTH-ACELL PERTUSSIS 5-2.5-18.5 LF-MCG/0.5 IM SUSY
0.5000 mL | PREFILLED_SYRINGE | Freq: Once | INTRAMUSCULAR | Status: AC
  Administered 2024-02-02: 0.5 mL via INTRAMUSCULAR
  Filled 2024-02-02: qty 0.5

## 2024-02-02 NOTE — Discharge Instructions (Signed)
 It was a pleasure take care of you today.  Based on your history and physical exam I feel you are safe for discharge.  The scans of your head and neck today were negative for acute injury.  The skin laceration to your left elbow has been sutured with 3 nonabsorbable sutures.  The sutures will need to be removed within 5 to 7 days.  I recommend that she make your doctor [facility] aware of this and possibly they could be removed next Monday or one of the nursing staff could remove them.  Please continue to monitor your symptoms and make sure that you are being careful when walking with your walker.  If you experience any of the following symptoms including but not limited to severe head pain, neck pain, visual disturbances, nausea/vomiting, unexplained weakness, or other concerning symptom please return to the emergency department or seek further medical care.

## 2024-02-02 NOTE — ED Provider Notes (Signed)
  EMERGENCY DEPARTMENT AT [facility] Provider Note   CSN: [id number] Arrival date & time: 02/02/24  1645     Patient presents with: Tracey Holmes is a 89 y.o. female who presents to the  emergency department with a chief complaint of mechanical unwitnessed fall.  Patient lives at the [place] and was walking earlier today with her walker and her roommate had dropped her comb in the hallway.  Patient states that she bent over to pick up the comb and fell backwards onto the tile floor hitting the back of her head.  Patient denies loss of consciousness. Patient denies blood thinning medication.  Patient denies severe headache or neck pain.  She does appreciate mild left elbow pain and believes she has a cut to her left elbow.  Denies chest pain, shortness of breath, abdominal pain, nausea/vomiting, new visual disturbances, neck pain, extremity pain other than left elbow, hip pain.  Patient unaware when last tetanus shot was, per chart review looks to be older than 5 years.  Has medical history significant for hypertension, hyperlipidemia, thoracic aortic aneurysm, embolic stroke, PFO, DVT hip replacement, etc.    Fall       Prior to Admission medications   Medication Sig Start Date End Date Taking? Authorizing Provider  acetaminophen  (TYLENOL ) 500 MG tablet Take 500 mg by mouth 2 (two) times daily as needed (pain.).    [provider]  amLODipine  (NORVASC ) 5 MG tablet Take 5 mg by mouth in the morning. 12/29/10   [provider]  aspirin  EC 81 MG tablet Take 81 mg by mouth in the morning and at bedtime.    [provider]  brimonidine  (ALPHAGAN ) 0.2 % ophthalmic solution Place 1 drop into both eyes 3 (three) times daily. 10/26/19   [provider]  Cholecalciferol  (VITAMIN D ) 2000 UNITS tablet Take 2,000 Units by mouth in the morning.    [provider]  denosumab  (PROLIA ) 60 MG/ML SOLN injection Inject 60 mg into the  skin every 6 (six) months. Administer in upper arm, thigh, or abdomen    [provider]  dorzolamide -timolol  (COSOPT ) 22.3-6.8 MG/ML ophthalmic solution Place 1 drop into both eyes 2 (two) times daily.    [provider]  lactose free nutrition (BOOST) LIQD Take 237 mLs by mouth every other day.    [provider]  mirtazapine  (REMERON ) 15 MG tablet Take 15 mg by mouth at bedtime. 07/14/23   [provider]  Multiple Vitamin (MULTIVITAMIN WITH MINERALS) TABS tablet Take 1 tablet by mouth in the morning. One-A-Day    [provider]  Netarsudil -Latanoprost  (ROCKLATAN ) 0.02-0.005 % SOLN Place 1 drop into both eyes at bedtime. 03/22/20   [provider]  niacin  250 MG tablet Take 250 mg by mouth in the morning.    [provider]  Nutritional Supplements (CARNATION BREAKFAST ESSENTIALS) PACK Take 1 packet by mouth every other day.    [provider]  pravastatin  (PRAVACHOL ) 80 MG tablet Take 80 mg by mouth daily. 11/25/23   [provider]    Allergies: Benicar [olmesartan], Diovan [valsartan], Fosamax [alendronate], and Penicillins    Review of Systems  Skin:  Positive for wound (Small laceration present to left elbow, approximately 2 cm in length).    Updated Vital Signs BP (!) 144/77   Pulse 76   Temp 97.6 F (36.4 C) (Oral)   Resp 19   SpO2 93%   Physical Exam Vitals and nursing  note reviewed.  Constitutional:      General: She is awake. She is not in acute distress.    Appearance: Normal appearance. She is not ill-appearing, toxic-appearing or diaphoretic.  HENT:     Head:     Comments: No raccoon eyes or Battle sign, no tenderness with palpation of scalp or facial bones however small area of swelling noted to posterior scalp, no obvious lacerations or bruising of head or scalp Eyes:     General: No visual field deficit or scleral icterus.    Extraocular Movements: Extraocular movements intact.      Pupils: Pupils are equal, round, and reactive to light.     Comments: Vision grossly intact  Neck:     Comments: No cervical spine tenderness Cardiovascular:     Rate and Rhythm: Normal rate and regular rhythm.     Comments: No chest pain with palpation Pulmonary:     Effort: Pulmonary effort is normal. No respiratory distress.     Breath sounds: No wheezing, rhonchi or rales.  Abdominal:     General: Abdomen is flat. There is no distension.     Palpations: Abdomen is soft.     Tenderness: There is no abdominal tenderness. There is no right CVA tenderness, left CVA tenderness or guarding.  Musculoskeletal:        General: Normal range of motion.     Cervical back: Normal range of motion. No tenderness.     Right lower leg: No edema.     Left lower leg: No edema.     Comments: No rib pain with palpation, pelvis feels stable, no hip pain, no extremity pain other than surrounding laceration on left elbow, patient has good range of motion of bilateral shoulders, flexion extension of bilateral elbows, flexion extension of bilateral wrist, able to move all digits of both hands, patient able to flex and extend both knees, raise bilateral lower extremities up off the bed against resistance, no significant tenderness with palpation of cervical spine, thoracic spine, lumbar spine  Skin:    General: Skin is warm.     Capillary Refill: Capillary refill takes less than 2 seconds.     Comments: Laceration present to left elbow approximately 2 cm in length, bleeding controlled with only pressure, no sign of foreign body or bone  Neurological:     General: No focal deficit present.     Mental Status: She is alert and oriented to person, place, and time.     GCS: GCS eye subscore is 4. GCS verbal subscore is 5. GCS motor subscore is 6.     Cranial Nerves: No dysarthria or facial asymmetry.     Sensory: Sensation is intact.     Motor: Motor function is intact.  Psychiatric:        Mood and Affect: Mood  normal.        Behavior: Behavior normal. Behavior is cooperative.     (all labs ordered are listed, but only abnormal results are displayed) Labs Reviewed - No data to display  EKG: None  Radiology: CT Head Wo Contrast Result Date: 02/02/2024 CLINICAL DATA:  Unwitnessed fall neck pain EXAM: CT HEAD WITHOUT CONTRAST CT CERVICAL SPINE WITHOUT CONTRAST TECHNIQUE: Multidetector CT imaging of the head and cervical spine was performed following the standard protocol without intravenous contrast. Multiplanar CT image reconstructions of the cervical spine were also generated. RADIATION DOSE REDUCTION: This exam was performed according to the departmental dose-optimization program which includes automated exposure control, adjustment  of the mA and/or kV according to patient size and/or use of iterative reconstruction technique. COMPARISON:  CT brain and cervical spine 12/29/2023, MRI brain 09/02/2023 FINDINGS: CT HEAD FINDINGS Brain: No acute territorial infarction, hemorrhage or intracranial mass. Chronic left greater than right frontal lobe infarcts. Atrophy and chronic small vessel ischemic changes of the white matter. Stable ventricle size. Vascular: No hyperdense vessels.  No unexpected calcifications. Skull: Normal. Negative for fracture or focal lesion. Sinuses/Orbits: Mild mucosal thickening in the sinuses Other: Small right posterior scalp hematoma CT CERVICAL SPINE FINDINGS Alignment: Exaggerated cervical lordosis.  Facet alignment is normal Skull base and vertebrae: No acute fracture. No primary bone lesion or focal pathologic process. Soft tissues and spinal canal: No prevertebral fluid or swelling. No visible canal hematoma. Disc levels: Mild multilevel degenerative changes. No canal stenosis. Mild multilevel facet degenerative changes. Upper chest: Negative. Other: None IMPRESSION: 1. No CT evidence for acute intracranial abnormality. Atrophy and chronic small vessel ischemic changes of the white  matter. Chronic left greater than right frontal lobe infarcts. 2. Exaggerated cervical lordosis with mild degenerative changes. No acute osseous abnormality. Electronically Signed   By: Luke Bun M.D.   On: 02/02/2024 19:26   CT Cervical Spine Wo Contrast Result Date: 02/02/2024 CLINICAL DATA:  Unwitnessed fall neck pain EXAM: CT HEAD WITHOUT CONTRAST CT CERVICAL SPINE WITHOUT CONTRAST TECHNIQUE: Multidetector CT imaging of the head and cervical spine was performed following the standard protocol without intravenous contrast. Multiplanar CT image reconstructions of the cervical spine were also generated. RADIATION DOSE REDUCTION: This exam was performed according to the departmental dose-optimization program which includes automated exposure control, adjustment of the mA and/or kV according to patient size and/or use of iterative reconstruction technique. COMPARISON:  CT brain and cervical spine 12/29/2023, MRI brain 09/02/2023 FINDINGS: CT HEAD FINDINGS Brain: No acute territorial infarction, hemorrhage or intracranial mass. Chronic left greater than right frontal lobe infarcts. Atrophy and chronic small vessel ischemic changes of the white matter. Stable ventricle size. Vascular: No hyperdense vessels.  No unexpected calcifications. Skull: Normal. Negative for fracture or focal lesion. Sinuses/Orbits: Mild mucosal thickening in the sinuses Other: Small right posterior scalp hematoma CT CERVICAL SPINE FINDINGS Alignment: Exaggerated cervical lordosis.  Facet alignment is normal Skull base and vertebrae: No acute fracture. No primary bone lesion or focal pathologic process. Soft tissues and spinal canal: No prevertebral fluid or swelling. No visible canal hematoma. Disc levels: Mild multilevel degenerative changes. No canal stenosis. Mild multilevel facet degenerative changes. Upper chest: Negative. Other: None IMPRESSION: 1. No CT evidence for acute intracranial abnormality. Atrophy and chronic small vessel  ischemic changes of the white matter. Chronic left greater than right frontal lobe infarcts. 2. Exaggerated cervical lordosis with mild degenerative changes. No acute osseous abnormality. Electronically Signed   By: Luke Bun M.D.   On: 02/02/2024 19:26     .Laceration Repair  Date/Time: 02/02/2024 8:26 PM  Performed by: Janetta Terrall FALCON, PA-C Authorized by: Janetta Terrall FALCON, PA-C   Consent:    Consent obtained:  Verbal   Consent given by:  Patient   Risks, benefits, and alternatives were discussed: yes     Risks discussed:  Infection, pain, retained foreign body, need for additional repair, poor cosmetic result and poor wound healing   Alternatives discussed:  No treatment and observation Universal protocol:    Procedure explained and questions answered to patient or proxy's satisfaction: yes     Patient identity confirmed:  Verbally with patient and arm band  Anesthesia:    Anesthesia method:  Local infiltration   Local anesthetic:  Lidocaine  2% WITH epi Laceration details:    Location:  Shoulder/arm   Shoulder/arm location:  L elbow   Length (cm):  2 Exploration:    Limited defect created (wound extended): yes   Treatment:    Area cleansed with:  Povidone-iodine  and saline   Amount of cleaning:  Standard   Irrigation solution:  Sterile saline   Visualized foreign bodies/material removed: no   Skin repair:    Repair method:  Sutures   Suture size:  4-0   Suture material:  Nylon   Suture technique:  Simple interrupted   Number of sutures:  3 Approximation:    Approximation:  Close Repair type:    Repair type:  Simple Post-procedure details:    Dressing:  Non-adherent dressing   Procedure completion:  Tolerated well, no immediate complications    Medications Ordered in the ED  lidocaine -EPINEPHrine  (XYLOCAINE  W/EPI) 2 %-1:200000 (PF) injection 10 mL (10 mLs Infiltration Given by Other 02/02/24 1900)  Tdap (BOOSTRIX) injection 0.5 mL (0.5 mLs Intramuscular Given  02/02/24 1852)                                    Medical Decision Making Amount and/or Complexity of Data Reviewed Radiology: ordered.  Risk Prescription drug management.   Patient presents to the ED for concern of mechanical fall, head injury, laceration of left elbow, this involves an extensive number of treatment options, and is a complaint that carries with it a high risk of complications and morbidity.  The differential diagnosis includes skin tear, fracture, foreign body, laceration, brain bleed, facial fracture, skull fracture, cervical spine fracture, extremity fracture, etc.   Co morbidities that complicate the patient evaluation  hypertension, hyperlipidemia, thoracic aortic aneurysm, embolic stroke, PFO, DVT, hip replacement   Additional history obtained:  Additional history obtained from neighbor who is at bedside with patient permission   Imaging Studies ordered:  I ordered imaging studies including CT head and CT cervical spine I independently visualized and interpreted imaging which showed no acute intracranial or cervical spine abnormality, chronic changes noted I agree with the radiologist interpretation  Medicines ordered and prescription drug management:  I ordered medication including lidocaine  for suture repair, tetanus Reevaluation of the patient after these medicines showed that the patient improved I have reviewed the patients home medicines and have made adjustments as needed   Test Considered:  Imaging of pelvis: Declined at this time as pelvis feels stable, patient able to raise bilateral lower extremities up against resistance, low clinical suspicion for acute hip fracture or dislocation, also declined imaging of left elbow as patient has full range of motion of left elbow including flexion and extension without significant pain, low clinical suspicion for acute fracture, believe pain is due to laceration   Critical  Interventions:  None   Problem List / ED Course:  89 year old female, mechanical unwitnessed fall, no blood thinners, fell directly back onto head, complaining of very mild head pain as well as left elbow pain, small 2 cm laceration noted to left elbow, will need suture repair Tetanus updated On physical exam patient neurologically intact overall very well-appearing, no other significant musculoskeletal pain with palpation other than surrounding laceration of left elbow, pelvis feels stable, will plan for CT imaging of head and neck due to fall with head injury, I see no indication  for imaging of pelvis or hips at this time or other extremity Suture repair completed of left elbow laceration, 3 nonabsorbable sutures placed Imaging of head and neck reassuring Return precautions given Patient discharged Most likely diagnosis at this time is mechanical fall leading to head injury without acute findings on CT, laceration present to left elbow repaired with nonabsorbable sutures   Reevaluation:  After the interventions noted above, I reevaluated the patient and found that they have :improved   Social Determinants of Health:  Assisted Living patient    Dispostion:  After consideration of the diagnostic results and the patients response to treatment, I feel that the patient would benefit from discharge and outpatient therapy as described.     Final diagnoses:  Fall, initial encounter  Injury of head, initial encounter  Laceration of left elbow, initial encounter    ED Discharge Orders     None          Janetta Terrall JULIANNA DEVONNA 02/03/24 0102    Dean Clarity, MD 02/05/24 1551

## 2024-02-02 NOTE — ED Notes (Signed)
 Dressing applied to left elbow suture site, xeroform gauze, topped with rolled gauze, and covered with coban

## 2024-02-02 NOTE — ED Notes (Addendum)
 Pt with small lac to left elbow.   Pt denies any blood thinners.  Pt denies any N/V or blurry vision. Pt with knot to posterior of head.

## 2024-02-02 NOTE — ED Triage Notes (Signed)
 Pt bib ems from [facility]. Pt had unwitnessed fall. Picked up from floor by staff, pt was bending over trying to get something off the floor. Arrived a/o. Pt denies pain anywhere but has notable bump noted to right side of back of head and skin tear to left elbow that is wrapped. No vomiting. Pt states she remembers the fall. Moving all extremities.

## 2024-02-07 DIAGNOSIS — R42 Dizziness and giddiness: Secondary | ICD-10-CM | POA: Diagnosis not present

## 2024-02-07 DIAGNOSIS — S51012A Laceration without foreign body of left elbow, initial encounter: Secondary | ICD-10-CM | POA: Diagnosis not present

## 2024-02-07 DIAGNOSIS — M6281 Muscle weakness (generalized): Secondary | ICD-10-CM | POA: Diagnosis not present

## 2024-02-07 DIAGNOSIS — W19XXXA Unspecified fall, initial encounter: Secondary | ICD-10-CM | POA: Diagnosis not present

## 2024-02-07 DIAGNOSIS — I10 Essential (primary) hypertension: Secondary | ICD-10-CM | POA: Diagnosis not present

## 2024-02-07 DIAGNOSIS — S0990XA Unspecified injury of head, initial encounter: Secondary | ICD-10-CM | POA: Diagnosis not present

## 2024-02-10 DIAGNOSIS — K5904 Chronic idiopathic constipation: Secondary | ICD-10-CM | POA: Diagnosis not present

## 2024-02-10 DIAGNOSIS — M810 Age-related osteoporosis without current pathological fracture: Secondary | ICD-10-CM | POA: Diagnosis not present

## 2024-02-10 DIAGNOSIS — Z8673 Personal history of transient ischemic attack (TIA), and cerebral infarction without residual deficits: Secondary | ICD-10-CM | POA: Diagnosis not present

## 2024-02-10 DIAGNOSIS — I10 Essential (primary) hypertension: Secondary | ICD-10-CM | POA: Diagnosis not present

## 2024-02-10 DIAGNOSIS — H409 Unspecified glaucoma: Secondary | ICD-10-CM | POA: Diagnosis not present

## 2024-02-10 DIAGNOSIS — R42 Dizziness and giddiness: Secondary | ICD-10-CM | POA: Diagnosis not present

## 2024-02-14 DIAGNOSIS — R1310 Dysphagia, unspecified: Secondary | ICD-10-CM | POA: Diagnosis not present

## 2024-02-14 DIAGNOSIS — R42 Dizziness and giddiness: Secondary | ICD-10-CM | POA: Diagnosis not present

## 2024-02-28 DIAGNOSIS — K5900 Constipation, unspecified: Secondary | ICD-10-CM | POA: Diagnosis not present

## 2024-02-28 DIAGNOSIS — J309 Allergic rhinitis, unspecified: Secondary | ICD-10-CM | POA: Diagnosis not present

## 2024-02-28 DIAGNOSIS — I10 Essential (primary) hypertension: Secondary | ICD-10-CM | POA: Diagnosis not present
